# Patient Record
Sex: Female | Born: 1970 | Race: White | Hispanic: No | Marital: Married | Smoking: Never smoker
Health system: Southern US, Community
[De-identification: ages and names within clinical notes are randomized; demographics above are authoritative.]

## PROBLEM LIST (undated history)

## (undated) DIAGNOSIS — E785 Hyperlipidemia, unspecified: Secondary | ICD-10-CM

## (undated) DIAGNOSIS — I639 Cerebral infarction, unspecified: Secondary | ICD-10-CM

## (undated) DIAGNOSIS — I10 Essential (primary) hypertension: Secondary | ICD-10-CM

## (undated) HISTORY — DX: Hyperlipidemia, unspecified: E78.5

## (undated) HISTORY — DX: Essential (primary) hypertension: I10

## (undated) HISTORY — DX: Cerebral infarction, unspecified: I63.9

---

## 1997-08-20 ENCOUNTER — Ambulatory Visit (HOSPITAL_COMMUNITY): Admission: RE | Admit: 1997-08-20 | Discharge: 1997-08-21 | Payer: Self-pay | Admitting: General Surgery

## 1999-03-04 ENCOUNTER — Other Ambulatory Visit: Admission: RE | Admit: 1999-03-04 | Discharge: 1999-03-04 | Payer: Self-pay | Admitting: *Deleted

## 1999-12-30 ENCOUNTER — Ambulatory Visit (HOSPITAL_COMMUNITY): Admission: RE | Admit: 1999-12-30 | Discharge: 1999-12-30 | Payer: Self-pay | Admitting: *Deleted

## 2003-03-21 ENCOUNTER — Other Ambulatory Visit: Admission: RE | Admit: 2003-03-21 | Discharge: 2003-03-21 | Payer: Self-pay | Admitting: *Deleted

## 2003-07-08 ENCOUNTER — Emergency Department (HOSPITAL_COMMUNITY): Admission: EM | Admit: 2003-07-08 | Discharge: 2003-07-08 | Payer: Self-pay

## 2004-05-03 ENCOUNTER — Other Ambulatory Visit: Admission: RE | Admit: 2004-05-03 | Discharge: 2004-05-03 | Payer: Self-pay | Admitting: Obstetrics and Gynecology

## 2006-01-05 ENCOUNTER — Ambulatory Visit: Payer: Self-pay | Admitting: Gastroenterology

## 2006-01-13 ENCOUNTER — Ambulatory Visit: Payer: Self-pay | Admitting: Cardiology

## 2006-02-17 ENCOUNTER — Ambulatory Visit (HOSPITAL_COMMUNITY): Admission: RE | Admit: 2006-02-17 | Discharge: 2006-02-17 | Payer: Self-pay | Admitting: Obstetrics and Gynecology

## 2006-03-02 ENCOUNTER — Ambulatory Visit: Payer: Self-pay | Admitting: Gastroenterology

## 2006-09-12 ENCOUNTER — Ambulatory Visit: Payer: Self-pay | Admitting: Gastroenterology

## 2007-05-05 DIAGNOSIS — E538 Deficiency of other specified B group vitamins: Secondary | ICD-10-CM | POA: Insufficient documentation

## 2007-05-05 DIAGNOSIS — E7800 Pure hypercholesterolemia, unspecified: Secondary | ICD-10-CM | POA: Insufficient documentation

## 2007-05-05 DIAGNOSIS — Z8719 Personal history of other diseases of the digestive system: Secondary | ICD-10-CM | POA: Insufficient documentation

## 2008-06-19 DIAGNOSIS — E663 Overweight: Secondary | ICD-10-CM | POA: Insufficient documentation

## 2009-04-13 ENCOUNTER — Ambulatory Visit: Payer: Self-pay | Admitting: Family Medicine

## 2009-04-20 LAB — CONVERTED CEMR LAB
Hemoglobin: 12.4 g/dL (ref 12.0–15.0)
Lymphocytes Relative: 45.1 % (ref 12.0–46.0)
Lymphs Abs: 3.8 10*3/uL (ref 0.7–4.0)
MCHC: 34.1 g/dL (ref 30.0–36.0)
Neutro Abs: 4.1 10*3/uL (ref 1.4–7.7)
RDW: 12 % (ref 11.5–14.6)

## 2009-12-28 ENCOUNTER — Ambulatory Visit: Payer: Self-pay | Admitting: Family Medicine

## 2010-01-01 LAB — CONVERTED CEMR LAB
Albumin: 4.3 g/dL (ref 3.5–5.2)
Cholesterol: 222 mg/dL — ABNORMAL HIGH (ref 0–200)
Direct LDL: 154 mg/dL
Total Bilirubin: 0.2 mg/dL — ABNORMAL LOW (ref 0.3–1.2)
Total CHOL/HDL Ratio: 6
Triglycerides: 189 mg/dL — ABNORMAL HIGH (ref 0.0–149.0)
VLDL: 37.8 mg/dL (ref 0.0–40.0)

## 2010-01-28 ENCOUNTER — Telehealth: Payer: Self-pay | Admitting: Family Medicine

## 2010-03-04 NOTE — Progress Notes (Signed)
Summary: rx lomotil  Phone Note Call from Patient   Caller: Patient Summary of Call: requesting lomotil for diarrhea  Initial call taken by: Pura Spice, RN,  January 28, 2010 11:10 AM  Follow-up for Phone Call        ok per dr Scotty Court  called Follow-up by: Pura Spice, RN,  January 28, 2010 11:10 AM    New/Updated Medications: LOMOTIL 2.5-0.025 MG TABS (DIPHENOXYLATE-ATROPINE) 1 by mouth two times a day for diarrhea Prescriptions: LOMOTIL 2.5-0.025 MG TABS (DIPHENOXYLATE-ATROPINE) 1 by mouth two times a day for diarrhea  #100 x 1   Entered by:   Pura Spice, RN   Authorized by:   Judithann Sheen MD   Signed by:   Pura Spice, RN on 01/28/2010   Method used:   Telephoned to ...       [facility] [address] (retail)       [address]       [place], [place]  [address]       Ph: [phone]       Fax: [phone]   RxID:   [id number]

## 2010-06-15 NOTE — Assessment & Plan Note (Signed)
[facility]                         GASTROENTEROLOGY OFFICE NOTE   NAME:[name], MAHAYLA [name]                      MRN:          [id number]  DATE:09/12/2006                            DOB:          07/04/1970    [name] is doing well on Pamine twice a day and is really having minimal  GI complaints.  She continues to have some urgency and episodes of  diarrhea.  Her GI workup has been entirely negative, but she has not had  colonoscopy.  She is status post cholecystectomy in the year 2000.   PHYSICAL EXAMINATION:  VITAL SIGNS:  All stable.  General physical exam is not repeated.   ASSESSMENT:  I think most of her problems are related to rapid  intestinal transit and irritable bowel syndrome.  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  Cause for a B12 deficiency is unclear.   RECOMMENDATIONS:  1. Check anti-intrinsic factor and anti-parietal cell antibodies.  2. Continue B12 replacement therapy.  3. Trial of Colestid 1 g a day.  See if this helps with her GI      symptoms.  If so will ask her to try to taper her off of her Pamine      medication.  4. GI followup in several weeks' time or p.r.n. as needed.     Vania Rea. Jarold Motto, MD, Caleen Essex, FAGA  Electronically Signed    DRP/MedQ  DD: 09/12/2006  DT: 09/13/2006  Job #: [id number]

## 2010-06-18 NOTE — Op Note (Signed)
NAMETEREN, FRANCKOWIAK               ACCOUNT NO.:  [id number]   MEDICAL RECORD NO.:  [id number]          PATIENT TYPE:  AMB   LOCATION:  SDC                           FACILITY:  [facility]   PHYSICIAN:  Guy Sandifer. Henderson Cloud, M.D. DATE OF BIRTH:  18-Mar-1970   DATE OF PROCEDURE:  02/17/2006  DATE OF DISCHARGE:                               OPERATIVE REPORT   PREOPERATIVE DIAGNOSIS:  Dysmenorrhea.   POSTOPERATIVE DIAGNOSIS:  Dysmenorrhea.   PROCEDURE:  Laparoscopy.   SURGEON:  Guy Sandifer. Henderson Cloud, M.D.   ANESTHESIA:  General endotracheal intubation.   ESTIMATED BLOOD LOSS:  Drops.   INDICATIONS AND CONSENT:  This patient is a 40 year old married white  female G2, P2, status post tubal ligation with increasingly severe  premenstrual pain and pain with her menses.  Details are dictated in the  history and physical.  Laparoscopy is discussed preoperatively.  Potential risks and complications are reviewed preoperatively including  but limited to infection, bowel, bladder, ureteral damage, bleeding  requiring transfusion of blood products with possible transfusion  reaction, HIV and hepatitis acquisition, DVT, PE, pneumonia, laparotomy,  recurrent pelvic pain and dyspareunia.  All questions have been answered  and consent is signed on the chart.   FINDINGS:  Upper abdomen is grossly normal.  Uterus is normal in size  and contour.  Tubes are status post ligation bilaterally.  Ovaries are  normal.  Anterior and posterior cul-de-sacs and pelvic sidewalls are  normal.   PROCEDURE:  The patient is taken to the operating room where she is  identified, placed dorsal supine position and general anesthesia is  induced via endotracheal intubation.  She is then placed in dorsal  lithotomy position where she is prepped abdominally and vaginally and  bladder straight catheterized.  Hulka tenaculum is placed in uterus as a  manipulated and she is draped in a sterile fashion.  Infraumbilical and  suprapubic  areas are injected in the midline with 0.50% plain Marcaine.  Small infraumbilical incision is made and disposable Veress needle is  placed.  A normal syringe and drop test are noted.  Two  liters of gas  are insufflated under low pressure with good tympany in the right upper  quadrant.  Veress needle is removed and a 10-11 XL bladeless disposable  trocar sleeve is placed under direct visualization with a diagnostic  laparoscopic.  Diagnostic laparoscopic is then replaced with the  operative laparoscopic.  Small suprapubic incision is made in the  midline and a 5 mm XL bladeless disposable trocar sleeve is  placed  under direct visualization without difficulty.  The above findings are  noted.  Suprapubic trocar sleeve is removed and good hemostasis is  noted.  Pneumoperitoneum is reduced.  Umbilical trocar sleeve is  removed.  The umbilical incision is closed with subcuticular 3-0 Vicryl  suture.  Dermabond is placed on both incisions.  Hulka tenaculum is  removed and no bleeding is noted.  All counts are correct.  The patient  is awakened and taken to the recovery room in stable condition.      Guy Sandifer Henderson Cloud, M.D.  Electronically Signed     JET/MEDQ  D:  02/17/2006  T:  02/17/2006  Job:  [id number]

## 2010-06-18 NOTE — H&P (Signed)
[name], [name]               ACCOUNT NO.:  [id number]   MEDICAL RECORD NO.:  [id number]          PATIENT TYPE:  AMB   LOCATION:  SDC                           [facility]ILITY:  [facility]   PHYSICIAN:  Guy Sandifer. Henderson Cloud, M.D. DATE OF BIRTH:  [birth year]   DATE OF ADMISSION:  02/17/2006  DATE OF DISCHARGE:                              HISTORY & PHYSICAL   CHIEF COMPLAINT:  Pelvic pain with her menses.   HISTORY OF PRESENT ILLNESS:  This patient is a 40 year old married white  female, gravida 2, para 2 status post tubal ligation with increasingly  severe premenstrual pain and pain with her menses.  The pain starts one  to two weeks prior to her menses each month.  She also has loose stools  and stomach cramps with her menses.  After a discussion of the options,  she is being admitted for laparoscopy.  Potential risks and  complications have been discussed preoperatively.   PAST MEDICAL HISTORY:  Migraine headaches.   PAST SURGICAL HISTORY:  1. Cholecystectomy in 1999.  2. Laparoscopic tubal ligation in 2001.   FAMILY HISTORY:  Diabetes in mother and brother.  Breast cancer in  paternal grandmother.  Chronic hypertension in both parents.  Heart  disease in father.  Rheumatic fever in father.   SOCIAL HISTORY:  The patient denies tobacco, alcohol or drug abuse.   MEDICATIONS:  1. B-12 supplementation  2. Unknown GI medication   ALLERGIES:  No known drug allergies.   REVIEW OF SYSTEMS:  NEUROLOGIC: History of headaches as above.  GASTROINTESTINAL: Denies recent changes in bowel habits except as above.  PULMONARY: Denies shortness of breath.  CARDIAC: Denies chest pain.   PHYSICAL EXAMINATION:  VITAL SIGNS: Height 5 feet 3 inches, weight 185.8  pounds.  Blood pressure 138/90.  HEENT: Without thyromegaly.  LUNGS: Clear to auscultation.  HEART: Regular rate and rhythm.  BACK: Without costovertebral angle tenderness.  BREASTS: Without masses, retractions,  or discharge.  ABDOMEN: Soft,  nontender, without masses.  PELVIC: Both vagina and cervix without lesions.  Uterus is anteverted  upper normal size, mobile, nontender.  Adnexa nontender and without  masses.  EXTREMITIES: Grossly within normal limits.  NEUROLOGICAL: Grossly within normal limits.   ASSESSMENT:  Premenstrual pain and dysmenorrhea.   PLAN:  Laparoscopy.      Guy Sandifer Henderson Cloud, M.D.  Electronically Signed     JET/MEDQ  D:  02/15/2006  T:  02/15/2006  Job:  [id number]

## 2010-06-18 NOTE — H&P (Signed)
[facility]  Patient:    [name]                      MRN: [id number] Adm. Date:  [id number] Attending:  Donne Hazel                         History and Physical  HISTORY OF PRESENT ILLNESS:   [name] is a 40 year old female, gravida 2, para 2, admitted for permanent tubal sterilization.  The patient was thoroughly regarding the risks and benefits of permanent sterilization and strongly wished to proceed.  She is currently on oral contraception.  She has had two term vaginal deliveries and at this time requests permanent tubal sterilization.  PAST MEDICAL HISTORY:         None.  PAST SURGICAL HISTORY:        Cholecystectomy in 1999.  OBSTETRICAL HISTORY:          Normal spontaneous vaginal delivery x 2 at term.  CURRENT MEDICATIONS:          Ortho Tri-Cyclen.  ALLERGIES:                    None known.  PHYSICAL EXAMINATION:         Vital signs stable, temperature 97.2 degrees, pulse 75, respirations 20, blood pressure 133/77.  GENERAL APPEARANCE:           She is a well-developed, well-nourished female in no acute distress.  HEENT:                        Within normal limits.  NECK:                         Supple without adenopathy or thyromegaly.  HEART:                        Regular rate and rhythm without murmurs, rubs, or gallops.  LUNGS:                        Clear to auscultation.  BREASTS:                      Exam done recently in the office was normal with no masses, nipple discharge, or axillary adenopathy.  However, this was deferred upon admission.  ABDOMEN:                      Soft and nontender without masses, tenderness, hernia, or organomegaly.  EXTREMITIES:                  Grossly normal.  NEUROLOGIC:                   Grossly normal.  PELVIC:                       Normal external female genitalia.  Vagina and cervix clear.  The uterus is small, nontender, and mobile.  The adnexa  are clear.  LABORATORY DATA:              Urine pregnancy test negative.  ADMISSION DIAGNOSIS:          Requests permanent, voluntary sterilization.  PLAN:  Laparoscopic bilateral tubal ligation.  DISCUSSION:                   The risks and benefits of this surgery were explained to the patient.  The risks of bleeding, infection, and risks of injury to surrounding organs reviewed.  Also, failure rate of 1:200 discussed with the patient at length.  The patient was allowed to ask questions and wished to proceed with tubal ligation. DD:  12/30/99 TD:  12/30/99 Job: [id number]

## 2010-06-18 NOTE — Assessment & Plan Note (Signed)
[facility]                            CARDIOLOGY OFFICE NOTE   NAME:[name], [name]                      MRN:          [id number]  DATE:01/13/2006                            DOB:          [birth year]    I was asked by Dr. Harold Hedge to evaluate [name], a delightful  40 year old married white female with hyperlipidemia, obesity, sedentary  lifestyle and a family history of coronary disease in both her mother  and father.   She is totally asymptomatic.  She has been very fatigued and is  currently receiving B12 injections for what sounds like pernicious  anemia or B12 deficiency.  She says she is already feeling better.  She  is ready to start an exercise program.   She is asymptomatic at present.   Lipids from May 14, 2004, showed a total cholesterol of 211, LDLC of  153, HDL 32, triglycerides of 131.  The LDL particles were very high at  2288, small LDL particles high at 1527, LDL particle size large at 20.7,  large HDL was intermediate range.  Her fasting blood sugar at that time  was normal, hemoglobin A1c was 5.4.  I do not have more recent values.   Her mother and father both have coronary disease in their 67's.  Her  mother specifically had an MI at age 56.   PAST MEDICAL HISTORY:  She does not smoke, drink or use any recreational  drugs.   CURRENT MEDS:  1. Pamine Forte 5 mg.  2. B12 injection.   SURGERY:  Cholecystectomy.   SOCIAL HISTORY:  She is a CMA medical assist.  She is married and has 2  children.   REVIEW OF SYSTEMS:  Negative other than problems with allergies and hay  fever, chronic anemia and menstrual dysfunction.   EXAM TODAY:  She is very pleasant.  Her blood pressure is 124/84, pulse  is 84 and regular.  She is 5 feet 3 inches, weighs 188.  She is in no  acute distress.  Skin is warm and dry.  Skin is slightly pale.  HEENT:  Normocephalic, atraumatic.  PERRLA.  Extraocular movements  intact.  Sclera  clear.  Facial symmetry is normal.  Dentition is  satisfactory.  Carotids are full without bruits.  There is no JVD.  Thyroid is not enlarged.  Trachea is midline.  NECK:  Supple.  LUNGS:  Clear to auscultation and percussion.  HEART:  Reveals a regular rate and rhythm with a nondisplaced PMI.  She  has normal S1, S2.  ABDOMINAL EXAM:  Soft with good bowel sounds.  EXTREMITIES:  No cyanosis, clubbing or edema.  Pulses are brisk.  NEURO EXAM:  Intact.  MUSCULOSKELETAL:  Intact.  SKIN:  Intact.   Electrocardiogram is normal except for an RSR prime in V1 consistent  with an incomplete right bundle.   ASSESSMENT AND PLAN:  Because of her young age, she is still at very low  risk of having a cardiovascular event in the next 10 years.  However, I  emphasized to her that I think she is really  headed down the wrong road  for future events.  With her family history being a major factor, her  hyperlipidemia and her weight and her sedentary lifestyle become more  and more factors as she gets older.   She seems very motivated and wants to get into an exercise program.  I  have recommended the following:  1. Three hours of cardio activity each week.  2. Weight reduction of 4 pounds per month for a total of 30 pounds and      maintain.  3. See me back in 6 months.  At that time will repeat labs and decide      if she needs hyperlipidemic therapy.   She seems very comfortable with this approach.     Thomas C. Daleen Squibb, MD, [facility]  Electronically Signed    TCW/MedQ  DD: 01/13/2006  DT: 01/13/2006  Job #: [id number]   cc:   Guy Sandifer. Henderson Cloud, M.D.

## 2010-06-18 NOTE — Assessment & Plan Note (Signed)
[facility]                         GASTROENTEROLOGY OFFICE NOTE   NAME:Mesick, MADALENE MICKLER                      MRN:          [id number]  DATE:01/05/2006                            DOB:          07/22/1970    HISTORY OF PRESENT ILLNESS:  Ms. 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   Over the last year, Ms. Gasca has had a crampy lower abdominal pain,  mostly in the right lower quadrant with explosive diarrhea, usually  after her main meal of the day at lunch.  She has been using Imodium  with good response.  She has some other scattered attacks of minor  abdominal cramping and diarrhea throughout the day, but has had no  nocturnal diarrhea, self soiling, melena or hematochezia, anorexia,  weight loss or any systemic complaints whatsoever.  She has tried  elimination diets and has not noticed any specific food substance which  gives her diarrhea.  Denies lactose intolerance or history of sorbitol  or fructose abuse.   In 2000, she had an incidental cholecystectomy performed.  Apparently  had diarrhea after that and was placed on a low fat diet by Dr. Karma Ganja with good response.  She has not been on recent antibiotics or  had any new medication added to her regimen.  She does have  endometriosis and has painful lower abdominal pain before her menstrual  periods.  Is due for laparoscopic exam this coming January.  This pain  does not awaken her from sleep.  There has been no associated fever,  chills, skin rashes, joint pains, oral stomatitis, etc, etc.  She has  not had stool exams or any laboratory tests or any endoscopic or barium  studies of her bowels performed.  She is of [place] descent.   PAST MEDICAL HISTORY:  Otherwise, remarkable for chronic headaches for  which she uses over-the-counter NSAIDS.  She has had a previous tubal  ligation.   FAMILY  HISTORY:  Remarkable for atherosclerosis of multiple members.  Paternal grandmother with breast cancer, and multiple members with  diabetes.  No known gastrointestinal problems.   SOCIAL HISTORY:  She is married, lives with her husband and two  children.  She works as Clinical biochemist at [employer].  She does not  smoke or use Ethanol.   REVIEW OF SYSTEMS:  Otherwise, noncontributory.  Her last menstrual  period was December 08, 2005.   MEDICATIONS:  1. Multivitamins.  2. Calcium.  3. She has been off birth control pills for the last 6-7 years.   PHYSICAL EXAMINATION:  GENERAL:  She is a healthy appearing white female  in no acute distress.  She is 5 feet 3 inches tall, weighs 186 pounds.  VITAL SIGNS:  Blood pressure 122/80, pulse 82 and regular.  I could not  appreciate stigmata or chronic liver disease, and there was no  thyromegaly or lymphadenopathy.  CHEST:  Entirely clear.  I could not appreciate murmurs, gallops or  rubs.  She appeared to be in irregular rhythm.  ABDOMEN:  I could not appreciate hepatosplenomegaly, abdominal masses or  tenderness.  Bowel sounds were normal.  EXTREMITIES:  Unremarkable.  NEUROLOGICAL:  Mental status was clear.  RECTAL:  No masses or tenderness.  Stool is guaiac negative.   ASSESSMENT:  Ms. Enlow has diarrhea which sounds like a colitic  process.  Diagnostic possibilities include irritable bowel syndrome  versus inflammatory bowel disease.   RECOMMENDATIONS:  1. Check routine labs and stool exams.  2. Trial of Robinul Forte 5 mg 30 minutes before main meal of the day      and every 12 hours as needed.  3. GI follow up in two weeks time to review her results and clinical      course, decide if colonoscopy is indicated.     Vania Rea. Jarold Motto, MD, [facility]aleen Essex, FAGA  Electronically Signed    DRP/MedQ  DD: 01/05/2006  DT: 01/05/2006  Job #: [id number]   cc:   Guy Sandifer. Henderson Cloud, M.D.

## 2010-06-18 NOTE — Op Note (Signed)
[facility]  Patient:    [name], [name]                      MRN: [id number] Proc. Date: 12/30/99 Adm. Date:  [id number] Attending:  Donne Hazel                           Operative Report  PREOPERATIVE DIAGNOSIS:  Requests permanent, voluntary sterilization.  POSTOPERATIVE DIAGNOSIS:  Requests permanent, voluntary sterilization.  OPERATION:  Laparoscopic bilateral tubal ligation by electrofulguration  SURGEON:  R. Alan Mulder, M.D.  ANESTHESIA:  General endotracheal.  ESTIMATED BLOOD LOSS:  Less than 20 cc.  COMPLICATIONS:  None.  FINDINGS:  Normal pelvis.  DESCRIPTION OF PROCEDURE:  The patient was taken to the operating room where a general endotracheal anesthetic was administered.  The patient was placed on the operating table in the dorsal lithotomy position.  The abdomen, perineum and vagina was prepped and draped in the usual sterile fashion with Betadine and sterile drapes.  The bladder was emptied with a red rubber catheter.  A Hulka tenaculum was placed inside the intrauterine cavity.  Next, small transverse infraumbilical skin incision was made through which a Verres needle was inserted atraumatically into the abdominal cavity.  A pneumoperitoneum was then created with 2.5 liters of carbon dioxide gas.  The Verres needle was then removed, and a disposable laparoscopic trocar was inserted into the abdominal cavity.  A laparoscope was then placed with the above noted findings.  First, the left tube was traced to its fimbriated end to ensure its positive identification.  The tube was then grasped approximately 2 cm away from the uterine fundus and cauterized thoroughly.  An approximately 2.5 cm segment of tube was thoroughly cauterized with some of the mesosalpinx being incorporated into this cauterization process.  This was done thoroughly with no current being passed through the paddles of the _________ device.  The same  procedure was then repeated on the right tube.  Good hemostasis was noted.  Representative pictures were then taken of bilateral tubal ligation.  Attention was then turned to closure.  All abdominal instruments were removed and the gas allowed to completely escape from the abdomen.  The small infraumbilical skin incision was closed first with three interrupted sutures of 0 Vicryl on the muscle fascia.  The skin was then reapproximated with three interrupted sutures of 4-0 Vicryl Rapide.  Then 10 cc of 0.25% Marcaine was then infiltrated into the incision for comfort.  All vaginal instruments were removed.  The patient was awakened, extubated and taken to the recovery room in good condition.  There were no perioperative conditions. DD:  12/30/99 TD:  12/30/99 Job: [id number]

## 2012-04-04 ENCOUNTER — Ambulatory Visit
Admission: RE | Admit: 2012-04-04 | Discharge: 2012-04-04 | Disposition: A | Discharge status: Home / Self Care | Payer: Federal, State, Local not specified - PPO | Source: Ambulatory Visit | Attending: Family Medicine | Admitting: Family Medicine

## 2012-04-04 ENCOUNTER — Other Ambulatory Visit: Payer: Self-pay | Admitting: Family Medicine

## 2012-04-04 DIAGNOSIS — R05 Cough: Secondary | ICD-10-CM

## 2012-04-04 DIAGNOSIS — R059 Cough, unspecified: Secondary | ICD-10-CM

## 2015-08-14 DIAGNOSIS — E785 Hyperlipidemia, unspecified: Secondary | ICD-10-CM | POA: Diagnosis not present

## 2015-08-14 DIAGNOSIS — D519 Vitamin B12 deficiency anemia, unspecified: Secondary | ICD-10-CM | POA: Diagnosis not present

## 2015-08-14 DIAGNOSIS — E119 Type 2 diabetes mellitus without complications: Secondary | ICD-10-CM | POA: Diagnosis not present

## 2015-08-14 DIAGNOSIS — I10 Essential (primary) hypertension: Secondary | ICD-10-CM | POA: Diagnosis not present

## 2015-11-13 DIAGNOSIS — Z01419 Encounter for gynecological examination (general) (routine) without abnormal findings: Secondary | ICD-10-CM | POA: Diagnosis not present

## 2015-11-13 DIAGNOSIS — Z6834 Body mass index (BMI) 34.0-34.9, adult: Secondary | ICD-10-CM | POA: Diagnosis not present

## 2015-11-23 DIAGNOSIS — Z1231 Encounter for screening mammogram for malignant neoplasm of breast: Secondary | ICD-10-CM | POA: Diagnosis not present

## 2016-05-12 DIAGNOSIS — Z0000 Encounter for general adult medical examination without abnormal findings: Secondary | ICD-10-CM | POA: Diagnosis not present

## 2016-05-12 DIAGNOSIS — D519 Vitamin B12 deficiency anemia, unspecified: Secondary | ICD-10-CM | POA: Diagnosis not present

## 2016-05-12 DIAGNOSIS — R5383 Other fatigue: Secondary | ICD-10-CM | POA: Diagnosis not present

## 2016-05-12 DIAGNOSIS — E119 Type 2 diabetes mellitus without complications: Secondary | ICD-10-CM | POA: Diagnosis not present

## 2016-05-12 DIAGNOSIS — E785 Hyperlipidemia, unspecified: Secondary | ICD-10-CM | POA: Diagnosis not present

## 2016-05-12 DIAGNOSIS — I10 Essential (primary) hypertension: Secondary | ICD-10-CM | POA: Diagnosis not present

## 2016-08-11 DIAGNOSIS — E559 Vitamin D deficiency, unspecified: Secondary | ICD-10-CM | POA: Diagnosis not present

## 2017-01-12 DIAGNOSIS — E119 Type 2 diabetes mellitus without complications: Secondary | ICD-10-CM | POA: Diagnosis not present

## 2017-01-12 DIAGNOSIS — I10 Essential (primary) hypertension: Secondary | ICD-10-CM | POA: Diagnosis not present

## 2017-01-12 DIAGNOSIS — E559 Vitamin D deficiency, unspecified: Secondary | ICD-10-CM | POA: Diagnosis not present

## 2017-01-12 DIAGNOSIS — E785 Hyperlipidemia, unspecified: Secondary | ICD-10-CM | POA: Diagnosis not present

## 2017-04-06 DIAGNOSIS — Z01419 Encounter for gynecological examination (general) (routine) without abnormal findings: Secondary | ICD-10-CM | POA: Diagnosis not present

## 2017-04-06 DIAGNOSIS — Z1231 Encounter for screening mammogram for malignant neoplasm of breast: Secondary | ICD-10-CM | POA: Diagnosis not present

## 2017-04-06 DIAGNOSIS — Z6834 Body mass index (BMI) 34.0-34.9, adult: Secondary | ICD-10-CM | POA: Diagnosis not present

## 2017-04-06 DIAGNOSIS — Z780 Asymptomatic menopausal state: Secondary | ICD-10-CM | POA: Diagnosis not present

## 2017-05-23 DIAGNOSIS — J029 Acute pharyngitis, unspecified: Secondary | ICD-10-CM | POA: Diagnosis not present

## 2017-08-31 DIAGNOSIS — E7800 Pure hypercholesterolemia, unspecified: Secondary | ICD-10-CM | POA: Diagnosis not present

## 2017-08-31 DIAGNOSIS — E119 Type 2 diabetes mellitus without complications: Secondary | ICD-10-CM | POA: Diagnosis not present

## 2017-08-31 DIAGNOSIS — I10 Essential (primary) hypertension: Secondary | ICD-10-CM | POA: Diagnosis not present

## 2017-08-31 DIAGNOSIS — D519 Vitamin B12 deficiency anemia, unspecified: Secondary | ICD-10-CM | POA: Diagnosis not present

## 2018-07-23 DIAGNOSIS — G43909 Migraine, unspecified, not intractable, without status migrainosus: Secondary | ICD-10-CM | POA: Diagnosis not present

## 2018-07-23 DIAGNOSIS — E7800 Pure hypercholesterolemia, unspecified: Secondary | ICD-10-CM | POA: Diagnosis not present

## 2018-07-23 DIAGNOSIS — I10 Essential (primary) hypertension: Secondary | ICD-10-CM | POA: Diagnosis not present

## 2018-07-23 DIAGNOSIS — E119 Type 2 diabetes mellitus without complications: Secondary | ICD-10-CM | POA: Diagnosis not present

## 2018-12-10 DIAGNOSIS — Z01419 Encounter for gynecological examination (general) (routine) without abnormal findings: Secondary | ICD-10-CM | POA: Diagnosis not present

## 2018-12-10 DIAGNOSIS — Z1231 Encounter for screening mammogram for malignant neoplasm of breast: Secondary | ICD-10-CM | POA: Diagnosis not present

## 2018-12-10 DIAGNOSIS — Z6833 Body mass index (BMI) 33.0-33.9, adult: Secondary | ICD-10-CM | POA: Diagnosis not present

## 2019-01-23 ENCOUNTER — Telehealth: Payer: Self-pay | Admitting: *Deleted

## 2019-01-23 NOTE — Telephone Encounter (Signed)
[name] from [facility] called for patient;s address.

## 2019-05-20 DIAGNOSIS — I10 Essential (primary) hypertension: Secondary | ICD-10-CM | POA: Diagnosis not present

## 2019-05-20 DIAGNOSIS — E119 Type 2 diabetes mellitus without complications: Secondary | ICD-10-CM | POA: Diagnosis not present

## 2019-05-20 DIAGNOSIS — G43909 Migraine, unspecified, not intractable, without status migrainosus: Secondary | ICD-10-CM | POA: Diagnosis not present

## 2019-05-20 DIAGNOSIS — E7800 Pure hypercholesterolemia, unspecified: Secondary | ICD-10-CM | POA: Diagnosis not present

## 2019-11-25 DIAGNOSIS — I10 Essential (primary) hypertension: Secondary | ICD-10-CM | POA: Diagnosis not present

## 2019-11-25 DIAGNOSIS — E559 Vitamin D deficiency, unspecified: Secondary | ICD-10-CM | POA: Diagnosis not present

## 2019-11-25 DIAGNOSIS — Z0000 Encounter for general adult medical examination without abnormal findings: Secondary | ICD-10-CM | POA: Diagnosis not present

## 2019-11-25 DIAGNOSIS — E7800 Pure hypercholesterolemia, unspecified: Secondary | ICD-10-CM | POA: Diagnosis not present

## 2019-11-25 DIAGNOSIS — E119 Type 2 diabetes mellitus without complications: Secondary | ICD-10-CM | POA: Diagnosis not present

## 2019-11-25 DIAGNOSIS — Z23 Encounter for immunization: Secondary | ICD-10-CM | POA: Diagnosis not present

## 2020-04-06 DIAGNOSIS — Z01812 Encounter for preprocedural laboratory examination: Secondary | ICD-10-CM | POA: Diagnosis not present

## 2020-04-09 DIAGNOSIS — Z1211 Encounter for screening for malignant neoplasm of colon: Secondary | ICD-10-CM | POA: Diagnosis not present

## 2020-04-20 DIAGNOSIS — Z01419 Encounter for gynecological examination (general) (routine) without abnormal findings: Secondary | ICD-10-CM | POA: Diagnosis not present

## 2020-04-20 DIAGNOSIS — Z1231 Encounter for screening mammogram for malignant neoplasm of breast: Secondary | ICD-10-CM | POA: Diagnosis not present

## 2020-10-19 DIAGNOSIS — E119 Type 2 diabetes mellitus without complications: Secondary | ICD-10-CM | POA: Diagnosis not present

## 2020-10-19 DIAGNOSIS — I10 Essential (primary) hypertension: Secondary | ICD-10-CM | POA: Diagnosis not present

## 2020-10-19 DIAGNOSIS — E7800 Pure hypercholesterolemia, unspecified: Secondary | ICD-10-CM | POA: Diagnosis not present

## 2020-10-19 DIAGNOSIS — G43909 Migraine, unspecified, not intractable, without status migrainosus: Secondary | ICD-10-CM | POA: Diagnosis not present

## 2021-01-23 ENCOUNTER — Emergency Department (HOSPITAL_COMMUNITY): Payer: Federal, State, Local not specified - PPO

## 2021-01-23 ENCOUNTER — Other Ambulatory Visit: Payer: Self-pay

## 2021-01-23 ENCOUNTER — Emergency Department (HOSPITAL_COMMUNITY): Payer: Federal, State, Local not specified - PPO | Admitting: Registered Nurse

## 2021-01-23 ENCOUNTER — Encounter (HOSPITAL_COMMUNITY)
Admission: EM | Disposition: A | Discharge status: Home / Self Care | Payer: Self-pay | Source: Home / Self Care | Attending: Neurology

## 2021-01-23 ENCOUNTER — Inpatient Hospital Stay (HOSPITAL_COMMUNITY): Payer: Federal, State, Local not specified - PPO

## 2021-01-23 ENCOUNTER — Inpatient Hospital Stay (HOSPITAL_COMMUNITY)
Admission: EM | Admit: 2021-01-23 | Discharge: 2021-01-29 | DRG: 023 | Disposition: A | Discharge status: Home / Self Care | Payer: Federal, State, Local not specified - PPO | Attending: Neurology | Admitting: Neurology

## 2021-01-23 DIAGNOSIS — R0689 Other abnormalities of breathing: Secondary | ICD-10-CM | POA: Diagnosis not present

## 2021-01-23 DIAGNOSIS — G8191 Hemiplegia, unspecified affecting right dominant side: Secondary | ICD-10-CM | POA: Diagnosis present

## 2021-01-23 DIAGNOSIS — I6523 Occlusion and stenosis of bilateral carotid arteries: Secondary | ICD-10-CM | POA: Diagnosis not present

## 2021-01-23 DIAGNOSIS — E669 Obesity, unspecified: Secondary | ICD-10-CM | POA: Diagnosis present

## 2021-01-23 DIAGNOSIS — Z789 Other specified health status: Secondary | ICD-10-CM

## 2021-01-23 DIAGNOSIS — R29810 Facial weakness: Secondary | ICD-10-CM | POA: Diagnosis present

## 2021-01-23 DIAGNOSIS — I63512 Cerebral infarction due to unspecified occlusion or stenosis of left middle cerebral artery: Secondary | ICD-10-CM

## 2021-01-23 DIAGNOSIS — Z95828 Presence of other vascular implants and grafts: Secondary | ICD-10-CM | POA: Diagnosis not present

## 2021-01-23 DIAGNOSIS — Z832 Family history of diseases of the blood and blood-forming organs and certain disorders involving the immune mechanism: Secondary | ICD-10-CM

## 2021-01-23 DIAGNOSIS — I10 Essential (primary) hypertension: Secondary | ICD-10-CM | POA: Diagnosis not present

## 2021-01-23 DIAGNOSIS — I82431 Acute embolism and thrombosis of right popliteal vein: Secondary | ICD-10-CM | POA: Diagnosis present

## 2021-01-23 DIAGNOSIS — Z833 Family history of diabetes mellitus: Secondary | ICD-10-CM | POA: Diagnosis not present

## 2021-01-23 DIAGNOSIS — R220 Localized swelling, mass and lump, head: Secondary | ICD-10-CM | POA: Diagnosis not present

## 2021-01-23 DIAGNOSIS — I609 Nontraumatic subarachnoid hemorrhage, unspecified: Secondary | ICD-10-CM | POA: Diagnosis not present

## 2021-01-23 DIAGNOSIS — T45515A Adverse effect of anticoagulants, initial encounter: Secondary | ICD-10-CM | POA: Diagnosis not present

## 2021-01-23 DIAGNOSIS — R29818 Other symptoms and signs involving the nervous system: Secondary | ICD-10-CM | POA: Diagnosis not present

## 2021-01-23 DIAGNOSIS — I82462 Acute embolism and thrombosis of left calf muscular vein: Secondary | ICD-10-CM | POA: Diagnosis not present

## 2021-01-23 DIAGNOSIS — R739 Hyperglycemia, unspecified: Secondary | ICD-10-CM | POA: Diagnosis not present

## 2021-01-23 DIAGNOSIS — I6602 Occlusion and stenosis of left middle cerebral artery: Secondary | ICD-10-CM | POA: Diagnosis present

## 2021-01-23 DIAGNOSIS — Z20822 Contact with and (suspected) exposure to covid-19: Secondary | ICD-10-CM | POA: Diagnosis present

## 2021-01-23 DIAGNOSIS — R404 Transient alteration of awareness: Secondary | ICD-10-CM | POA: Diagnosis not present

## 2021-01-23 DIAGNOSIS — I639 Cerebral infarction, unspecified: Secondary | ICD-10-CM | POA: Diagnosis present

## 2021-01-23 DIAGNOSIS — I82463 Acute embolism and thrombosis of calf muscular vein, bilateral: Secondary | ICD-10-CM | POA: Diagnosis not present

## 2021-01-23 DIAGNOSIS — I6389 Other cerebral infarction: Secondary | ICD-10-CM | POA: Diagnosis not present

## 2021-01-23 DIAGNOSIS — R29718 NIHSS score 18: Secondary | ICD-10-CM | POA: Diagnosis not present

## 2021-01-23 DIAGNOSIS — Z823 Family history of stroke: Secondary | ICD-10-CM

## 2021-01-23 DIAGNOSIS — E1165 Type 2 diabetes mellitus with hyperglycemia: Secondary | ICD-10-CM | POA: Diagnosis present

## 2021-01-23 DIAGNOSIS — Z6830 Body mass index (BMI) 30.0-30.9, adult: Secondary | ICD-10-CM

## 2021-01-23 DIAGNOSIS — I63412 Cerebral infarction due to embolism of left middle cerebral artery: Principal | ICD-10-CM | POA: Diagnosis present

## 2021-01-23 DIAGNOSIS — Q2112 Patent foramen ovale: Secondary | ICD-10-CM | POA: Diagnosis not present

## 2021-01-23 DIAGNOSIS — R531 Weakness: Secondary | ICD-10-CM

## 2021-01-23 DIAGNOSIS — I82403 Acute embolism and thrombosis of unspecified deep veins of lower extremity, bilateral: Secondary | ICD-10-CM

## 2021-01-23 DIAGNOSIS — R4701 Aphasia: Secondary | ICD-10-CM | POA: Diagnosis not present

## 2021-01-23 DIAGNOSIS — E785 Hyperlipidemia, unspecified: Secondary | ICD-10-CM | POA: Diagnosis present

## 2021-01-23 DIAGNOSIS — E11649 Type 2 diabetes mellitus with hypoglycemia without coma: Secondary | ICD-10-CM | POA: Diagnosis not present

## 2021-01-23 DIAGNOSIS — E876 Hypokalemia: Secondary | ICD-10-CM | POA: Diagnosis not present

## 2021-01-23 DIAGNOSIS — I63312 Cerebral infarction due to thrombosis of left middle cerebral artery: Secondary | ICD-10-CM | POA: Diagnosis not present

## 2021-01-23 DIAGNOSIS — M419 Scoliosis, unspecified: Secondary | ICD-10-CM | POA: Diagnosis not present

## 2021-01-23 DIAGNOSIS — E7800 Pure hypercholesterolemia, unspecified: Secondary | ICD-10-CM | POA: Diagnosis not present

## 2021-01-23 DIAGNOSIS — I82433 Acute embolism and thrombosis of popliteal vein, bilateral: Secondary | ICD-10-CM | POA: Diagnosis not present

## 2021-01-23 HISTORY — PX: IR PERCUTANEOUS ART THROMBECTOMY/INFUSION INTRACRANIAL INC DIAG ANGIO: IMG6087

## 2021-01-23 HISTORY — PX: RADIOLOGY WITH ANESTHESIA: SHX6223

## 2021-01-23 HISTORY — PX: IR CT HEAD LTD: IMG2386

## 2021-01-23 LAB — RESP PANEL BY RT-PCR (FLU A&B, COVID) ARPGX2
Influenza A by PCR: NEGATIVE
Influenza B by PCR: NEGATIVE
SARS Coronavirus 2 by RT PCR: NEGATIVE

## 2021-01-23 LAB — POCT I-STAT 7, (LYTES, BLD GAS, ICA,H+H)
Acid-base deficit: 2 mmol/L (ref 0.0–2.0)
Bicarbonate: 24 mmol/L (ref 20.0–28.0)
Calcium, Ion: 1.18 mmol/L (ref 1.15–1.40)
HCT: 34 % — ABNORMAL LOW (ref 36.0–46.0)
Hemoglobin: 11.6 g/dL — ABNORMAL LOW (ref 12.0–15.0)
O2 Saturation: 97 %
Patient temperature: 98.9
Potassium: 3.3 mmol/L — ABNORMAL LOW (ref 3.5–5.1)
Sodium: 140 mmol/L (ref 135–145)
TCO2: 25 mmol/L (ref 22–32)
pCO2 arterial: 44.4 mmHg (ref 32.0–48.0)
pH, Arterial: 7.341 — ABNORMAL LOW (ref 7.350–7.450)
pO2, Arterial: 103 mmHg (ref 83.0–108.0)

## 2021-01-23 LAB — DIFFERENTIAL
Abs Immature Granulocytes: 0.05 10*3/uL (ref 0.00–0.07)
Basophils Absolute: 0.1 10*3/uL (ref 0.0–0.1)
Basophils Relative: 1 %
Eosinophils Absolute: 0.1 10*3/uL (ref 0.0–0.5)
Eosinophils Relative: 1 %
Immature Granulocytes: 1 %
Lymphocytes Relative: 45 %
Lymphs Abs: 4.9 10*3/uL — ABNORMAL HIGH (ref 0.7–4.0)
Monocytes Absolute: 0.7 10*3/uL (ref 0.1–1.0)
Monocytes Relative: 6 %
Neutro Abs: 4.9 10*3/uL (ref 1.7–7.7)
Neutrophils Relative %: 46 %

## 2021-01-23 LAB — APTT: aPTT: 24 seconds (ref 24–36)

## 2021-01-23 LAB — CBC
HCT: 39.6 % (ref 36.0–46.0)
Hemoglobin: 13.5 g/dL (ref 12.0–15.0)
MCH: 31.8 pg (ref 26.0–34.0)
MCHC: 34.1 g/dL (ref 30.0–36.0)
MCV: 93.4 fL (ref 80.0–100.0)
Platelets: 236 K/uL (ref 150–400)
RBC: 4.24 MIL/uL (ref 3.87–5.11)
RDW: 11.6 % (ref 11.5–15.5)
WBC: 10.7 K/uL — ABNORMAL HIGH (ref 4.0–10.5)
nRBC: 0 % (ref 0.0–0.2)

## 2021-01-23 LAB — PROTIME-INR
INR: 1 (ref 0.8–1.2)
Prothrombin Time: 12.9 seconds (ref 11.4–15.2)

## 2021-01-23 LAB — COMPREHENSIVE METABOLIC PANEL
ALT: 26 U/L (ref 0–44)
AST: 25 U/L (ref 15–41)
Albumin: 3.9 g/dL (ref 3.5–5.0)
Alkaline Phosphatase: 69 U/L (ref 38–126)
Anion gap: 10 (ref 5–15)
BUN: 13 mg/dL (ref 6–20)
CO2: 26 mmol/L (ref 22–32)
Calcium: 9.7 mg/dL (ref 8.9–10.3)
Chloride: 97 mmol/L — ABNORMAL LOW (ref 98–111)
Creatinine, Ser: 0.72 mg/dL (ref 0.44–1.00)
GFR, Estimated: >60 mL/min (ref >60)
Glucose, Bld: 388 mg/dL — ABNORMAL HIGH (ref 70–99)
Potassium: 4.2 mmol/L (ref 3.5–5.1)
Sodium: 133 mmol/L — ABNORMAL LOW (ref 135–145)
Total Bilirubin: 0.8 mg/dL (ref 0.3–1.2)
Total Protein: 6.9 g/dL (ref 6.5–8.1)

## 2021-01-23 LAB — I-STAT CHEM 8, ED
BUN: 16 mg/dL (ref 6–20)
Calcium, Ion: 1.13 mmol/L — ABNORMAL LOW (ref 1.15–1.40)
Chloride: 98 mmol/L (ref 98–111)
Creatinine, Ser: 0.5 mg/dL (ref 0.44–1.00)
Glucose, Bld: 385 mg/dL — ABNORMAL HIGH (ref 70–99)
HCT: 40 % (ref 36.0–46.0)
Hemoglobin: 13.6 g/dL (ref 12.0–15.0)
Potassium: 4.3 mmol/L (ref 3.5–5.1)
Sodium: 135 mmol/L (ref 135–145)
TCO2: 27 mmol/L (ref 22–32)

## 2021-01-23 LAB — I-STAT BETA HCG BLOOD, ED (MC, WL, AP ONLY): I-stat hCG, quantitative: <5 m[IU]/mL

## 2021-01-23 LAB — GLUCOSE, CAPILLARY
Glucose-Capillary: 233 mg/dL — ABNORMAL HIGH (ref 70–99)
Glucose-Capillary: 303 mg/dL — ABNORMAL HIGH (ref 70–99)
Glucose-Capillary: 324 mg/dL — ABNORMAL HIGH (ref 70–99)

## 2021-01-23 LAB — MRSA NEXT GEN BY PCR, NASAL: MRSA by PCR Next Gen: NOT DETECTED

## 2021-01-23 LAB — CBG MONITORING, ED: Glucose-Capillary: 373 mg/dL — ABNORMAL HIGH (ref 70–99)

## 2021-01-23 SURGERY — IR WITH ANESTHESIA
Anesthesia: General

## 2021-01-23 MED ORDER — POLYETHYLENE GLYCOL 3350 17 G PO PACK: 17.0000 g | PACK | Freq: Every day | ORAL | Status: DC

## 2021-01-23 MED ORDER — CANGRELOR TETRASODIUM 50 MG IV SOLR
INTRAVENOUS | Status: AC
  Filled 2021-01-23: qty 50

## 2021-01-23 MED ORDER — SODIUM CHLORIDE 0.9 % IV SOLN: INTRAVENOUS | Status: DC

## 2021-01-23 MED ORDER — CLOPIDOGREL BISULFATE 300 MG PO TABS
ORAL_TABLET | ORAL | Status: AC
  Filled 2021-01-23: qty 1

## 2021-01-23 MED ORDER — IOHEXOL 350 MG/ML SOLN
75.0000 mL | Freq: Once | INTRAVENOUS | Status: AC
  Administered 2021-01-23: 15:00:00 75 mL via INTRAVENOUS

## 2021-01-23 MED ORDER — PHENYLEPHRINE 40 MCG/ML (10ML) SYRINGE FOR IV PUSH (FOR BLOOD PRESSURE SUPPORT)
PREFILLED_SYRINGE | INTRAVENOUS | Status: DC | PRN
  Administered 2021-01-23: 40 ug via INTRAVENOUS

## 2021-01-23 MED ORDER — IOHEXOL 300 MG/ML  SOLN
100.0000 mL | Freq: Once | INTRAMUSCULAR | Status: AC | PRN
  Administered 2021-01-23: 18:00:00 20 mL via INTRA_ARTERIAL

## 2021-01-23 MED ORDER — CHLORHEXIDINE GLUCONATE 0.12% ORAL RINSE (MEDLINE KIT)
15.0000 mL | Freq: Two times a day (BID) | OROMUCOSAL | Status: DC
  Administered 2021-01-23 – 2021-01-28 (×8): 15 mL via OROMUCOSAL

## 2021-01-23 MED ORDER — SODIUM CHLORIDE 0.9% FLUSH
3.0000 mL | Freq: Once | INTRAVENOUS | Status: AC
  Administered 2021-01-23: 19:00:00 3 mL via INTRAVENOUS

## 2021-01-23 MED ORDER — ONDANSETRON HCL 4 MG/2ML IJ SOLN
INTRAMUSCULAR | Status: DC | PRN
  Administered 2021-01-23: 4 mg via INTRAVENOUS

## 2021-01-23 MED ORDER — CLEVIDIPINE BUTYRATE 0.5 MG/ML IV EMUL
INTRAVENOUS | Status: AC
  Filled 2021-01-23: qty 50

## 2021-01-23 MED ORDER — SUCCINYLCHOLINE CHLORIDE 200 MG/10ML IV SOSY
PREFILLED_SYRINGE | INTRAVENOUS | Status: DC | PRN
  Administered 2021-01-23: 100 mg via INTRAVENOUS

## 2021-01-23 MED ORDER — SODIUM CHLORIDE 0.9 % IV SOLN: INTRAVENOUS | Status: DC | PRN

## 2021-01-23 MED ORDER — CLEVIDIPINE BUTYRATE 0.5 MG/ML IV EMUL
INTRAVENOUS | Status: DC | PRN
  Administered 2021-01-23: 2 mg/h via INTRAVENOUS

## 2021-01-23 MED ORDER — ASPIRIN 81 MG PO CHEW
CHEWABLE_TABLET | ORAL | Status: AC
  Filled 2021-01-23: qty 1

## 2021-01-23 MED ORDER — CHLORHEXIDINE GLUCONATE 0.12% ORAL RINSE (MEDLINE KIT)
15.0000 mL | Freq: Two times a day (BID) | OROMUCOSAL | Status: DC
  Administered 2021-01-23: 20:00:00 15 mL via OROMUCOSAL

## 2021-01-23 MED ORDER — CEFAZOLIN SODIUM-DEXTROSE 2-3 GM-%(50ML) IV SOLR
INTRAVENOUS | Status: DC | PRN
  Administered 2021-01-23: 2 g via INTRAVENOUS

## 2021-01-23 MED ORDER — ORAL CARE MOUTH RINSE
15.0000 mL | OROMUCOSAL | Status: DC
  Administered 2021-01-23 – 2021-01-24 (×5): 15 mL via OROMUCOSAL

## 2021-01-23 MED ORDER — PROPOFOL 1000 MG/100ML IV EMUL
5.0000 ug/kg/min | INTRAVENOUS | Status: DC
  Administered 2021-01-23: 19:00:00 50 ug/kg/min via INTRAVENOUS
  Administered 2021-01-23: 23:00:00 41 ug/kg/min via INTRAVENOUS
  Filled 2021-01-23 (×4): qty 100

## 2021-01-23 MED ORDER — CLEVIDIPINE BUTYRATE 0.5 MG/ML IV EMUL
0.0000 mg/h | INTRAVENOUS | Status: AC
  Administered 2021-01-23: 19:00:00 14 mg/h via INTRAVENOUS
  Administered 2021-01-24: 08:00:00 7 mg/h via INTRAVENOUS
  Filled 2021-01-23 (×3): qty 50

## 2021-01-23 MED ORDER — ACETAMINOPHEN 650 MG RE SUPP: 650.0000 mg | RECTAL | Status: DC | PRN

## 2021-01-23 MED ORDER — INSULIN REGULAR(HUMAN) IN NACL 100-0.9 UT/100ML-% IV SOLN
INTRAVENOUS | Status: AC
  Administered 2021-01-23: 17:00:00 23 [IU]/h via INTRAVENOUS
  Filled 2021-01-23: qty 100

## 2021-01-23 MED ORDER — TICAGRELOR 90 MG PO TABS
ORAL_TABLET | ORAL | Status: AC
  Filled 2021-01-23: qty 2

## 2021-01-23 MED ORDER — DOCUSATE SODIUM 50 MG/5ML PO LIQD: 100.0000 mg | Freq: Two times a day (BID) | ORAL | Status: DC

## 2021-01-23 MED ORDER — ACETAMINOPHEN 325 MG PO TABS
650.0000 mg | ORAL_TABLET | ORAL | Status: DC | PRN
  Administered 2021-01-26 – 2021-01-27 (×6): 650 mg via ORAL
  Filled 2021-01-23 (×5): qty 2

## 2021-01-23 MED ORDER — INSULIN ASPART 100 UNIT/ML IJ SOLN
0.0000 [IU] | INTRAMUSCULAR | Status: DC
  Administered 2021-01-23 (×2): 15 [IU] via SUBCUTANEOUS
  Administered 2021-01-24 (×2): 7 [IU] via SUBCUTANEOUS
  Administered 2021-01-24: 13:00:00 4 [IU] via SUBCUTANEOUS

## 2021-01-23 MED ORDER — ACETAMINOPHEN 325 MG PO TABS: 650.0000 mg | ORAL_TABLET | ORAL | Status: DC | PRN

## 2021-01-23 MED ORDER — STROKE: EARLY STAGES OF RECOVERY BOOK: Freq: Once | Status: AC

## 2021-01-23 MED ORDER — EPTIFIBATIDE 20 MG/10ML IV SOLN
INTRAVENOUS | Status: AC
  Filled 2021-01-23: qty 10

## 2021-01-23 MED ORDER — LABETALOL HCL 5 MG/ML IV SOLN
20.0000 mg | Freq: Once | INTRAVENOUS | Status: AC
  Administered 2021-01-23 (×2): 5 mg via INTRAVENOUS
  Administered 2021-01-23: 18:00:00 10 mg via INTRAVENOUS

## 2021-01-23 MED ORDER — TENECTEPLASE FOR STROKE
0.2500 mg/kg | PACK | Freq: Once | INTRAVENOUS | Status: AC
  Administered 2021-01-23: 15:00:00 22 mg via INTRAVENOUS
  Filled 2021-01-23: qty 10

## 2021-01-23 MED ORDER — NITROGLYCERIN 1 MG/10 ML FOR IR/CATH LAB
INTRA_ARTERIAL | Status: AC
  Administered 2021-01-23: 17:00:00 50 ug via INTRA_ARTERIAL
  Administered 2021-01-23: 17:00:00 25 ug via INTRA_ARTERIAL
  Filled 2021-01-23: qty 10

## 2021-01-23 MED ORDER — ROCURONIUM BROMIDE 10 MG/ML (PF) SYRINGE
PREFILLED_SYRINGE | INTRAVENOUS | Status: DC | PRN
  Administered 2021-01-23: 40 mg via INTRAVENOUS
  Administered 2021-01-23: 20 mg via INTRAVENOUS
  Administered 2021-01-23: 50 mg via INTRAVENOUS

## 2021-01-23 MED ORDER — FENTANYL CITRATE PF 50 MCG/ML IJ SOSY: 50.0000 ug | PREFILLED_SYRINGE | INTRAMUSCULAR | Status: DC | PRN

## 2021-01-23 MED ORDER — SODIUM CHLORIDE 0.9 % IV BOLUS
500.0000 mL | Freq: Once | INTRAVENOUS | Status: AC
  Administered 2021-01-23: 21:00:00 500 mL via INTRAVENOUS

## 2021-01-23 MED ORDER — FENTANYL CITRATE PF 50 MCG/ML IJ SOSY
50.0000 ug | PREFILLED_SYRINGE | INTRAMUSCULAR | Status: DC | PRN
Start: 2021-01-23 — End: 2021-01-25
  Administered 2021-01-23: 20:00:00 100 ug via INTRAVENOUS
  Administered 2021-01-24: 03:00:00 50 ug via INTRAVENOUS
  Filled 2021-01-23: qty 2
  Filled 2021-01-23: qty 1

## 2021-01-23 MED ORDER — ACETAMINOPHEN 160 MG/5ML PO SOLN: 650.0000 mg | ORAL | Status: DC | PRN

## 2021-01-23 MED ORDER — SENNOSIDES-DOCUSATE SODIUM 8.6-50 MG PO TABS: 1.0000 | ORAL_TABLET | Freq: Two times a day (BID) | ORAL | Status: DC

## 2021-01-23 MED ORDER — FENTANYL CITRATE (PF) 100 MCG/2ML IJ SOLN: 50.0000 ug | INTRAMUSCULAR | Status: DC | PRN

## 2021-01-23 MED ORDER — CLEVIDIPINE BUTYRATE 0.5 MG/ML IV EMUL: 0.0000 mg/h | INTRAVENOUS | Status: DC

## 2021-01-23 MED ORDER — PHENYLEPHRINE HCL-NACL 20-0.9 MG/250ML-% IV SOLN
INTRAVENOUS | Status: DC | PRN
  Administered 2021-01-23: 15 ug/min via INTRAVENOUS

## 2021-01-23 MED ORDER — ONDANSETRON HCL 4 MG/2ML IJ SOLN
4.0000 mg | Freq: Four times a day (QID) | INTRAMUSCULAR | Status: DC | PRN
  Administered 2021-01-27: 15:00:00 4 mg via INTRAVENOUS
  Filled 2021-01-23 (×2): qty 2

## 2021-01-23 MED ORDER — SUGAMMADEX SODIUM 200 MG/2ML IV SOLN
INTRAVENOUS | Status: DC | PRN
  Administered 2021-01-23: 200 mg via INTRAVENOUS

## 2021-01-23 MED ORDER — CHLORHEXIDINE GLUCONATE CLOTH 2 % EX PADS
6.0000 | MEDICATED_PAD | Freq: Every day | CUTANEOUS | Status: DC
  Administered 2021-01-23 – 2021-01-28 (×6): 6 via TOPICAL

## 2021-01-23 MED ORDER — PANTOPRAZOLE SODIUM 40 MG IV SOLR
40.0000 mg | Freq: Every day | INTRAVENOUS | Status: DC
  Administered 2021-01-23 – 2021-01-24 (×2): 40 mg via INTRAVENOUS
  Filled 2021-01-23 (×2): qty 40

## 2021-01-23 MED ORDER — CEFAZOLIN SODIUM-DEXTROSE 2-4 GM/100ML-% IV SOLN
INTRAVENOUS | Status: AC
  Filled 2021-01-23: qty 100

## 2021-01-23 MED ORDER — LIDOCAINE 2% (20 MG/ML) 5 ML SYRINGE
INTRAMUSCULAR | Status: DC | PRN
  Administered 2021-01-23: 100 mg via INTRAVENOUS

## 2021-01-23 MED ORDER — ESMOLOL HCL 100 MG/10ML IV SOLN
INTRAVENOUS | Status: DC | PRN
  Administered 2021-01-23: 30 mg via INTRAVENOUS
  Administered 2021-01-23: 20 mg via INTRAVENOUS
  Administered 2021-01-23 (×2): 30 mg via INTRAVENOUS

## 2021-01-23 MED ORDER — ORAL CARE MOUTH RINSE
15.0000 mL | OROMUCOSAL | Status: DC
  Administered 2021-01-23 – 2021-01-24 (×7): 15 mL via OROMUCOSAL

## 2021-01-23 MED ORDER — VERAPAMIL HCL 2.5 MG/ML IV SOLN
INTRAVENOUS | Status: AC
  Filled 2021-01-23: qty 2

## 2021-01-23 MED ORDER — DEXAMETHASONE SODIUM PHOSPHATE 10 MG/ML IJ SOLN
INTRAMUSCULAR | Status: DC | PRN
  Administered 2021-01-23: 5 mg via INTRAVENOUS

## 2021-01-23 MED ORDER — INSULIN REGULAR(HUMAN) IN NACL 100-0.9 UT/100ML-% IV SOLN: INTRAVENOUS | Status: DC

## 2021-01-23 MED ORDER — DEXMEDETOMIDINE HCL IN NACL 400 MCG/100ML IV SOLN: 0.0000 ug/kg/h | INTRAVENOUS | Status: DC

## 2021-01-23 MED ORDER — PROPOFOL 500 MG/50ML IV EMUL
INTRAVENOUS | Status: DC | PRN
  Administered 2021-01-23: 50 ug/kg/min via INTRAVENOUS

## 2021-01-23 MED ORDER — PROPOFOL 10 MG/ML IV BOLUS
INTRAVENOUS | Status: DC | PRN
  Administered 2021-01-23: 150 mg via INTRAVENOUS

## 2021-01-23 MED ORDER — IOHEXOL 300 MG/ML  SOLN
100.0000 mL | Freq: Once | INTRAMUSCULAR | Status: AC | PRN
  Administered 2021-01-23: 18:00:00 50 mL via INTRA_ARTERIAL

## 2021-01-23 NOTE — Progress Notes (Signed)
PT Cancellation Note  Patient Details Name: [name] MRN: [id number] DOB: 07-06-1970   Cancelled Treatment:    Reason Eval/Treat Not Completed: Active bedrest order. Pt arrived as a Code Stroke and was administered TNK dose around 1511. Bedrest orders active. Will plan to follow-up another day as appropriate.  Raymond Gurney, PT, DPT Acute Rehabilitation Services  Pager: [phone] Office: [phone]    Jewel Baize 01/23/2021, 4:04 PM

## 2021-01-23 NOTE — Code Documentation (Signed)
Stroke Response Nurse Documentation Code Documentation  SYANNA REMMERT is a 50 y.o. female arriving to [facility] ED via [facility] on 01/23/2021 with past medical hx of HTN, DM II, HLD. On No antithrombotic. Code stroke was activated by EMS.   Patient from home where she was LKW at 1400 and now complaining of left gaze, right sided weakness, and right facial droop. Pt was sitting watching football with her husband when he noticed a sudden change in her behavior. She began pointing at things and not speaking.  Stroke team at the bedside on patient arrival. Labs drawn and patient cleared for CT by Dr. Rhunette Croft. Patient to CT with team. NIHSS 22, see documentation for details and code stroke times. Patient with disoriented, not following commands, left gaze preference , right hemianopia, right facial droop, right arm weakness, right leg weakness, global aphasia, dysarthria, and neglect on exam. The following imaging was completed:  CT, CTA head and neck. Patient is a candidate for IV Thrombolytic. Patient is a candidate for IR.   Care/Plan: TNK given at 1512, pt taken to IR with team.  Bedside handoff with IR RN Renea Ee.    Barnabas Henriques L Janya Eveland  Rapid Response RN

## 2021-01-23 NOTE — H&P (Addendum)
Neurology H&P  CC: code stroke.   History is obtained from: EMS.   HPI: [name] is a 50 y.o. female with PMHx of HTN, uncontrolled DM II, and HLD who presents to ED today by EMS for acute onset of aphasia, gaze preference, and right sided weakness at 1400 hours. BP in 170s with glucose in 400s en route.   After patient cleared for airway at ED bridge, patient was taken emergently to CT suite. CTH neg for bleed. CTA head and neck positive for left M1 thrombus. TNK given at 1512. After which, imaging was discussed with IR radiologist who advised IR thrombectomy. NP tried to reach husband to review inclusion and exclusion criteria for TNK, but he did not answer. IR risks and benefits were reviewed with husband by Dr. Thomasena Edis and Dr. Bedelia Person on arrival.   Patient was then taken urgently to IR suite for procedure.   LKW: 1400 TNK given at 1512.  IR Thrombectomy? Yes, left M1 thrombus.  MRS: 0  NIHSS:  LOC: 0 Questions:2 Commands: 2 Eye: 2 Visual fields: 0 Face: 1 LUE:0 RUE: 4 LLE: 0 RLE:4 Ataxia: 0 Sensation:0 Language: 3 Dysarthria:0 Extinction/Inattention:0 Total: 18  ROS: A robust ROS was unable to be performed due to emergent nature of event.  PMHx: DM II, HLD, HTN  No family history on file.  Social History:  has no history on file for tobacco use, alcohol use, and drug use.  Prior to Admission medications   Not on File  Metformin on chart.   Exam: Current vital signs: BP (!) 144/75    Pulse 94    Temp 98.9 F (37.2 C) (Oral)    Resp 16    Wt 86.4 kg    SpO2 98%   Physical Exam  Constitutional: Acutely ill appearing obese female in NAD. Awake, alert.    Psych: Affect appropriate to situation. Eyes: No scleral injection. HENT: No OP obstruction. Head: Normocephalic/Atraumatic.   Cardiovascular: Normal rate and regular rhythm on telemetry.  No LE edema.  Respiratory: Effort normal.  GI: Soft.  No distension. There is no tenderness.  Skin:  WDI.  Neuro: Mental Status: Unable to assess orientation due to global aphasia.  Patient is unable to give a clear and coherent history. Mute.  Right side neglect.  II: Pupils are equal, round, and reactive to light. Does not blink to threat on right.   III,IV, VI: Does not cross midline with forced gaze.   V, VI: right sided facial droop noted at rest.   VIII: hearing is intact to voice. X: Uvula is midline and palate elevates symmetrically. XI, XII: head is grossly midline, with left gaze preference.   Motor:  Unable to lift RUE or RLE.  Able to hold up LUE and LLE on command.  Tone is normal. Bulk is normal.  Sensory: Withdraws to noxious stimuli on right side. Spontaneous movement on the left.   Cerebellar: Does not understand FNF and HKS.  Gait:  Deferred for safety.    I have reviewed labs in epic and the pertinent results are: INR  1.   aPTT  24.    creat 0.5.  glucose 388       MD reviewed images:  CTH 1.Question loss of gray-white differentiation in the left temporal lobe. Otherwise normal appearance of the brain parenchyma. Apparent hyperdense left M1 suggesting embolic disease. 2. ASPECTS is 9  CTA head and neck Acute embolus to the left M1 segment extending over length of  about 8 mm. Small amount of flow within the left MCA does opacify the more distal branch vessels. Atherosclerotic calcification at both carotid bifurcations but without measurable stenosis.  Assessment: 50 yo female with stroke risk factors of DM II, HTN, and HLD. Patient was a code stroke and TNK was given due to her deficits with MRS of 0 and NIHSS 18. After M1 thrombus seen on CTA, Consent was obtained from husband and IR was activated.   There was delay in administering tNK due to difficulty with IV access. The patient was deemed a good candidate for thrombectomy and discussed case with Dr. Estanislado Pandy who agreed.  There was delay in IR activation because we had difficulty getting in touch  with family (Husband). Discussed case and plan with ED staff who notified husband had just entered room. Discussed risks and benefits of the procedure and the husband consented procedure.    Impression:  -Left MCA stroke s/p TNK administration. -Left M1 thrombus with IR.  -Bilateral L>R carotid atherosclerosis. -Right sided weakness. -Global aphasia. -NIHSS 18 -Received thrombolytics in ED  Plan:   CNS:  -Admit to NICU for post thrombectomy management per protocol. -Close surveillance of neurological status. -Hip straight x 6 hrs. -Monitor groin puncture site for pulse quality, limb temperature, signs of neurovascular compromise. -Hold antiplatelets for 24 hours post tNK. -Maintain O2 sats > 94% -For temperature >37.5C - acetaminophen 650mg   -CT head 6 hours post procedure or per protocol.  -MRI brain without contrast when able. -Telemetry monitoring for arrhythmia. -Stroke education. -If there is acute neurologic decline STAT CT head. Acuity: Acute -PT/OT/ST  -NPO until passes swallow exam. Advance diet as tolerated.   Aphasia-global: -SLP evaluation.   Respiratory: -Monitor respiratory status.  -vent management per PCCM if still intubated after IR procedure.   CV: -Labetalol PRN and Cleviprex infusion prn to keep BP 120-140 post IR.  -TTE with bubble study.  -Telemetry to monitor for AF/arrhythmia.  -VTE: SCDs only.   Hematology: -No pharmaceutical VTE.  -SCDs only.  -bleeding precautions.  -Monitor bleeding at IR site.   GI/GU: -NPO until SLP evaluation.  -Monitor ins and outs.  -Protonix 40mg  IV qd.  -Senna 100mg  po bid-hold for loose stools.   Endocrine:  -Check HgA1c. Goal is < 7.  -SSI prn.  -check TSH.   Fluid/Electrolyte Disorders -IV NS at 75cc/hr.  -Monitor electrolytes.   Nutrition -Obesity.  -Consider dietary consult.   Code Status: Full Code.    THE FOLLOWING WERE PRESENT ON ADMISSION: CNS -  Acute Ischemic Stroke, left M1 thrombus.    This patient is critically ill and at significant risk of neurological worsening, death and care requires constant monitoring of vital signs, hemodynamics,respiratory and cardiac monitoring, neurological assessment, discussion with family, other specialists and medical decision making of high complexity. I spent 73 minutes of neurocritical care time  in the care of  this patient. This was time spent independent of any time provided by nurse practitioner or PA.  Patient seen by Clance Boll, MSN, APN-BC and MD. Note/plan to be edited by MD.    The patient was seen and examined with Clance Boll NP. We reviewed the imaging, labs and agree with plan as outlined in the note above.   Electronically signed by:  Lynnae Sandhoff, MD Page: [phone] 01/23/2021, 5:27 PM

## 2021-01-23 NOTE — ED Triage Notes (Signed)
Pt bib [facility] as a CODE STROKE. Pt was watching tv with husband at 1400 today and started "acting weird". Upon arrival of ems pt had Right facial droop, Left sided gaze, R arm was flaccid, and pt is non verbal.  EMS vitals : 170/70, 436 CBG, 70 HR NSR, 100% RA

## 2021-01-23 NOTE — Anesthesia Preprocedure Evaluation (Addendum)
Anesthesia Evaluation  Patient identified by MRN, date of birth, ID band Patient confused  General Assessment Comment:CVA with aphasia  Reviewed: Allergy & Precautions, NPO status , Patient's Chart, lab work & pertinent test results  Airway Mallampati: II  TM Distance: >3 FB Neck ROM: Full    Dental no notable dental hx.    Pulmonary neg pulmonary ROS,    Pulmonary exam normal breath sounds clear to auscultation       Cardiovascular hypertension, Normal cardiovascular exam Rhythm:Regular Rate:Normal  Undiagnosed HTN   Neuro/Psych negative neurological ROS  negative psych ROS   GI/Hepatic negative GI ROS, Neg liver ROS,   Endo/Other  diabetesUndiagnosed and untreated  Renal/GU negative Renal ROS  negative genitourinary   Musculoskeletal negative musculoskeletal ROS (+)   Abdominal   Peds negative pediatric ROS (+)  Hematology negative hematology ROS (+)   Anesthesia Other Findings   Reproductive/Obstetrics negative OB ROS                            Anesthesia Physical Anesthesia Plan  ASA: 3 and emergent  Anesthesia Plan: General   Post-op Pain Management:    Induction: Intravenous and Rapid sequence  PONV Risk Score and Plan: 3 and Ondansetron, Dexamethasone and Treatment may vary due to age or medical condition  Airway Management Planned: Oral ETT  Additional Equipment:   Intra-op Plan:   Post-operative Plan: Possible Post-op intubation/ventilation  Informed Consent: I have reviewed the patients History and Physical, chart, labs and discussed the procedure including the risks, benefits and alternatives for the proposed anesthesia with the patient or authorized representative who has indicated his/her understanding and acceptance.     Dental advisory given  Plan Discussed with: CRNA and Surgeon  Anesthesia Plan Comments:         Anesthesia Quick Evaluation

## 2021-01-23 NOTE — ED Notes (Signed)
Arnoldo Morale (Husband)- [phone]

## 2021-01-23 NOTE — ED Provider Notes (Signed)
[facility] NEURO/TRAUMA/SURGICAL ICU Provider Note   CSN: [id number] Arrival date & time: 01/23/21  1454  An emergency department physician performed an initial assessment on this suspected stroke patient at 78.  History No chief complaint on file.   Gail NEUNER is a 50 y.o. female.  HPI    50 y.o. female with PMHx of HTN, uncontrolled DM II, and HLD who presents to ED today by EMS for acute onset of aphasia, gaze preference, and right sided weakness at 1400 hours.  Last known is pretty precise given that patient was watching television with her husband.  Patient was seen in the bridge by EDP who cleared her for airway and patient had a stat CT completed with neurologist at the bedside. I saw the patient in the CT room post scans.  Level 5 caveat for acuity of symptoms, inability to communicate verbally  No past medical history on file.  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     No family history on file.     Home Medications Prior to Admission medications   Not on File    Allergies    Patient has no allergy information on record.  Review of Systems   Review of Systems  Unable to perform ROS: Patient nonverbal   Physical Exam Updated Vital Signs BP (!) 142/83    Pulse 88    Temp 98.9 F (37.2 C) (Oral)    Resp 18    Ht 5' 6"  (1.676 m)    Wt 86.4 kg    SpO2 99%    BMI 30.74 kg/m   Physical Exam Vitals and nursing note reviewed.  Cardiovascular:     Rate and Rhythm: Normal rate.  Pulmonary:     Effort: Pulmonary effort is normal.  Neurological:     Comments: Limited exam -right-sided upper and lower extremity weakness, gaze  abnormality    ED Results / Procedures / Treatments   Labs (all labs ordered are listed, but only abnormal results are displayed) Labs Reviewed  CBC - Abnormal; Notable for the following components:      Result Value   WBC 10.7 (*)    All other components within normal limits  DIFFERENTIAL - Abnormal; Notable for the following components:   Lymphs Abs 4.9 (*)    All other components within normal limits  COMPREHENSIVE METABOLIC PANEL - Abnormal; Notable for the following components:   Sodium 133 (*)    Chloride 97 (*)    Glucose, Bld 388 (*)    All other components within normal limits  GLUCOSE, CAPILLARY - Abnormal; Notable for the following components:   Glucose-Capillary 233 (*)    All other components within normal limits  I-STAT CHEM 8, ED - Abnormal; Notable for the following components:   Glucose, Bld 385 (*)    Calcium, Ion 1.13 (*)    All other components within normal limits  CBG MONITORING, ED - Abnormal; Notable for the following components:   Glucose-Capillary 373 (*)    All other components within normal limits  RESP PANEL BY RT-PCR (FLU A&B, COVID) ARPGX2  MRSA NEXT GEN BY PCR, NASAL  PROTIME-INR  APTT  HIV  ANTIBODY (ROUTINE TESTING W REFLEX)  TSH  HEMOGLOBIN A1C  LIPID PANEL  BLOOD GAS, ARTERIAL  TRIGLYCERIDES  CBC WITH DIFFERENTIAL/PLATELET  BASIC METABOLIC PANEL  I-STAT BETA HCG BLOOD, ED ([facility], [facility], [facility] ONLY)    EKG None  Radiology CT HEAD CODE STROKE WO CONTRAST  Result Date: 01/23/2021 CLINICAL DATA:  Code stroke. Neuro deficit, acute, stroke suspected. EXAM: CT HEAD WITHOUT CONTRAST TECHNIQUE: Contiguous axial images were obtained from the base of the skull through the vertex without intravenous contrast. COMPARISON:  None. FINDINGS: Brain: No abnormality of the brainstem or cerebellum. Equivocal loss of gray-white differentiation in the left temporal lobe. No other brain parenchymal finding. Vascular: Apparent hyperdense left M1 suggest embolic  disease. Skull: Normal Sinuses/Orbits: Clear/normal Other: None ASPECTS ([place] Stroke Program Early CT Score) - Ganglionic level infarction (caudate, lentiform nuclei, internal capsule, insula, M1-M3 cortex): 6 - Supraganglionic infarction (M4-M6 cortex): 3 Total score (0-10 with 10 being normal): 9 IMPRESSION: 1. Question loss of gray-white differentiation in the left temporal lobe. Otherwise normal appearance of the brain parenchyma. Apparent hyperdense left M1 suggesting embolic disease. 2. ASPECTS is 9 3. These results were communicated to Dr. Theda Sers at 3:19 pm on 01/23/2021 by text page via the [facility] messaging system. Electronically Signed   By: Nelson Chimes M.D.   On: 01/23/2021 15:19   CT ANGIO HEAD CODE STROKE  Result Date: 01/23/2021 CLINICAL DATA:  Acute deficit, stroke suspected. Abnormal head CT. Left hemisphere insult. EXAM: CT ANGIOGRAPHY HEAD AND NECK TECHNIQUE: Multidetector CT imaging of the head and neck was performed using the standard protocol during bolus administration of intravenous contrast. Multiplanar CT image reconstructions and MIPs were obtained to evaluate the vascular anatomy. Carotid stenosis measurements (when applicable) are obtained utilizing NASCET criteria, using the distal internal carotid diameter as the denominator. CONTRAST:  10m OMNIPAQUE IOHEXOL 350 MG/ML SOLN COMPARISON:  Head CT earlier same day FINDINGS: CTA NECK FINDINGS Aortic arch: Normal Right carotid system: Common carotid artery widely patent to the bifurcation. Calcified plaque at the carotid bifurcation but no stenosis. Cervical ICA is widely patent. Left carotid system: Common carotid artery widely patent to the bifurcation. Calcified plaque at the carotid bifurcation and ICA bulb but no stenosis. Cervical ICA widely patent beyond that. Vertebral arteries: Both vertebral artery origins are widely patent. The left is dominant. Both vertebral arteries are patent through the cervical region to the foramen  magnum. Skeleton: Scoliotic curvature which could be positional. No acute finding. Other neck: No soft tissue mass or lymphadenopathy. Upper chest: Negative Review of the MIP images confirms the above findings CTA HEAD FINDINGS Anterior circulation: Both internal carotid arteries are patent through the skull base and siphon region. On the right, the anterior and middle cerebral vessels are patent without large vessel occlusion or stenosis. On the left, the anterior cerebral artery is widely patent. There is an embolus within the M1 segment extending over a length of about 8 mm. This corresponds with the hyperdense vessel on the CT. This results in marked stenosis of the vessel but not complete occlusion. More distal MCA branch vessels do show flow. There is fetal origin of the left PCA which appears widely patent. Posterior circulation: Both vertebral arteries widely patent to the basilar. No basilar stenosis. Posterior circulation branch vessels are patent. Venous sinuses: Patent and normal. Anatomic variants: None significant. Review of the MIP images confirms the above findings IMPRESSION: Acute embolus to the left M1 segment extending over length of about 8 mm. Small amount  of flow within the left MCA does opacify the more distal branch vessels. Atherosclerotic calcification at both carotid bifurcations but without measurable stenosis. Results discussed with Dr. Theda Sers during interpretation at 1531 hours. Electronically Signed   By: Nelson Chimes M.D.   On: 01/23/2021 15:36   CT ANGIO NECK CODE STROKE  Result Date: 01/23/2021 CLINICAL DATA:  Acute deficit, stroke suspected. Abnormal head CT. Left hemisphere insult. EXAM: CT ANGIOGRAPHY HEAD AND NECK TECHNIQUE: Multidetector CT imaging of the head and neck was performed using the standard protocol during bolus administration of intravenous contrast. Multiplanar CT image reconstructions and MIPs were obtained to evaluate the vascular anatomy. Carotid stenosis  measurements (when applicable) are obtained utilizing NASCET criteria, using the distal internal carotid diameter as the denominator. CONTRAST:  58m OMNIPAQUE IOHEXOL 350 MG/ML SOLN COMPARISON:  Head CT earlier same day FINDINGS: CTA NECK FINDINGS Aortic arch: Normal Right carotid system: Common carotid artery widely patent to the bifurcation. Calcified plaque at the carotid bifurcation but no stenosis. Cervical ICA is widely patent. Left carotid system: Common carotid artery widely patent to the bifurcation. Calcified plaque at the carotid bifurcation and ICA bulb but no stenosis. Cervical ICA widely patent beyond that. Vertebral arteries: Both vertebral artery origins are widely patent. The left is dominant. Both vertebral arteries are patent through the cervical region to the foramen magnum. Skeleton: Scoliotic curvature which could be positional. No acute finding. Other neck: No soft tissue mass or lymphadenopathy. Upper chest: Negative Review of the MIP images confirms the above findings CTA HEAD FINDINGS Anterior circulation: Both internal carotid arteries are patent through the skull base and siphon region. On the right, the anterior and middle cerebral vessels are patent without large vessel occlusion or stenosis. On the left, the anterior cerebral artery is widely patent. There is an embolus within the M1 segment extending over a length of about 8 mm. This corresponds with the hyperdense vessel on the CT. This results in marked stenosis of the vessel but not complete occlusion. More distal MCA branch vessels do show flow. There is fetal origin of the left PCA which appears widely patent. Posterior circulation: Both vertebral arteries widely patent to the basilar. No basilar stenosis. Posterior circulation branch vessels are patent. Venous sinuses: Patent and normal. Anatomic variants: None significant. Review of the MIP images confirms the above findings IMPRESSION: Acute embolus to the left M1 segment  extending over length of about 8 mm. Small amount of flow within the left MCA does opacify the more distal branch vessels. Atherosclerotic calcification at both carotid bifurcations but without measurable stenosis. Results discussed with Dr. CTheda Sersduring interpretation at 1531 hours. Electronically Signed   By: MNelson ChimesM.D.   On: 01/23/2021 15:36    Procedures .Critical Care Performed by: NVarney Biles MD Authorized by: NVarney Biles MD   Critical care provider statement:    Critical care time (minutes):  32   Critical care was necessary to treat or prevent imminent or life-threatening deterioration of the following conditions:  CNS failure or compromise   Critical care was time spent personally by me on the following activities:  Development of treatment plan with patient or surrogate, discussions with consultants, examination of patient, ordering and performing treatments and interventions, pulse oximetry, re-evaluation of patient's condition, review of old charts, obtaining history from patient or surrogate, evaluation of patient's response to treatment, ordering and review of radiographic studies and ordering and review of laboratory studies   Medications Ordered in ED Medications  stroke: mapping our early stages of recovery book (has no administration in time range)  0.9 %  sodium chloride infusion ( Intravenous New Bag/Given 01/23/21 1832)  acetaminophen (TYLENOL) tablet 650 mg (has no administration in time range)    Or  acetaminophen (TYLENOL) 160 MG/5ML solution 650 mg (has no administration in time range)    Or  acetaminophen (TYLENOL) suppository 650 mg (has no administration in time range)  senna-docusate (Senokot-S) tablet 1 tablet (has no administration in time range)  pantoprazole (PROTONIX) injection 40 mg (has no administration in time range)  acetaminophen (TYLENOL) tablet 650 mg (has no administration in time range)    Or  acetaminophen (TYLENOL) 160 MG/5ML  solution 650 mg (has no administration in time range)    Or  acetaminophen (TYLENOL) suppository 650 mg (has no administration in time range)  ondansetron (ZOFRAN) injection 4 mg (has no administration in time range)  ceFAZolin (ANCEF) 2-4 GM/100ML-% IVPB (has no administration in time range)  cangrelor (KENGREAL) 50 MG SOLR (has no administration in time range)  0.9 %  sodium chloride infusion ( Intravenous Duplicate 23/53/61 4431)  clevidipine (CLEVIPREX) infusion 0.5 mg/mL (14 mg/hr Intravenous New Bag/Given 01/23/21 1834)  docusate (COLACE) 50 MG/5ML liquid 100 mg (has no administration in time range)  polyethylene glycol (MIRALAX / GLYCOLAX) packet 17 g (has no administration in time range)  propofol (DIPRIVAN) 1000 MG/100ML infusion (50 mcg/kg/min  86.4 kg Intravenous New Bag/Given 01/23/21 1833)  Chlorhexidine Gluconate Cloth 2 % PADS 6 each (6 each Topical Given 01/23/21 1835)  chlorhexidine gluconate (MEDLINE KIT) (PERIDEX) 0.12 % solution 15 mL (has no administration in time range)  MEDLINE mouth rinse (has no administration in time range)  insulin aspart (novoLOG) injection 0-20 Units (has no administration in time range)  fentaNYL (SUBLIMAZE) injection 50 mcg (has no administration in time range)  fentaNYL (SUBLIMAZE) injection 50-200 mcg (has no administration in time range)  sodium chloride flush (NS) 0.9 % injection 3 mL (3 mLs Intravenous Given 01/23/21 1835)  tenecteplase (TNKASE) injection for Stroke 22 mg (22 mg Intravenous Given 01/23/21 1512)  iohexol (OMNIPAQUE) 350 MG/ML injection 75 mL (75 mLs Intravenous Contrast Given 01/23/21 1526)  labetalol (NORMODYNE) injection 20 mg (10 mg Intravenous Given 01/23/21 1753)  nitroGLYCERIN 100 mcg/mL intra-arterial injection (25 mcg Intra-arterial Given by Other 01/23/21 1659)  clevidipine (CLEVIPREX) 0.5 MG/ML infusion (  Override pull for Anesthesia 01/23/21 1704)  iohexol (OMNIPAQUE) 300 MG/ML solution 100 mL (20 mLs  Intra-arterial Contrast Given 01/23/21 1731)  iohexol (OMNIPAQUE) 300 MG/ML solution 100 mL (50 mLs Intra-arterial Contrast Given 01/23/21 1731)  insulin regular, human (MYXREDLIN) 100 units/ 100 mL infusion (0 Units/hr Intravenous Stopped 01/23/21 1753)    ED Course  I have reviewed the triage vital signs and the nursing notes.  Pertinent labs & imaging results that were available during my care of the patient were reviewed by me and considered in my medical decision making (see chart for details).    MDM Rules/Calculators/A&P                          50 year old comes in with chief complaint of weakness, acute mental status change  I assessed the patient after CT scan was completed and neurologist had already decided to give fibrinolytic agent.  Work-up did reveal acute LVO.  Neurologist was unable to get in touch with family for consent, was able to find patient's husband consented for the procedure.  Hemodynamically stable.  Final Clinical Impression(s) / ED Diagnoses Final diagnoses:  Acute ischemic stroke [facility])    Rx / DC Orders ED Discharge Orders     None        Varney Biles, MD 01/23/21 1905

## 2021-01-23 NOTE — Anesthesia Procedure Notes (Signed)
Procedure Name: Intubation Date/Time: 01/23/2021 4:14 PM Performed by: Zollie Scale, CRNA Pre-anesthesia Checklist: Patient identified, Emergency Drugs available, Suction available and Patient being monitored Patient Re-evaluated:Patient Re-evaluated prior to induction Oxygen Delivery Method: Circle System Utilized Preoxygenation: Pre-oxygenation with 100% oxygen Induction Type: IV induction, Rapid sequence and Cricoid Pressure applied Laryngoscope Size: Miller and 2 Grade View: Grade I Tube type: Oral Tube size: 7.0 mm Number of attempts: 1 Airway Equipment and Method: Stylet and Oral airway Placement Confirmation: ETT inserted through vocal cords under direct vision, positive ETCO2 and breath sounds checked- equal and bilateral Secured at: 21 cm Tube secured with: Tape Dental Injury: Teeth and Oropharynx as per pre-operative assessment

## 2021-01-23 NOTE — Progress Notes (Signed)
Patient ID: [name], female   DOB: 02/14/1970, 50 y.o.   MRN: [id number] INR. 50 Y RT H F LSW 1400. MRS 0  New onset Lt gaze deviation,Rt sided weakness and aphasia . CT brain NO ICH ASPECTS 9 CTA  near complete occlusion of mid to distal Lt MCA M1 extending into the bifurcation.  Endovascular treatment D/W with spouse by Dr.  Marisue Humble. Procedure,reasons,risks and alternatives reviewed.  Spouse expressed understanding and provided consent to the treatment. S.Kestrel Mis MD

## 2021-01-23 NOTE — Consult Note (Signed)
NAME:  [name], MRN:  [id number], DOB:  07-06-1970, LOS: 0 ADMISSION DATE:  01/23/2021, CONSULTATION DATE: 01/23/2021 REFERRING MD:  Julieanne Cotton, MD , CHIEF COMPLAINT: Right-sided weakness  History of Present Illness:  50 year old female with hypertension, diabetes and hyperlipidemia was brought in the emergency department with right-sided weakness, right gaze preference and aphasia starting at 2 PM.  She received TNKase, CT angiogram showed left M1 occlusion, she underwent mechanical thrombectomy with TICI 1:03 pass.  Postprocedure CT brain showed left Perry insular hyperintensity could be subarachnoid versus contrast.  Patient was intubated for procedure, PCCM was consulted to help with medical management.  Pertinent  Medical History  Diabetes Hypertension Hyperlipidemia   Significant Hospital Events: Including procedures, antibiotic start and stop dates in addition to other pertinent events   Received TNKase and underwent mechanical thrombectomy of left M1  Interim History / Subjective:    Objective   Blood pressure (!) 144/75, pulse 94, temperature 98.9 F (37.2 C), temperature source Oral, resp. rate 16, weight 86.4 kg, SpO2 98 %.        Intake/Output Summary (Last 24 hours) at 01/23/2021 1751 Last data filed at 01/23/2021 1630 Gross per 24 hour  Intake 50 ml  Output --  Net 50 ml   Filed Weights   01/23/21 1500  Weight: 86.4 kg    Examination: General: Crtitically ill-appearing female, orally intubated HEENT: [place]/AT, eyes anicteric.  ETT and OGT in place Neuro: Sedated, not following commands.  Eyes are closed.  Pupils 3 mm bilateral reactive to light Chest: Coarse breath sounds, no wheezes or rhonchi Heart: Regular rate and rhythm, no murmurs or gallops Abdomen: Soft, nontender, nondistended, bowel sounds present Skin: No rash  Resolved Hospital Problem list     Assessment & Plan:  Acute left MCA stroke s/p TNKase and mechanical  thrombectomy Acute respiratory insufficiency postprocedure Poorly controlled diabetes with hyperglycemia Hypertension Hyperlipidemia Obesity  Continue neuro watch every hour Stroke team is following Continue secondary stroke prophylaxis No antiplatelets for 24 hours Echocardiogram and MRI brain is pending Maintain SBP with a goal 120-140 Continue clevidipine infusion as needed Continue lung protective ventilation Patient needs to keep her leg straight until morning, will try to wake her up and extubate in the morning Continue Precedex with RASS goal -1/-2 Continue as needed fentanyl Patient blood sugars are not well controlled, ranging in 300s Currently on insulin infusion, will continue with diet, once controlled, will switch to over to long-acting insulin Hemoglobin A1c is pending Continue atorvastatin  Best Practice (right click and "Reselect all SmartList Selections" daily)   Diet/type: NPO DVT prophylaxis: SCD GI prophylaxis: PPI Lines: N/A Foley:  N/A Code Status:  full code Last date of multidisciplinary goals of care discussion [Per primary team]  Labs   CBC: Recent Labs  Lab 01/23/21 1501 01/23/21 1509  WBC 10.7*  --   NEUTROABS 4.9  --   HGB 13.5 13.6  HCT 39.6 40.0  MCV 93.4  --   PLT 236  --     Basic Metabolic Panel: Recent Labs  Lab 01/23/21 1501 01/23/21 1509  NA 133* 135  K 4.2 4.3  CL 97* 98  CO2 26  --   GLUCOSE 388* 385*  BUN 13 16  CREATININE 0.72 0.50  CALCIUM 9.7  --    GFR: CrCl cannot be calculated (Unknown ideal weight.). Recent Labs  Lab 01/23/21 1501  WBC 10.7*    Liver Function Tests: Recent Labs  Lab 01/23/21 1501  AST 25  ALT 26  ALKPHOS 69  BILITOT 0.8  PROT 6.9  ALBUMIN 3.9   No results for input(s): LIPASE, AMYLASE in the last 168 hours. No results for input(s): AMMONIA in the last 168 hours.  ABG    Component Value Date/Time   TCO2 27 01/23/2021 1509     Coagulation Profile: Recent Labs  Lab  01/23/21 1501  INR 1.0    Cardiac Enzymes: No results for input(s): CKTOTAL, CKMB, CKMBINDEX, TROPONINI in the last 168 hours.  HbA1C: No results found for: HGBA1C  CBG: Recent Labs  Lab 01/23/21 1459  GLUCAP 373*    Review of Systems:   Unable to obtain as patient is intubated and sedated  Past Medical History:  She,  has no past medical history on file.   Surgical History:  Unable to obtain as patient is intubated and sedated  Social History:     Unable to obtain as patient is intubated and sedated Family History:  Her family history is not on file.   Allergies Not on File   Home Medications  Prior to Admission medications   Not on File     Critical care time:     Total critical care time: 36 minutes  Performed by: Cheri Fowler   Critical care time was exclusive of separately billable procedures and treating other patients.   Critical care was necessary to treat or prevent imminent or life-threatening deterioration.   Critical care was time spent personally by me on the following activities: development of treatment plan with patient and/or surrogate as well as nursing, discussions with consultants, evaluation of patient's response to treatment, examination of patient, obtaining history from patient or surrogate, ordering and performing treatments and interventions, ordering and review of laboratory studies, ordering and review of radiographic studies, pulse oximetry and re-evaluation of patient's condition.   Cheri Fowler MD [facility] See Amion for pager If no response to pager, please call [phone] until 7pm After 7pm, Please call E-link [phone]

## 2021-01-23 NOTE — Anesthesia Procedure Notes (Signed)
Arterial Line Insertion Start/End12/24/2022 4:20 PM, 01/23/2021 4:26 PM Performed by: Zollie Scale, CRNA, CRNA  Patient location: OOR procedure area. Emergency situation Left, radial was placed Catheter size: 20 G Hand hygiene performed  and Seldinger technique used Allen's test indicative of satisfactory collateral circulation Attempts: 1 Procedure performed using ultrasound guided technique. Ultrasound Notes:anatomy identified, needle tip was noted to be adjacent to the nerve/plexus identified and no ultrasound evidence of intravascular and/or intraneural injection Following insertion, dressing applied and Biopatch. Post procedure assessment: normal  Patient tolerated the procedure well with no immediate complications.

## 2021-01-23 NOTE — Procedures (Signed)
Lt common carotid artreriogram RT CFA approach. Findings. 1.S/P complete revascularization of Occlude prox inf division of Lt MCA with x 1 pass with solitaireX39mmx 40 mm retriever and contact aspiration achieving a TICI 3 revascularization.  Post CT brain .Mild Lt peri insular subarachnoid hyperattenation ?contrast +/- blood. No mass effect. 64F angioseal and  quick clot application with manual compression for hemostasis at RT CFA puncture site. Distal pulses palpable bilaterally.  Patient  left intubated as unable to follow commands on reversal.  Moves RT leg> RT arm spontaneously though not to command. Pupils 2 to 3 mm sluggish. RT groin soft. S.Adylynn Hertenstein MD

## 2021-01-23 NOTE — Transfer of Care (Addendum)
Immediate Anesthesia Transfer of Care Note  Patient: [name]  Procedure(s) Performed: IR WITH ANESTHESIA  Patient Location: ICU  Anesthesia Type:General  Level of Consciousness: Patient remains intubated per anesthesia plan  Airway & Oxygen Therapy: Patient remains intubated per anesthesia plan and Patient placed on Ventilator (see vital sign flow sheet for setting)  Post-op Assessment: Report given to RN and Post -op Vital signs reviewed and stable  Post vital signs: Reviewed and stable  Last Vitals:  Vitals Value Taken Time  BP 132/63 (A Line) 01/23/21 1813  Temp    Pulse 92 01/23/21 1826  Resp 19 01/23/21 1826  SpO2 98 % 01/23/21 1826  Vitals shown include unvalidated device data.  Last Pain:  Vitals:   01/23/21 1505  TempSrc: Oral     Patient unable to extubate, not following commands, remained intubated, sedated, Deveshwar MD and Rose MD aware, Cleviprex gtt maintained to keep SBP within range of 120-128mmHg, propofol maintained for sedation, insulin gtt managed via Endotool paused for FSBS 81, full report to Traci RN in ICU, VSS. Rt at bedside, applied to ventilator. Transport of patient in safe and stable condition.    Complications: No notable events documented.

## 2021-01-23 NOTE — Progress Notes (Signed)
PHARMACIST CODE STROKE RESPONSE  Notified to mix TNK at 1510 by Dr. Thomasena Edis Delivered TNK to RN at 1511  TNK dose = 22 mg IV over 5 seconds.   Issues/delays encountered (if applicable):   Daylene Posey 01/23/21 3:19 PM

## 2021-01-24 ENCOUNTER — Inpatient Hospital Stay (HOSPITAL_COMMUNITY): Payer: Federal, State, Local not specified - PPO

## 2021-01-24 DIAGNOSIS — I6602 Occlusion and stenosis of left middle cerebral artery: Secondary | ICD-10-CM

## 2021-01-24 DIAGNOSIS — I639 Cerebral infarction, unspecified: Secondary | ICD-10-CM

## 2021-01-24 DIAGNOSIS — I6389 Other cerebral infarction: Secondary | ICD-10-CM

## 2021-01-24 LAB — RAPID URINE DRUG SCREEN, HOSP PERFORMED
Amphetamines: NOT DETECTED
Barbiturates: NOT DETECTED
Benzodiazepines: NOT DETECTED
Cocaine: NOT DETECTED
Opiates: NOT DETECTED
Tetrahydrocannabinol: NOT DETECTED

## 2021-01-24 LAB — CBC WITH DIFFERENTIAL/PLATELET
Abs Immature Granulocytes: 0.06 10*3/uL (ref 0.00–0.07)
Basophils Absolute: 0 10*3/uL (ref 0.0–0.1)
Basophils Relative: 0 %
Eosinophils Absolute: 0 10*3/uL (ref 0.0–0.5)
Eosinophils Relative: 0 %
HCT: 33.6 % — ABNORMAL LOW (ref 36.0–46.0)
Hemoglobin: 11.6 g/dL — ABNORMAL LOW (ref 12.0–15.0)
Immature Granulocytes: 1 %
Lymphocytes Relative: 15 %
Lymphs Abs: 1.9 10*3/uL (ref 0.7–4.0)
MCH: 32 pg (ref 26.0–34.0)
MCHC: 34.5 g/dL (ref 30.0–36.0)
MCV: 92.6 fL (ref 80.0–100.0)
Monocytes Absolute: 0.3 10*3/uL (ref 0.1–1.0)
Monocytes Relative: 3 %
Neutro Abs: 10 10*3/uL — ABNORMAL HIGH (ref 1.7–7.7)
Neutrophils Relative %: 81 %
Platelets: 205 10*3/uL (ref 150–400)
RBC: 3.63 MIL/uL — ABNORMAL LOW (ref 3.87–5.11)
RDW: 11.7 % (ref 11.5–15.5)
WBC: 12.3 10*3/uL — ABNORMAL HIGH (ref 4.0–10.5)
nRBC: 0 % (ref 0.0–0.2)

## 2021-01-24 LAB — HIV ANTIBODY (ROUTINE TESTING W REFLEX): HIV Screen 4th Generation wRfx: NONREACTIVE

## 2021-01-24 LAB — LIPID PANEL
Cholesterol: 129 mg/dL (ref 0–200)
HDL: 33 mg/dL — ABNORMAL LOW (ref >40)
LDL Cholesterol: 73 mg/dL (ref 0–99)
Total CHOL/HDL Ratio: 3.9 RATIO
Triglycerides: 114 mg/dL (ref <150)
VLDL: 23 mg/dL (ref 0–40)

## 2021-01-24 LAB — ECHOCARDIOGRAM COMPLETE BUBBLE STUDY
AR max vel: 2.21 cm2
AV Area VTI: 2.43 cm2
AV Area mean vel: 2.22 cm2
AV Mean grad: 5 mmHg
AV Peak grad: 10 mmHg
Ao pk vel: 1.58 m/s
Area-P 1/2: 3.83 cm2
MV VTI: 2.29 cm2
S' Lateral: 2.4 cm

## 2021-01-24 LAB — GLUCOSE, CAPILLARY
Glucose-Capillary: 242 mg/dL — ABNORMAL HIGH (ref 70–99)
Glucose-Capillary: 220 mg/dL — ABNORMAL HIGH (ref 70–99)
Glucose-Capillary: 168 mg/dL — ABNORMAL HIGH (ref 70–99)
Glucose-Capillary: 148 mg/dL — ABNORMAL HIGH (ref 70–99)
Glucose-Capillary: 238 mg/dL — ABNORMAL HIGH (ref 70–99)

## 2021-01-24 LAB — TRIGLYCERIDES: Triglycerides: 112 mg/dL (ref <150)

## 2021-01-24 LAB — SEDIMENTATION RATE: Sed Rate: 8 mm/hr (ref 0–22)

## 2021-01-24 LAB — BASIC METABOLIC PANEL
Anion gap: 6 (ref 5–15)
BUN: 11 mg/dL (ref 6–20)
CO2: 22 mmol/L (ref 22–32)
Calcium: 8.5 mg/dL — ABNORMAL LOW (ref 8.9–10.3)
Chloride: 108 mmol/L (ref 98–111)
Creatinine, Ser: 0.61 mg/dL (ref 0.44–1.00)
GFR, Estimated: >60 mL/min (ref >60)
Glucose, Bld: 236 mg/dL — ABNORMAL HIGH (ref 70–99)
Potassium: 3.7 mmol/L (ref 3.5–5.1)
Sodium: 136 mmol/L (ref 135–145)

## 2021-01-24 LAB — TSH: TSH: 0.354 u[IU]/mL (ref 0.350–4.500)

## 2021-01-24 LAB — HEMOGLOBIN A1C
Hgb A1c MFr Bld: 11.1 % — ABNORMAL HIGH (ref 4.8–5.6)
Mean Plasma Glucose: 271.87 mg/dL

## 2021-01-24 LAB — C-REACTIVE PROTEIN: CRP: 0.5 mg/dL (ref <1.0)

## 2021-01-24 LAB — ANTITHROMBIN III: AntiThromb III Func: 104 % (ref 75–120)

## 2021-01-24 MED ORDER — LISINOPRIL 10 MG PO TABS
10.0000 mg | ORAL_TABLET | Freq: Every day | ORAL | Status: DC
  Administered 2021-01-24 – 2021-01-29 (×6): 10 mg via ORAL
  Filled 2021-01-24 (×6): qty 1

## 2021-01-24 MED ORDER — ATORVASTATIN CALCIUM 40 MG PO TABS
40.0000 mg | ORAL_TABLET | Freq: Every day | ORAL | Status: DC
  Administered 2021-01-24 – 2021-01-29 (×6): 40 mg via ORAL
  Filled 2021-01-24 (×6): qty 1

## 2021-01-24 MED ORDER — SENNOSIDES-DOCUSATE SODIUM 8.6-50 MG PO TABS: 1.0000 | ORAL_TABLET | Freq: Two times a day (BID) | ORAL | Status: DC

## 2021-01-24 MED ORDER — LISINOPRIL 10 MG PO TABS
10.0000 mg | ORAL_TABLET | Freq: Every day | ORAL | Status: DC
Start: 2021-01-24 — End: 2021-01-24

## 2021-01-24 MED ORDER — LISINOPRIL 10 MG PO TABS
10.0000 mg | ORAL_TABLET | Freq: Every day | ORAL | Status: DC
  Filled 2021-01-24: qty 1

## 2021-01-24 MED ORDER — HYDROCHLOROTHIAZIDE 25 MG PO TABS
25.0000 mg | ORAL_TABLET | Freq: Every day | ORAL | Status: DC
Start: 2021-01-24 — End: 2021-01-24

## 2021-01-24 MED ORDER — HYDROCHLOROTHIAZIDE 25 MG PO TABS
25.0000 mg | ORAL_TABLET | Freq: Every day | ORAL | Status: DC
  Filled 2021-01-24: qty 1

## 2021-01-24 MED ORDER — HYDROCHLOROTHIAZIDE 25 MG PO TABS
25.0000 mg | ORAL_TABLET | Freq: Every day | ORAL | Status: DC
  Administered 2021-01-24 – 2021-01-29 (×6): 25 mg via ORAL
  Filled 2021-01-24 (×6): qty 1

## 2021-01-24 MED ORDER — ATORVASTATIN CALCIUM 40 MG PO TABS: 40.0000 mg | ORAL_TABLET | Freq: Every day | ORAL | Status: DC

## 2021-01-24 MED ORDER — POLYETHYLENE GLYCOL 3350 17 G PO PACK: 17.0000 g | PACK | Freq: Every day | ORAL | Status: DC

## 2021-01-24 MED ORDER — SENNOSIDES-DOCUSATE SODIUM 8.6-50 MG PO TABS
1.0000 | ORAL_TABLET | Freq: Two times a day (BID) | ORAL | Status: DC
  Administered 2021-01-24 – 2021-01-28 (×9): 1 via ORAL
  Filled 2021-01-24 (×11): qty 1

## 2021-01-24 MED ORDER — DOCUSATE SODIUM 100 MG PO CAPS
100.0000 mg | ORAL_CAPSULE | Freq: Two times a day (BID) | ORAL | Status: DC
  Administered 2021-01-24 – 2021-01-29 (×11): 100 mg via ORAL
  Filled 2021-01-24 (×11): qty 1

## 2021-01-24 MED ORDER — INSULIN ASPART 100 UNIT/ML IJ SOLN
0.0000 [IU] | Freq: Three times a day (TID) | INTRAMUSCULAR | Status: DC
  Administered 2021-01-24: 17:00:00 3 [IU] via SUBCUTANEOUS
  Administered 2021-01-24 – 2021-01-25 (×4): 7 [IU] via SUBCUTANEOUS
  Administered 2021-01-26 (×2): 4 [IU] via SUBCUTANEOUS
  Administered 2021-01-26 – 2021-01-27 (×3): 7 [IU] via SUBCUTANEOUS
  Administered 2021-01-27: 07:00:00 4 [IU] via SUBCUTANEOUS
  Administered 2021-01-27: 13:00:00 3 [IU] via SUBCUTANEOUS
  Administered 2021-01-28: 18:00:00 4 [IU] via SUBCUTANEOUS
  Administered 2021-01-28 – 2021-01-29 (×4): 3 [IU] via SUBCUTANEOUS
  Administered 2021-01-29: 13:00:00 4 [IU] via SUBCUTANEOUS

## 2021-01-24 NOTE — Progress Notes (Signed)
PT Cancellation Note  Patient Details Name: [name] MRN: [id number] DOB: [birth year]   Cancelled Treatment:    Reason Eval/Treat Not Completed: Active bedrest order, will follow-up tomorrow for PT Evaluation.  Ina Homes, PT, DPT Acute Rehabilitation Services  Pager [phone] Office [phone]  Malachy Chamber 01/24/2021, 7:08 AM

## 2021-01-24 NOTE — Procedures (Signed)
Extubation Procedure Note  Patient Details:   Name: [name] DOB: 1970-11-19 MRN: [id number]   Airway Documentation:    Vent end date: 01/24/21 Vent end time: 0920   Evaluation  O2 sats: stable throughout Complications: No apparent complications Patient did tolerate procedure well. Bilateral Breath Sounds: Clear   Yes  Pt extubated to 4l [place] with RN at bedside. Positive cuff leak noted and pt is tolerating well. RT will continue to monitor.  Forest Becker 01/24/2021, 9:24 AM

## 2021-01-24 NOTE — Progress Notes (Addendum)
STROKE TEAM PROGRESS NOTE   INTERVAL HISTORY Patient is seen in her room with her husband and daughter at bedside.  She was admitted yesterday with right sided weakness and aphasia and found to have a left MCA stroke.   TNK was given, and patient underwent successful thrombectomy with TICI 3 flow achieved.  She has just been extubated and is able to follow commands, can converse and has no focal deficits on neurological exam.  Will order hypercoagulable workup and TEE given patient's age and family history of strokes in young people.  Vitals:   01/24/21 0800 01/24/21 0900 01/24/21 1000 01/24/21 1100  BP: 122/74 132/72 131/74 128/74  Pulse: (!) 106 (!) 104 87 80  Resp: Temp:      TempSrc:      SpO2: 98% 98% 98% 100%  Weight:      Height:       CBC:  Recent Labs  Lab 01/23/21 1501 01/23/21 1509 01/23/21 1952 01/24/21 0509  WBC 10.7*  --   --  12.3*  NEUTROABS 4.9  --   --  10.0*  HGB 13.5   < > 11.6* 11.6*  HCT 39.6   < > 34.0* 33.6*  MCV 93.4  --   --  92.6  PLT 236  --   --  205   < > = values in this interval not displayed.   Basic Metabolic Panel:  Recent Labs  Lab 01/23/21 1501 01/23/21 1509 01/23/21 1952 01/24/21 0509  NA 133* 135 140 136  K 4.2 4.3 3.3* 3.7  CL 97* 98  --  108  CO2 26  --   --  22  GLUCOSE 388* 385*  --  236*  BUN 13 16  --  11  CREATININE 0.72 0.50  --  0.61  CALCIUM 9.7  --   --  8.5*   Lipid Panel:  Recent Labs  Lab 01/24/21 0509  CHOL 129  TRIG 114   112  HDL 33*  CHOLHDL 3.9  VLDL 23  LDLCALC 73   HgbA1c:  Recent Labs  Lab 01/24/21 0509  HGBA1C 11.1*   Urine Drug Screen: No results for input(s): LABOPIA, COCAINSCRNUR, LABBENZ, AMPHETMU, THCU, LABBARB in the last 168 hours.  Alcohol Level No results for input(s): ETH in the last 168 hours.  IMAGING past 24 hours CT HEAD CODE STROKE WO CONTRAST  Result Date: 01/23/2021 CLINICAL DATA:  Code stroke. Neuro deficit, acute, stroke suspected. EXAM: CT HEAD WITHOUT  CONTRAST TECHNIQUE: Contiguous axial images were obtained from the base of the skull through the vertex without intravenous contrast. COMPARISON:  None. FINDINGS: Brain: No abnormality of the brainstem or cerebellum. Equivocal loss of gray-white differentiation in the left temporal lobe. No other brain parenchymal finding. Vascular: Apparent hyperdense left M1 suggest embolic disease. Skull: Normal Sinuses/Orbits: Clear/normal Other: None ASPECTS (Alberta Stroke Program Early CT Score) - Ganglionic level infarction (caudate, lentiform nuclei, internal capsule, insula, M1-M3 cortex): 6 - Supraganglionic infarction (M4-M6 cortex): 3 Total score (0-10 with 10 being normal): 9 IMPRESSION: 1. Question loss of gray-white differentiation in the left temporal lobe. Otherwise normal appearance of the brain parenchyma. Apparent hyperdense left M1 suggesting embolic disease. 2. ASPECTS is 9 3. These results were communicated to Dr. Thomasena Edis at 3:19 pm on 01/23/2021 by text page via the [facility] messaging system. Electronically Signed   By: Paulina Fusi M.D.   On: 01/23/2021 15:19   CT ANGIO HEAD CODE STROKE  Result  Date: 01/23/2021 CLINICAL DATA:  Acute deficit, stroke suspected. Abnormal head CT. Left hemisphere insult. EXAM: CT ANGIOGRAPHY HEAD AND NECK TECHNIQUE: Multidetector CT imaging of the head and neck was performed using the standard protocol during bolus administration of intravenous contrast. Multiplanar CT image reconstructions and MIPs were obtained to evaluate the vascular anatomy. Carotid stenosis measurements (when applicable) are obtained utilizing NASCET criteria, using the distal internal carotid diameter as the denominator. CONTRAST:  17mL OMNIPAQUE IOHEXOL 350 MG/ML SOLN COMPARISON:  Head CT earlier same day FINDINGS: CTA NECK FINDINGS Aortic arch: Normal Right carotid system: Common carotid artery widely patent to the bifurcation. Calcified plaque at the carotid bifurcation but no stenosis. Cervical ICA  is widely patent. Left carotid system: Common carotid artery widely patent to the bifurcation. Calcified plaque at the carotid bifurcation and ICA bulb but no stenosis. Cervical ICA widely patent beyond that. Vertebral arteries: Both vertebral artery origins are widely patent. The left is dominant. Both vertebral arteries are patent through the cervical region to the foramen magnum. Skeleton: Scoliotic curvature which could be positional. No acute finding. Other neck: No soft tissue mass or lymphadenopathy. Upper chest: Negative Review of the MIP images confirms the above findings CTA HEAD FINDINGS Anterior circulation: Both internal carotid arteries are patent through the skull base and siphon region. On the right, the anterior and middle cerebral vessels are patent without large vessel occlusion or stenosis. On the left, the anterior cerebral artery is widely patent. There is an embolus within the M1 segment extending over a length of about 8 mm. This corresponds with the hyperdense vessel on the CT. This results in marked stenosis of the vessel but not complete occlusion. More distal MCA branch vessels do show flow. There is fetal origin of the left PCA which appears widely patent. Posterior circulation: Both vertebral arteries widely patent to the basilar. No basilar stenosis. Posterior circulation branch vessels are patent. Venous sinuses: Patent and normal. Anatomic variants: None significant. Review of the MIP images confirms the above findings IMPRESSION: Acute embolus to the left M1 segment extending over length of about 8 mm. Small amount of flow within the left MCA does opacify the more distal branch vessels. Atherosclerotic calcification at both carotid bifurcations but without measurable stenosis. Results discussed with Dr. Thomasena Edis during interpretation at 1531 hours. Electronically Signed   By: Paulina Fusi M.D.   On: 01/23/2021 15:36   CT ANGIO NECK CODE STROKE  Result Date: 01/23/2021 CLINICAL  DATA:  Acute deficit, stroke suspected. Abnormal head CT. Left hemisphere insult. EXAM: CT ANGIOGRAPHY HEAD AND NECK TECHNIQUE: Multidetector CT imaging of the head and neck was performed using the standard protocol during bolus administration of intravenous contrast. Multiplanar CT image reconstructions and MIPs were obtained to evaluate the vascular anatomy. Carotid stenosis measurements (when applicable) are obtained utilizing NASCET criteria, using the distal internal carotid diameter as the denominator. CONTRAST:  56mL OMNIPAQUE IOHEXOL 350 MG/ML SOLN COMPARISON:  Head CT earlier same day FINDINGS: CTA NECK FINDINGS Aortic arch: Normal Right carotid system: Common carotid artery widely patent to the bifurcation. Calcified plaque at the carotid bifurcation but no stenosis. Cervical ICA is widely patent. Left carotid system: Common carotid artery widely patent to the bifurcation. Calcified plaque at the carotid bifurcation and ICA bulb but no stenosis. Cervical ICA widely patent beyond that. Vertebral arteries: Both vertebral artery origins are widely patent. The left is dominant. Both vertebral arteries are patent through the cervical region to the foramen magnum. Skeleton: Scoliotic curvature which  could be positional. No acute finding. Other neck: No soft tissue mass or lymphadenopathy. Upper chest: Negative Review of the MIP images confirms the above findings CTA HEAD FINDINGS Anterior circulation: Both internal carotid arteries are patent through the skull base and siphon region. On the right, the anterior and middle cerebral vessels are patent without large vessel occlusion or stenosis. On the left, the anterior cerebral artery is widely patent. There is an embolus within the M1 segment extending over a length of about 8 mm. This corresponds with the hyperdense vessel on the CT. This results in marked stenosis of the vessel but not complete occlusion. More distal MCA branch vessels do show flow. There is  fetal origin of the left PCA which appears widely patent. Posterior circulation: Both vertebral arteries widely patent to the basilar. No basilar stenosis. Posterior circulation branch vessels are patent. Venous sinuses: Patent and normal. Anatomic variants: None significant. Review of the MIP images confirms the above findings IMPRESSION: Acute embolus to the left M1 segment extending over length of about 8 mm. Small amount of flow within the left MCA does opacify the more distal branch vessels. Atherosclerotic calcification at both carotid bifurcations but without measurable stenosis. Results discussed with Dr. Thomasena Edis during interpretation at 1531 hours. Electronically Signed   By: Paulina Fusi M.D.   On: 01/23/2021 15:36    PHYSICAL EXAM General:  Alert, well-developed female in no acute distress   NEURO:  Mental Status: AA&Ox3  Speech/Language: speech is without aphasia. Very mild hypophonia due to recent extubation.  Naming, fluency, and comprehension intact.  Cranial Nerves:  II: PERRL. Visual fields full.  III, IV, VI: EOMI. Eyelids elevate symmetrically.  V: Sensation is intact to light touch and symmetrical to face.  VII: Smile is symmetrical.  VIII: hearing intact to voice. IX, X:  Phonation is normal.  XII: tongue is midline without fasciculations. Motor: 5/5 strength to all muscle groups tested.  Sensation- Intact to light touch bilaterally. Extinction absent to light touch to DSS.  Coordination: FTN intact bilaterally, .No drift.  Gait- deferred   ASSESSMENT/PLAN [name] is a 50 y.o. female with history of HTN, DM and HLD presenting with acute onset of aphasia and right sided weakness. She was found to have a left MCA stroke. TNK was given, and patient underwent successful thrombectomy with TICI 3 flow achieved.  Will order hypercoagulable workup and TEE given patient's age and family history of strokes in young people.  Stroke:  left of left middle cerebral  artery infarct  likely secondary due to thrombosis. She will need young stroke workup. Consider LE dopplers if echo shows PFO. Code Stroke CT head Hyperdense left M1 suggesting embolic disease ASPECTS 9   CTA head & neck Acute embolus of left M1 segment Post IR CT mild left perinsular subarachnoid hyperattenuation MRI  pending 2D Echo pending.  LDL 73 HgbA1c 11.1 Hypercoagulable workup pending VTE prophylaxis - SCDs    Diet   Diet NPO time specified   No antithrombotic prior to admission, now on No antithrombotic secondary to TNK being administered less than 24 hours ago Therapy recommendations:  pending Disposition:  pending  Hypertension Home meds:  lisinopril-HCTZ 1-/12.5 Unstable, requiring Cleviprex Restart home lisiniopril and HCTZ, try to taper off Cleviprex Keep SBP 120-140 Long-term BP goal normotensive  Hyperlipidemia Home meds:  atorvastatin 20 mg daily LDL 73, goal < 70 Increase atorvastatin to 40 mg daily  Continue statin at discharge  Diabetes type II Uncontrolled Home  meds:  metformin 500 mg daily HgbA1c 11.1, goal < 7.0 CBGs Recent Labs    01/23/21 2341 01/24/21 0423 01/24/21 0720  GLUCAP 303* 242* 220*   Diabetes coordinator consult SSI  Other Stroke Risk Factors Obesity, Body mass index is 30.74 kg/m., BMI >/= 30 associated with increased stroke risk, recommend weight loss, diet and exercise as appropriate  Family hx stroke (in multiple family members at young ages)   Other Active Problems none  Hospital day # 1  Cortney E Ernestina Columbia , MSN, AGACNP-BC [facility] See Amion for schedule and pager information 01/24/2021 12:24 PM  ATTENDING ATTESTATION:  Pt with no deficits s/p TNK and thrombectomy for left M1 thrombus. Extubated this morning on cliviprex and titrate down.  Will initiate young stroke workup. MRI today. Resume aspirin after 24hrs. Will need Echo and TEE. Discussion with pt's husband and updated him on prognosis  and diagnosis. All questions answered.   Dr. Viviann Spare evaluated pt independently, reviewed imaging, chart, labs. Discussed and formulated plan with the APP. Please see APP note above for details.     This patient is critically ill due to respiratory distress, stroke s/p tPA and at significant risk of neurological worsening, death form heart failure, respiratory failure, recurrent stroke, bleeding from [facility], seizure, sepsis. This patient's care requires constant monitoring of vital signs, hemodynamics, respiratory and cardiac monitoring, review of multiple databases, neurological assessment, discussion with family, other specialists and medical decision making of high complexity. I spent 35 minutes of neurocritical care time in the care of this patient.   Othal Kubitz,MD     To contact Stroke Continuity provider, please refer to [email or web address]. After hours, contact General Neurology

## 2021-01-24 NOTE — Anesthesia Postprocedure Evaluation (Signed)
Anesthesia Post Note  Patient: [name]  Procedure(s) Performed: IR WITH ANESTHESIA     Patient location during evaluation: SICU Anesthesia Type: General Level of consciousness: sedated Pain management: pain level controlled Vital Signs Assessment: post-procedure vital signs reviewed and stable Respiratory status: patient remains intubated per anesthesia plan Cardiovascular status: stable Postop Assessment: no apparent nausea or vomiting Anesthetic complications: no   No notable events documented.  Last Vitals:  Vitals:   01/23/21 2300 01/24/21 0000  BP: 129/87 113/69  Pulse: 77 78  Resp: 18 17  Temp:  37.1 C  SpO2: 99% 99%    Last Pain:  Vitals:   01/23/21 1505  TempSrc: Oral                 Corda Shutt S

## 2021-01-24 NOTE — Progress Notes (Signed)
°  B/l DVTs noted. Discussed with CCM to hold anticoagulation.  Brain MRI with petechial hemorrage and small SAH left cerebral hemisphere.  Clinically doing well NIHSS 0. Continue to monitor closely. Hold all antiplatelets.

## 2021-01-24 NOTE — Progress Notes (Signed)
Bilateral lower extremity venous duplex has been completed. Preliminary results can be found in CV Proc through chart review.  Results were given to the patient's nurse, Trish.  01/24/21 2:37 PM Olen Cordial RVT

## 2021-01-24 NOTE — Progress Notes (Signed)
°  Echocardiogram 2D Echocardiogram has been performed.  [name] 01/24/2021, 3:01 PM

## 2021-01-24 NOTE — Progress Notes (Signed)
Stroke and CCM aware of LE doppler results.

## 2021-01-24 NOTE — Progress Notes (Addendum)
Inpatient Diabetes Program Recommendations  AACE/ADA: New Consensus Statement on Inpatient Glycemic Control (2015)  Target Ranges:  Prepandial:   less than 140 mg/dL      Peak postprandial:   less than 180 mg/dL (1-2 hours)      Critically ill patients:  140 - 180 mg/dL    Latest Reference Range & Units 01/23/21 14:59 01/23/21 18:32 01/23/21 19:52 01/23/21 23:41 01/24/21 04:23 01/24/21 07:20 01/24/21 12:53  Glucose-Capillary 70 - 99 mg/dL 300 (H)  5 mg Decadron @ 1614 233 (H) 324 (H)  15 units Novolog  303 (H)  15 units Novolog  242 (H)  7 units Novolog  220 (H)  7 units Novolog  168 (H)  4 units Novolog     Latest Reference Range & Units 01/24/21 05:09  Hemoglobin A1C 4.8 - 5.6 % 11.1 (H)  (271 mg/dl)     Admit with: Code Stroke  History: DM  Home DM Meds: Metformin 500 mg daily  Current Orders: Novolog Resistant Correction Scale/ SSI (0-20 units) TID AC + HS     Extubated this AM  Current A1c shows very poor glucose control at home  Likely needs insulin for home to improve glucose control and needs follow up with PCP  Will see pt Monday to discuss home glucose control and A1c     --Will follow patient during hospitalization--  Ambrose Finland RN, MSN, CDE Diabetes Coordinator Inpatient Glycemic Control Team Team Pager: [phone] (8a-5p)

## 2021-01-24 NOTE — Consult Note (Signed)
° °  NAME:  [name], MRN:  [id number], DOB:  04/29/1970, LOS: 1 ADMISSION DATE:  01/23/2021, CONSULTATION DATE: 01/23/2021 REFERRING MD:  Julieanne Cotton, MD , CHIEF COMPLAINT: Right-sided weakness  History of Present Illness:  50 year old female with hypertension, diabetes and hyperlipidemia was brought in the emergency department with right-sided weakness, right gaze preference and aphasia starting at 2 PM.  She received TNKase, CT angiogram showed left M1 occlusion, she underwent mechanical thrombectomy with TICI 1:03 pass.  Postprocedure CT brain showed left Perry insular hyperintensity could be subarachnoid versus contrast.  Patient was intubated for procedure, PCCM was consulted to help with medical management.  Pertinent  Medical History  Diabetes Hypertension Hyperlipidemia   Significant Hospital Events: Including procedures, antibiotic start and stop dates in addition to other pertinent events   Received TNKase and underwent mechanical thrombectomy of left M1  Interim History / Subjective:   No acute issues overnight. Lightening up sedation this morning in anticipation of SBT  Objective   Blood pressure 130/80, pulse 80, temperature 98.6 F (37 C), temperature source Axillary, resp. rate 17, height 5\' 6"  (1.676 m), weight 86.4 kg, SpO2 98 %.    Vent Mode: PRVC FiO2 (%):  [40 %-50 %] 40 % Set Rate:  [18 bmp] 18 bmp Vt Set:  [470 mL] 470 mL PEEP:  [5 cmH20] 5 cmH20 Plateau Pressure:  [16 cmH20-20 cmH20] 18 cmH20   Intake/Output Summary (Last 24 hours) at 01/24/2021 0752 Last data filed at 01/24/2021 0700 Gross per 24 hour  Intake 3338.71 ml  Output 910 ml  Net 2428.71 ml   Filed Weights   01/23/21 1500  Weight: 86.4 kg    Examination: General: intubated, RASS -1 HEENT: /AT, eyes anicteric.  ETT and OGT in place Neuro: Following commands all ext.  Eyes are closed but open to soft voice.  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 Acute respiratory insufficiency postprocedure Poorly controlled diabetes with hyperglycemia Hypertension Hyperlipidemia Obesity  Continue neuro watch every hour Stroke team is following Continue secondary stroke prophylaxis No antiplatelets for 24 hours Echo pending MRI later today Home lisinopril 10 mg start today Home hctz 25 mg start today Wean cleviprex for SBP goal 120-140 SAT/SBT, plan to extubate Transitioned to SSI Hemoglobin A1c is above goal Continue atorvastatin  Best Practice (right click and "Reselect all SmartList Selections" daily)   Diet/type: NPO  DVT prophylaxis: SCD GI prophylaxis: PPI Lines: N/A Foley:  N/A Code Status:  full code Last date of multidisciplinary goals of care discussion [Per primary team]    Critical care time: 36 minutes     01/25/21 Pulmonary/Critical Care  See Amion for pager If no response to pager, please call [phone] until 7pm After 7pm, Please call E-link [phone]

## 2021-01-25 ENCOUNTER — Inpatient Hospital Stay (HOSPITAL_COMMUNITY): Payer: Federal, State, Local not specified - PPO

## 2021-01-25 ENCOUNTER — Other Ambulatory Visit: Payer: Self-pay

## 2021-01-25 ENCOUNTER — Encounter (HOSPITAL_COMMUNITY): Payer: Self-pay | Admitting: Radiology

## 2021-01-25 DIAGNOSIS — I63412 Cerebral infarction due to embolism of left middle cerebral artery: Principal | ICD-10-CM

## 2021-01-25 DIAGNOSIS — E11649 Type 2 diabetes mellitus with hypoglycemia without coma: Secondary | ICD-10-CM

## 2021-01-25 DIAGNOSIS — I639 Cerebral infarction, unspecified: Secondary | ICD-10-CM

## 2021-01-25 DIAGNOSIS — I82433 Acute embolism and thrombosis of popliteal vein, bilateral: Secondary | ICD-10-CM

## 2021-01-25 DIAGNOSIS — E7800 Pure hypercholesterolemia, unspecified: Secondary | ICD-10-CM

## 2021-01-25 DIAGNOSIS — Q2112 Patent foramen ovale: Secondary | ICD-10-CM

## 2021-01-25 DIAGNOSIS — I10 Essential (primary) hypertension: Secondary | ICD-10-CM

## 2021-01-25 LAB — GLUCOSE, CAPILLARY
Glucose-Capillary: 321 mg/dL — ABNORMAL HIGH (ref 70–99)
Glucose-Capillary: 81 mg/dL (ref 70–99)
Glucose-Capillary: 200 mg/dL — ABNORMAL HIGH (ref 70–99)
Glucose-Capillary: 210 mg/dL — ABNORMAL HIGH (ref 70–99)
Glucose-Capillary: 206 mg/dL — ABNORMAL HIGH (ref 70–99)
Glucose-Capillary: 214 mg/dL — ABNORMAL HIGH (ref 70–99)

## 2021-01-25 LAB — BASIC METABOLIC PANEL
Anion gap: 11 (ref 5–15)
BUN: 13 mg/dL (ref 6–20)
CO2: 23 mmol/L (ref 22–32)
Calcium: 8.7 mg/dL — ABNORMAL LOW (ref 8.9–10.3)
Chloride: 104 mmol/L (ref 98–111)
Creatinine, Ser: 0.58 mg/dL (ref 0.44–1.00)
GFR, Estimated: >60 mL/min (ref >60)
Glucose, Bld: 191 mg/dL — ABNORMAL HIGH (ref 70–99)
Potassium: 3.4 mmol/L — ABNORMAL LOW (ref 3.5–5.1)
Sodium: 138 mmol/L (ref 135–145)

## 2021-01-25 LAB — CBC
HCT: 32.2 % — ABNORMAL LOW (ref 36.0–46.0)
Hemoglobin: 10.9 g/dL — ABNORMAL LOW (ref 12.0–15.0)
MCH: 31.8 pg (ref 26.0–34.0)
MCHC: 33.9 g/dL (ref 30.0–36.0)
MCV: 93.9 fL (ref 80.0–100.0)
Platelets: 174 10*3/uL (ref 150–400)
RBC: 3.43 MIL/uL — ABNORMAL LOW (ref 3.87–5.11)
RDW: 12.1 % (ref 11.5–15.5)
WBC: 10.7 10*3/uL — ABNORMAL HIGH (ref 4.0–10.5)
nRBC: 0 % (ref 0.0–0.2)

## 2021-01-25 LAB — HEPARIN LEVEL (UNFRACTIONATED): Heparin Unfractionated: 0.26 IU/mL — ABNORMAL LOW (ref 0.30–0.70)

## 2021-01-25 MED ORDER — INSULIN STARTER KIT- PEN NEEDLES (ENGLISH)
1.0000 | Freq: Once | Status: AC
  Administered 2021-01-25: 15:00:00 1
  Filled 2021-01-25: qty 1

## 2021-01-25 MED ORDER — LIVING WELL WITH DIABETES BOOK
Freq: Once | Status: AC
  Filled 2021-01-25: qty 1

## 2021-01-25 MED ORDER — PANTOPRAZOLE SODIUM 40 MG PO TBEC
40.0000 mg | DELAYED_RELEASE_TABLET | Freq: Every day | ORAL | Status: DC
  Administered 2021-01-25 – 2021-01-29 (×5): 40 mg via ORAL
  Filled 2021-01-25 (×5): qty 1

## 2021-01-25 MED ORDER — POTASSIUM CHLORIDE CRYS ER 20 MEQ PO TBCR
40.0000 meq | EXTENDED_RELEASE_TABLET | Freq: Once | ORAL | Status: AC
  Administered 2021-01-25: 12:00:00 40 meq via ORAL
  Filled 2021-01-25: qty 2

## 2021-01-25 MED ORDER — HEPARIN SODIUM (PORCINE) 5000 UNIT/ML IJ SOLN: 5000.0000 [IU] | Freq: Three times a day (TID) | INTRAMUSCULAR | Status: DC

## 2021-01-25 MED ORDER — ASPIRIN EC 81 MG PO TBEC
81.0000 mg | DELAYED_RELEASE_TABLET | Freq: Every day | ORAL | Status: DC
  Administered 2021-01-25: 12:00:00 81 mg via ORAL
  Filled 2021-01-25: qty 1

## 2021-01-25 MED ORDER — HEPARIN (PORCINE) 25000 UT/250ML-% IV SOLN
1150.0000 [IU]/h | INTRAVENOUS | Status: DC
Start: 2021-01-25 — End: 2021-01-27
  Administered 2021-01-25: 14:00:00 1150 [IU]/h via INTRAVENOUS
  Administered 2021-01-26: 12:00:00 1200 [IU]/h via INTRAVENOUS
  Administered 2021-01-27: 10:00:00 1150 [IU]/h via INTRAVENOUS
  Filled 2021-01-25 (×3): qty 250

## 2021-01-25 NOTE — Progress Notes (Signed)
ANTICOAGULATION CONSULT NOTE - Follow Up Consult  Pharmacy Consult for IV Heparin Indication: DVT  No Known Allergies  Patient Measurements: Height: 5\' 6"  (167.6 cm) Weight: 86.4 kg (190 lb 7.6 oz) IBW/kg (Calculated) : 59.3 Heparin Dosing Weight: 77 kg  Vital Signs: Temp: 98.7 F (37.1 C) (12/26 2000) Temp Source: Oral (12/26 2000) BP: 133/71 (12/26 1900) Pulse Rate: 101 (12/26 1900)  Labs: Recent Labs    01/23/21 1501 01/23/21 1509 01/23/21 1952 01/24/21 0509 01/25/21 0558 01/25/21 2034  HGB 13.5 13.6 11.6* 11.6* 10.9*  --   HCT 39.6 40.0 34.0* 33.6* 32.2*  --   PLT 236  --   --  205 174  --   APTT 24  --   --   --   --   --   LABPROT 12.9  --   --   --   --   --   INR 1.0  --   --   --   --   --   HEPARINUNFRC  --   --   --   --   --  0.26*  CREATININE 0.72 0.50  --  0.61 0.58  --     Estimated Creatinine Clearance: 92.1 mL/min (by C-G formula based on SCr of 0.58 mg/dL).   Medications:  Infusions:   heparin 1,150 Units/hr (01/25/21 1600)    Assessment: 50 years of age female admitted with new CVA and received TNK and thrombectomy. Brain MRI with petechial hemorrhage and SAH. Doppler with bilateral DVTs today. Plan to use heparin for 48 hrs then oral anticoagulation. Low goal with new CVA.   Initial Heparin level is slightly low at 0.26 on current rate  of 1150 units/hr.  No bleeding or issues with line noted.   Goal of Therapy:  Heparin level 0.3 to 0.5 units/ml Monitor platelets by anticoagulation protocol: Yes   Plan:  Increase Heparin to 1250 units/hr (12.5 ml/hr) - no bolus.  Follow-up AM labs.   43, PharmD, BCPS, BCCCP Clinical Pharmacist Please refer to [facility] for [facility] numbers 01/25/2021,9:43 PM

## 2021-01-25 NOTE — Progress Notes (Addendum)
STROKE TEAM PROGRESS NOTE   INTERVAL HISTORY Patient is seen in her room with her husband and daughter at bedside.  She was admitted yesterday with right sided weakness and aphasia and found to have a left MCA stroke.   TNK was given, and patient underwent successful thrombectomy with TICI 3 flow achieved.   Hypercoagulable workup and TEE ordered given patient's age and family history of strokes in young people. Plan to repeat CT- if stable start heparin and move out of ICU. Per patient, there is a family history of clotting disorders, afib, and young strokes. 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   Vitals:   01/25/21 0600 01/25/21 0700 01/25/21 0800 01/25/21 0900  BP: 121/78 107/66 118/70 106/65  Pulse: 69 65 72 70  Resp: 20 14 18 17   Temp:   99.2 F (37.3 C)   TempSrc:   Oral   SpO2: 96% 95% 93% 94%  Weight:      Height:       CBC:  Recent Labs  Lab 01/23/21 1501 01/23/21 1509 01/24/21 0509 01/25/21 0558  WBC 10.7*  --  12.3* 10.7*  NEUTROABS 4.9  --  10.0*  --   HGB 13.5   < > 11.6* 10.9*  HCT 39.6   < > 33.6* 32.2*  MCV 93.4  --  92.6 93.9  PLT 236  --  205 174   < > = values in this interval not displayed.    Basic Metabolic Panel:  Recent Labs  Lab 01/24/21 0509 01/25/21 0558  NA 136 138  K 3.7 3.4*  CL 108 104  CO2 22 23  GLUCOSE 236* 191*  BUN 11 13  CREATININE 0.61 0.58  CALCIUM 8.5* 8.7*    Lipid Panel:  Recent Labs  Lab 01/24/21 0509  CHOL 129  TRIG 114   112  HDL 33*  CHOLHDL 3.9  VLDL 23  LDLCALC 73    HgbA1c:  Recent Labs  Lab 01/24/21 0509  HGBA1C 11.1*    Urine Drug Screen:  Recent Labs  Lab 01/24/21 1712  LABOPIA NONE DETECTED  COCAINSCRNUR NONE DETECTED  LABBENZ NONE DETECTED  AMPHETMU NONE DETECTED  THCU NONE DETECTED  LABBARB NONE DETECTED     Alcohol Level No results for input(s): ETH in the last 168 hours.  IMAGING past 24 hours MR BRAIN WO CONTRAST  Addendum Date:  01/24/2021   ADDENDUM REPORT: 01/24/2021 17:38 ADDENDUM: Findings in impressions 2 and 4 called to Dr. 01/26/2021 by telephone at approximately 5:30 p.m. on 01/24/2021. Electronically Signed   By: 01/26/2021 D.O.   On: 01/24/2021 17:38   Result Date: 01/24/2021 CLINICAL DATA:  Provided history: Stroke, follow-up. EXAM: MRI HEAD WITHOUT CONTRAST TECHNIQUE: Multiplanar, multiecho pulse sequences of the brain and surrounding structures were obtained without intravenous contrast. COMPARISON:  CT angiogram head/neck and non-contrast head CT 01/23/2021. FINDINGS: Brain: Cerebral volume is normal. Acute infarcts involving much of the left caudate body and left putamen. Small acute infarcts are also present within the anterior limb of internal capsule, left insula/subinsular white matter as well as cortex of the superior left temporal lobe. SWI signal loss at site of the acute infarcts within the left basal ganglia, likely reflecting petechial hemorrhage (Heidelberg 1b, HI 2- confluent petechiae, no mass effect). Mild petechial hemorrhage is also present along the left sylvian fissure. 2-3 mm acute cortical infarct within the left occipital lobe (series 2, image 30). Small-to-moderate volume acute  subarachnoid hemorrhage along the left cerebral hemisphere, not significantly increased in volume as compared to the postprocedural head CT yesterday, but with interval redistribution. No evidence of an intracranial mass. No midline shift. Vascular: Maintained flow voids within the proximal large arterial vessels. Skull and upper cervical spine: No focal suspicious marrow lesion. Visualized orbits show no acute finding. Bilateral lens replacements. Trace mucosal thickening within the bilateral ethmoid, sphenoid and right maxillary sinuses. Sinuses/Orbits: Maintained flow voids within the proximal large arterial vessels. IMPRESSION: Acute left MCA territory infarcts within the left basal ganglia, anterior limb of left internal  capsule, left insula/subinsular white matter as well as superior left temporal cortex. Susceptibility-weighted signal loss at site of the acute infarcts within the left basal ganglia, likely reflecting petechial hemorrhage (Heidelberg 1b, HI2- confluent petechiae, no mass effect). Consider a non-contrast head CT for CT characterization of this hemorrhage, and for a more direct comparison with the postprocedural head CT performed yesterday. Mild petechial hemorrhage along the left sylvian fissure. 2-3 mm acute cortical infarct within the left occipital lobe (left PCA territory). Small-to-moderate volume acute subarachnoid hemorrhage along the left cerebral hemisphere, not significantly changed in volume, but with interval redistribution. Electronically Signed: By: Jackey Loge D.O. On: 01/24/2021 17:21   ECHOCARDIOGRAM COMPLETE BUBBLE STUDY  Result Date: 01/24/2021    ECHOCARDIOGRAM REPORT   Patient Name:   [name] Date of Exam: 01/24/2021 Medical Rec #:  [id number]       Height:       66.0 in Accession #:    [id number]      Weight:       190.5 lb Date of Birth:  1970/12/23      BSA:          1.959 m Patient Age:    50 years        BP:           140/73 mmHg Patient Gender: F               HR:           101 bpm. Exam Location:  Inpatient Procedure: 2D Echo, Cardiac Doppler, Color Doppler and Saline Contrast Bubble            Study Indications:    Stroke  History:        Patient has no prior history of Echocardiogram examinations.                 Risk Factors:Hypertension, Diabetes and Dyslipidemia.  Sonographer:    Ross Ludwig RDCS (AE) Referring Phys: [id number] [address] [facility] IMPRESSIONS  1. Left ventricular ejection fraction, by estimation, is 65 to 70%. The left ventricle has normal function. The left ventricle has no regional wall motion abnormalities. Left ventricular diastolic parameters are consistent with Grade I diastolic dysfunction (impaired relaxation).  2. Right ventricular systolic function  is normal. The right ventricular size is normal. There is mildly elevated pulmonary artery systolic pressure.  3. Left atrial size was mildly dilated.  4. The mitral valve is normal in structure. No evidence of mitral valve regurgitation. No evidence of mitral stenosis.  5. The aortic valve is normal in structure. There is mild calcification of the aortic valve. Aortic valve regurgitation is not visualized. No aortic stenosis is present.  6. The inferior vena cava is normal in size with greater than 50% respiratory variability, suggesting right atrial pressure of 3 mmHg.  7. Agitated saline contrast bubble study was positive with shunting observed  within 3-6 cardiac cycles suggestive of interatrial shunt. The bubble study was mildly positive on the first few beats but markedly positive on the 5th beat. Suspect intracardiac shunting but may also have extracardai source. Suggest TEE to further evaluate, if clinically indicated. FINDINGS  Left Ventricle: Left ventricular ejection fraction, by estimation, is 65 to 70%. The left ventricle has normal function. The left ventricle has no regional wall motion abnormalities. The left ventricular internal cavity size was normal in size. There is  no left ventricular hypertrophy. Left ventricular diastolic parameters are consistent with Grade I diastolic dysfunction (impaired relaxation). Right Ventricle: The right ventricular size is normal. No increase in right ventricular wall thickness. Right ventricular systolic function is normal. There is mildly elevated pulmonary artery systolic pressure. The tricuspid regurgitant velocity is 3.04  m/s, and with an assumed right atrial pressure of 8 mmHg, the estimated right ventricular systolic pressure is 45.0 mmHg. Left Atrium: Left atrial size was mildly dilated. Right Atrium: Right atrial size was normal in size. Pericardium: There is no evidence of pericardial effusion. Mitral Valve: The mitral valve is normal in structure. No  evidence of mitral valve regurgitation. No evidence of mitral valve stenosis. MV peak gradient, 5.3 mmHg. The mean mitral valve gradient is 3.0 mmHg. Tricuspid Valve: The tricuspid valve is normal in structure. Tricuspid valve regurgitation is mild . No evidence of tricuspid stenosis. Aortic Valve: The aortic valve is normal in structure. There is mild calcification of the aortic valve. Aortic valve regurgitation is not visualized. No aortic stenosis is present. Aortic valve mean gradient measures 5.0 mmHg. Aortic valve peak gradient measures 10.0 mmHg. Aortic valve area, by VTI measures 2.43 cm. Pulmonic Valve: The pulmonic valve was normal in structure. Pulmonic valve regurgitation is not visualized. No evidence of pulmonic stenosis. Aorta: The aortic root is normal in size and structure. Venous: The inferior vena cava is normal in size with greater than 50% respiratory variability, suggesting right atrial pressure of 3 mmHg. IAS/Shunts: No atrial level shunt detected by color flow Doppler. Agitated saline contrast was given intravenously to evaluate for intracardiac shunting. Agitated saline contrast bubble study was positive with shunting observed within 3-6 cardiac cycles suggestive of interatrial shunt.  LEFT VENTRICLE PLAX 2D LVIDd:         4.10 cm   Diastology LVIDs:         2.40 cm   LV e' medial:    15.60 cm/s LV PW:         0.80 cm   LV E/e' medial:  5.3 LV IVS:        0.90 cm   LV e' lateral:   18.90 cm/s LVOT diam:     1.90 cm   LV E/e' lateral: 4.4 LV SV:         61 LV SV Index:   31 LVOT Area:     2.84 cm  RIGHT VENTRICLE             IVC RV Basal diam:  3.30 cm     IVC diam: 1.80 cm RV S prime:     17.70 cm/s TAPSE (M-mode): 2.7 cm LEFT ATRIUM             Index        RIGHT ATRIUM           Index LA diam:        3.60 cm 1.84 cm/m   RA Area:     14.80 cm LA Vol (  A2C):   41.8 ml 21.34 ml/m  RA Volume:   34.70 ml  17.72 ml/m LA Vol (A4C):   34.4 ml 17.56 ml/m LA Biplane Vol: 38.2 ml 19.50 ml/m   AORTIC VALVE AV Area (Vmax):    2.21 cm AV Area (Vmean):   2.22 cm AV Area (VTI):     2.43 cm AV Vmax:           158.00 cm/s AV Vmean:          105.000 cm/s AV VTI:            0.251 m AV Peak Grad:      10.0 mmHg AV Mean Grad:      5.0 mmHg LVOT Vmax:         123.00 cm/s LVOT Vmean:        82.300 cm/s LVOT VTI:          0.215 m LVOT/AV VTI ratio: 0.86  AORTA Ao Root diam: 3.20 cm Ao Asc diam:  2.80 cm MITRAL VALVE                TRICUSPID VALVE MV Area (PHT): 3.83 cm     TR Peak grad:   37.0 mmHg MV Area VTI:   2.29 cm     TR Vmax:        304.00 cm/s MV Peak grad:  5.3 mmHg MV Mean grad:  3.0 mmHg     SHUNTS MV Vmax:       1.15 m/s     Systemic VTI:  0.22 m MV Vmean:      80.5 cm/s    Systemic Diam: 1.90 cm MV Decel Time: 198 msec MV E velocity: 82.30 cm/s MV A velocity: 101.00 cm/s MV E/A ratio:  0.81 Arvilla Meres MD Electronically signed by Arvilla Meres MD Signature Date/Time: 01/24/2021/3:24:36 PM    Final    VAS [place] LOWER EXTREMITY VENOUS (DVT)  Result Date: 01/24/2021  Lower Venous DVT Study Patient Name:  [name]  Date of Exam:   01/24/2021 Medical Rec #: [id number]        Accession #:    [id number] Date of Birth: [birth year]       Patient Gender: F Patient Age:   39 years Exam Location:  [facility] Procedure:      VAS [place] LOWER EXTREMITY VENOUS (DVT) Referring Phys: Scheryl Marten Lesley Galentine --------------------------------------------------------------------------------  Indications: Stroke.  Risk Factors: None identified. Limitations: Poor ultrasound/tissue interface and bandages. Comparison Study: No prior studies. Performing Technologist: Chanda Busing RVT  Examination Guidelines: A complete evaluation includes B-mode imaging, spectral Doppler, color Doppler, and power Doppler as needed of all accessible portions of each vessel. Bilateral testing is considered an integral part of a complete examination. Limited examinations for reoccurring indications may be performed as noted. The  reflux portion of the exam is performed with the patient in reverse Trendelenburg.  +---------+---------------+---------+-----------+----------+--------------+  RIGHT     Compressibility Phasicity Spontaneity Properties Thrombus Aging  +---------+---------------+---------+-----------+----------+--------------+  CFV       Full            Yes       Yes                                    +---------+---------------+---------+-----------+----------+--------------+  SFJ       Full                                                             +---------+---------------+---------+-----------+----------+--------------+  FV Prox   Full                                                             +---------+---------------+---------+-----------+----------+--------------+  FV Mid    Full                                                             +---------+---------------+---------+-----------+----------+--------------+  FV Distal Full                                                             +---------+---------------+---------+-----------+----------+--------------+  PFV       Full                                                             +---------+---------------+---------+-----------+----------+--------------+  POP       Partial         Yes       Yes                    Acute           +---------+---------------+---------+-----------+----------+--------------+  PTV       Full                                                             +---------+---------------+---------+-----------+----------+--------------+  PERO      Full                                                             +---------+---------------+---------+-----------+----------+--------------+  Soleal    None                                             Acute           +---------+---------------+---------+-----------+----------+--------------+  Gastroc   None                                             Acute            +---------+---------------+---------+-----------+----------+--------------+   +---------+---------------+---------+-----------+----------+--------------+  LEFT      Compressibility Phasicity  Spontaneity Properties Thrombus Aging  +---------+---------------+---------+-----------+----------+--------------+  CFV       Full            Yes       Yes                                    +---------+---------------+---------+-----------+----------+--------------+  SFJ       Full                                                             +---------+---------------+---------+-----------+----------+--------------+  FV Prox   Full                                                             +---------+---------------+---------+-----------+----------+--------------+  FV Mid    Full                                                             +---------+---------------+---------+-----------+----------+--------------+  FV Distal Full                                                             +---------+---------------+---------+-----------+----------+--------------+  PFV       Full                                                             +---------+---------------+---------+-----------+----------+--------------+  POP       Full            Yes       Yes                                    +---------+---------------+---------+-----------+----------+--------------+  PTV       Full                                                             +---------+---------------+---------+-----------+----------+--------------+  PERO      Full                                                             +---------+---------------+---------+-----------+----------+--------------+  Gastroc   None                                             Acute           +---------+---------------+---------+-----------+----------+--------------+     Summary: RIGHT: - Findings consistent with acute deep vein thrombosis involving the right popliteal vein, right soleal  veins, and right gastrocnemius veins. - No cystic structure found in the popliteal fossa.  LEFT: - Findings consistent with acute deep vein thrombosis involving the left gastrocnemius veins. - No cystic structure found in the popliteal fossa.  *See table(s) above for measurements and observations. Electronically signed by Heath Lark on 01/24/2021 at 3:34:06 PM.    Final     PHYSICAL EXAM General:  Alert, well-developed female in no acute distress   NEURO:  Mental Status: AA&Ox3  Speech/Language: speech is without aphasia. Very mild hypophonia due to recent extubation.  Naming, fluency, and comprehension intact.  Cranial Nerves:  II: PERRL. Visual fields full.  III, IV, VI: EOMI. Eyelids elevate symmetrically.  V: Sensation is intact to light touch and symmetrical to face.  VII: Smile is symmetrical. Slight right nasolabial fold flattening. VIII: hearing intact to voice. IX, X:  Phonation is normal.  XII: tongue is midline without fasciculations. Motor: 5/5 strength to all muscle groups tested.  Sensation- Intact to light touch bilaterally. Extinction absent to light touch to DSS.  Coordination: FTN intact bilaterally, .No drift.  Gait- deferred   ASSESSMENT/PLAN Ms. NYKERRIA MACCONNELL is a 50 y.o. female with history of HTN, DM and HLD presenting with acute onset of aphasia and right sided weakness. She was found to have a left MCA stroke. TNK was given, and patient underwent successful thrombectomy with TICI 3 flow achieved.  Will order hypercoagulable workup and TEE given patient's age and family history of strokes in young people.  Stroke:  left MCA scattered infarct  likely secondary b/l DVTs in the setting of PFO. Hypercoagulable work up underway CT head Hyperdense left M1 suggesting embolic disease ASPECTS 9   CTA head & neck acute embolus of left M1 segment IR M2 occlusions with TICI3 revascularization Post IR CT mild left perinsular subarachnoid hyperattenuation MRI Acute left  MCA territory infarcts within the left basal ganglia, anterior limb of left internal capsule, left insula/subinsular white matter as well as superior left temporal cortex CT head No evidence of hemorrhagic transformation by CT  LE venous duplex- positive for acute DVT in left gastrocnemius veins, acute DVT in right popliteal vein, right soleal veins, and right gastrocnemius vein 2D Echo Bubble study positive for PFO, EF 65-70%, grade I diastolic dysfunction.  Will consider TEE for further evaluation  LDL 73 HgbA1c 11.1 Hypercoagulable workup pending VTE prophylaxis - heparin drip No antithrombotic prior to admission, now on heparin gtt per stroke protocol Therapy recommendations:  pending Disposition:  pending  Bilateral DVTs LE venous duplex- positive for acute DVT in left gastrocnemius veins, acute DVT in right popliteal vein, right soleal veins, and right gastrocnemius vein No obvious provoking factors Family history of clotting disorder - "pt grandpa had stroke at age 16, mom has afib on AC. Aunt had stroke in young age and sister died of PHTN" Hypercoagulable work-up pending On heparin IV drip per stroke protocol  PFO 2D Echo Bubble study positive for PFO Will consider TEE for further evaluation Will discuss  with Dr. Excell Seltzer to see if she could be PFO closure candidate in the future  Hypertension Home meds:  lisinopril-HCTZ 1-/12.5 Stable, Cleviprex d/c'd Restart home lisiniopril and HCTZ Long-term BP goal normotensive  Hyperlipidemia Home meds:  atorvastatin 20 mg daily LDL 73, goal < 70 Increase atorvastatin to 40 mg daily  Continue statin at discharge  Diabetes type II Uncontrolled Home meds:  metformin 500 mg daily HgbA1c 11.1, goal < 7.0 CBGs Diabetes coordinator consult [facility] PCP follow-up for better DM control  Other Stroke Risk Factors Obesity, Body mass index is 30.74 kg/m., BMI >/= 30 associated with increased stroke risk, recommend weight loss, diet  and exercise as appropriate  Family hx stroke (in multiple family members at young ages) Family history of young stroke in aunt and grandpa  Other Active Problems Hypokalemia- replaced  Hospital day # 2  Patient seen and examined by NP/APP with MD. MD to update note as needed.   Elmer Picker, DNP, FNP-BC [facility] Pager: [phone]  ATTENDING NOTE: I reviewed above note and agree with the assessment and plan. Pt was seen and examined.   Husband at the bedside. Pt lying in bed. They told me that pt grandpa had stroke at age 69, mom has afib on [facility]. Aunt had stroke in young age and sister died of PHTN. Pt was found to have b/l DVT and 2D echo showed PFO. MRI showed left MCA scattered infarcts with petechial hemorrhage. Now on ASA. Will repeat CT head, if no hemorrhage seen on CT, will start heparin IV per stroke protocol. Will consider TEE and potential PFO closure in the future. Hypercoagulable work up pending.    On exam, patient awake, alert, eyes open, orientated to age, place, time and people. No aphasia, fluent language, following all simple commands. Able to name and repeat and read. No gaze palsy, tracking bilaterally, visual field full, PERRL. Slight right nasolabial fold flattening. Tongue midline. Bilateral UEs 5/5, no drift. Bilaterally LEs 5/5, no drift. Sensation symmetrical bilaterally, b/l FTN intact, gait not tested.  For detailed assessment and plan, please refer to above as I have made changes wherever appropriate.   Marvel Plan, MD PhD Stroke Neurology 01/25/2021 6:30 PM  ADDENDUM: CT head repeat showed no evidence of hemorrhage transformation.  Will start heparin IV per stroke protocol.  DC aspirin 81.  Continue stroke work-up with hypercoagulable labs, TEE.  We will discuss with Dr. Excell Seltzer to see if patient could be PFO closure candidate in the future.  Consult diabetic coordinator for uncontrolled diabetes.  Continue Lipitor.  PT/OT  pending.  Marvel Plan, MD PhD Stroke Neurology 01/25/2021 9:44 PM  This patient is critically ill due to embolic stroke, bilateral DVTs, family history of clotting disorder PFO, and uncontrolled diabetes,  and at significant risk of neurological worsening, death form recurrent stroke, hemorrhagic conversion, bleeding from heparin IV, PE. This patient's care requires constant monitoring of vital signs, hemodynamics, respiratory and cardiac monitoring, review of multiple databases, neurological assessment, discussion with family, other specialists and medical decision making of high complexity. I spent 45\ minutes of neurocritical care time in the care of this patient. I had long discussion with patient and husband at bedside, updated pt current condition, treatment plan and potential prognosis, and answered all the questions.  They expressed understanding and appreciation.      To contact Stroke Continuity provider, please refer to [email or web address]. After hours, contact General Neurology

## 2021-01-25 NOTE — Progress Notes (Signed)
°  Transition of Care [facility]) Screening Note   Patient Details  Name: [name] Date of Birth: 10/30/1970   Transition of Care [facility] CM/SW Contact:    Baldemar Lenis, LCSW Phone Number: 01/25/2021, 3:23 PM    Transition of Care Department [facility]) has reviewed patient and no TOC needs have been identified at this time; medical workup ongoing. We will continue to monitor patient advancement through interdisciplinary progression rounds. If new patient transition needs arise, please place a TOC consult.

## 2021-01-25 NOTE — Progress Notes (Signed)
PT Cancellation Note  Patient Details Name: [name] MRN: [id number] DOB: 1970/12/02   Cancelled Treatment:    Reason Eval/Treat Not Completed: Patient not medically ready. Pt was found to have bilat LEs DVTs. Pt at CT checking for further bleeding in brain prior to starting IV heparin. PT to return s/p 24 hours of pt being on heparin to as appropriate to complete PT eval.  Lewis Shock, PT, DPT Acute Rehabilitation Services Pager #: [phone] Office #: [phone]    Iona Hansen 01/25/2021, 11:42 AM

## 2021-01-25 NOTE — Progress Notes (Signed)
K+ 3.4, ok to replace with x1 per Dr. Roda Shutters.  Ulyses Southward, PharmD, BCIDP, AAHIVP, CPP Infectious Disease Pharmacist 01/25/2021 9:44 AM

## 2021-01-25 NOTE — Evaluation (Signed)
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  HPI:  [name] is a 50 y.o. female with history of HTN, DM and HLD presenting with acute onset of aphasia and right sided weakness. She was found to have a left MCA stroke. TNK was given, and patient underwent successful thrombectomy   Assessment / Plan / Recommendation Clinical Impression  Pt with no errors on WAB, no signs of aphasia, cognition WNL. Pt denies difficulty or concerns. WIll sign off no SLP f/u needed.    SLP Assessment       Recommendations for follow up therapy are one component of a multi-disciplinary discharge planning process, led by the attending physician.  Recommendations may be updated based on patient status, additional functional criteria and insurance authorization.                   SLP Evaluation Cognition  Overall Cognitive Status: Within Functional Limits for tasks assessed Orientation Level: Oriented X4       Comprehension  Auditory Comprehension Overall Auditory Comprehension: Appears within functional limits for tasks assessed    Expression Verbal Expression Overall Verbal Expression: Appears within functional limits for tasks assessed   Oral / Motor  Motor Speech Overall Motor Speech: Appears within functional limits for tasks assessed            Latrece Nitta, Riley Nearing 01/25/2021, 11:25 AM

## 2021-01-25 NOTE — Progress Notes (Signed)
OT Cancellation Note  Patient Details Name: METTIE ROYLANCE MRN: [id number] DOB: 03-13-1970   Cancelled Treatment:    Reason Eval/Treat Not Completed: Patient not medically ready  Burnett Corrente Nakyah Erdmann, OT/L   Acute OT Clinical Specialist Acute Rehabilitation Services Pager [phone] Office [phone]  01/25/2021, 12:15 PM

## 2021-01-25 NOTE — Progress Notes (Signed)
Inpatient Diabetes Program Recommendations  AACE/ADA: New Consensus Statement on Inpatient Glycemic Control (2015)  Target Ranges:  Prepandial:   less than 140 mg/dL      Peak postprandial:   less than 180 mg/dL (1-2 hours)      Critically ill patients:  140 - 180 mg/dL   Lab Results  Component Value Date   GLUCAP 200 (H) 01/25/2021   HGBA1C 11.1 (H) 01/24/2021    Review of Glycemic Control  Admit with: Code Stroke   History: DM   Home DM Meds: Metformin 500 mg daily   Current Orders: Novolog Resistant Correction Scale/ SSI (0-20 units) TID AC + HS  Inpatient Diabetes Program Recommendations:   Please consider: -Semglee 12 units qd ( 0.15 units/kg x 86.4 kg = 13 units) -Decrease Novolog correction to 0-15 units tid, 0-5 units hs  Will review A1c with pt. And discuss possibility of insulin @ discharge.  Thank you, [name]. Sharie Amorin, RN, MSN, CDE  Diabetes Coordinator Inpatient Glycemic Control Team Team Pager [phone] (8am-5pm) 01/25/2021 10:52 AM

## 2021-01-25 NOTE — Progress Notes (Addendum)
Inpatient Diabetes Program Recommendations  AACE/ADA: New Consensus Statement on Inpatient Glycemic Control (2015)  Target Ranges:  Prepandial:   less than 140 mg/dL      Peak postprandial:   less than 180 mg/dL (1-2 hours)      Critically ill patients:  140 - 180 mg/dL   Lab Results  Component Value Date   GLUCAP 210 (H) 01/25/2021   HGBA1C 11.1 (H) 01/24/2021    Review of Glycemic Control  Inpatient Diabetes Program Recommendations:   Spoke with pt, husband, and brother @ bedside about A1C results 11.1 (average blood glucose approximately 272 over the past 2-3 months) and explained what an A1C is, basic pathophysiology of DM Type 2, basic home care, basic diabetes diet nutrition principles, importance of checking CBGs and maintaining good CBG control to prevent long-term and short-term complications. Reviewed signs and symptoms of hyperglycemia and hypoglycemia and how to treat hypoglycemia at home. Also reviewed blood sugar goals at home.  RNs to provide ongoing basic DM education at bedside with this patient. Have ordered educational booklet and insulin starter kit.  Reviewed basic nutrition plate method with patient. Patient states she only drinks water and diet drinks, but eats increased bread, sweets and pasta. States willingness to decrease carbohydrates along with following up with doctors and physical therapy and increase exercise as prescribed by physicians. Patient states she took Metformin 500 mg qd and tolerated well and A1c @ that time she thinks was 10.0.   Patient states willingness to take insulin if prescribed and Educated patient and spouse on insulin pen use at home. Reviewed contents of insulin flexpen starter kit. Reviewed all steps if insulin pen including attachment of needle, 2-unit air shot, dialing up dose, giving injection, removing needle, disposal of sharps, storage of unused insulin, disposal of insulin etc. Patient able to provide successful return  demonstration. Also reviewed troubleshooting with insulin pen. MD to give patient Rxs for insulin pens and insulin pen needles if prescribed @ discharge.  Discussed with RN Candace Cruise and plans to start teaching insulin administration and allow patient to give own injections.  Thank you, Nani Gasser. Tysheka Fanguy, RN, MSN, CDE  Diabetes Coordinator Inpatient Glycemic Control Team Team Pager [phone] (8am-5pm) 01/25/2021 1:39 PM

## 2021-01-25 NOTE — Progress Notes (Signed)
ANTICOAGULATION CONSULT NOTE - Initial Consult  Pharmacy Consult for heparin Indication: DVT  No Known Allergies  Patient Measurements: Height: 5' 6"  (167.6 cm) Weight: 86.4 kg (190 lb 7.6 oz) IBW/kg (Calculated) : 59.3 Heparin Dosing Weight: 77kg  Vital Signs: Temp: 99.3 F (37.4 C) (12/26 1200) Temp Source: Oral (12/26 1200) BP: 104/89 (12/26 1300) Pulse Rate: 91 (12/26 1300)  Labs: Recent Labs    01/23/21 1501 01/23/21 1509 01/23/21 1952 01/24/21 0509 01/25/21 0558  HGB 13.5 13.6 11.6* 11.6* 10.9*  HCT 39.6 40.0 34.0* 33.6* 32.2*  PLT 236  --   --  205 174  APTT 24  --   --   --   --   LABPROT 12.9  --   --   --   --   INR 1.0  --   --   --   --   CREATININE 0.72 0.50  --  0.61 0.58    Estimated Creatinine Clearance: 92.1 mL/min (by C-G formula based on SCr of 0.58 mg/dL).   Medical History: No past medical history on file.  Medications:  Medications Prior to Admission  Medication Sig Dispense Refill Last Dose   atorvastatin (LIPITOR) 20 MG tablet Take 20 mg by mouth every evening.   01/22/2021   gabapentin (NEURONTIN) 100 MG capsule Take 100 mg by mouth every evening.   01/22/2021   lisinopril-hydrochlorothiazide (ZESTORETIC) 10-12.5 MG tablet Take 1 tablet by mouth every evening.   01/22/2021   metFORMIN (GLUCOPHAGE-XR) 500 MG 24 hr tablet Take 500 mg by mouth daily with supper.   01/22/2021   triamcinolone cream (KENALOG) 0.1 % Apply 1 application topically 2 (two) times daily as needed.   unknown   valACYclovir (VALTREX) 1000 MG tablet Take 2,000 mg by mouth See admin instructions. Take 2 tablets (2000 mg) by mouth twice daily for one day as needed for outbreaks   unknown   Scheduled:    stroke: mapping our early stages of recovery book   Does not apply Once   atorvastatin  40 mg Oral Daily   chlorhexidine gluconate (MEDLINE KIT)  15 mL Mouth Rinse BID   Chlorhexidine Gluconate Cloth  6 each Topical Daily   docusate sodium  100 mg Oral BID   heparin  injection (subcutaneous)  5,000 Units Subcutaneous Q8H   hydrochlorothiazide  25 mg Oral Daily   insulin aspart  0-20 Units Subcutaneous TID AC & HS   insulin starter kit- pen needles  1 kit Other Once   lisinopril  10 mg Oral Daily   living well with diabetes book   Does not apply Once   pantoprazole  40 mg Oral Daily   senna-docusate  1 tablet Oral BID   Infusions:   Assessment: Pt was admitted for new CVA and she received TNK and thrombectomy. Brain MRI showed some petechial hemorrhage and SAH. Doppler showed bilateral DVTs today. Plan is to use heparin for 48 hrs then oral anticoagulation.   Goal of Therapy:  Heparin level 0.3-0.5 units/ml Monitor platelets by anticoagulation protocol: Yes   Plan:  No bolus Heparin 1150 units/hr Check 6 hr HL Daily HL and CBC  Onnie Boer, PharmD, BCIDP, AAHIVP, CPP Infectious Disease Pharmacist 01/25/2021 1:38 PM

## 2021-01-26 LAB — LUPUS ANTICOAGULANT PANEL
DRVVT: 33.4 s (ref 0.0–47.0)
PTT Lupus Anticoagulant: 29.4 s (ref 0.0–51.9)

## 2021-01-26 LAB — BASIC METABOLIC PANEL
Anion gap: 11 (ref 5–15)
BUN: 16 mg/dL (ref 6–20)
CO2: 21 mmol/L — ABNORMAL LOW (ref 22–32)
Calcium: 8.9 mg/dL (ref 8.9–10.3)
Chloride: 103 mmol/L (ref 98–111)
Creatinine, Ser: 0.59 mg/dL (ref 0.44–1.00)
GFR, Estimated: >60 mL/min (ref >60)
Glucose, Bld: 233 mg/dL — ABNORMAL HIGH (ref 70–99)
Potassium: 3.9 mmol/L (ref 3.5–5.1)
Sodium: 135 mmol/L (ref 135–145)

## 2021-01-26 LAB — GLUCOSE, CAPILLARY
Glucose-Capillary: 212 mg/dL — ABNORMAL HIGH (ref 70–99)
Glucose-Capillary: 184 mg/dL — ABNORMAL HIGH (ref 70–99)
Glucose-Capillary: 202 mg/dL — ABNORMAL HIGH (ref 70–99)
Glucose-Capillary: 175 mg/dL — ABNORMAL HIGH (ref 70–99)

## 2021-01-26 LAB — CBC
HCT: 34.4 % — ABNORMAL LOW (ref 36.0–46.0)
Hemoglobin: 11.8 g/dL — ABNORMAL LOW (ref 12.0–15.0)
MCH: 32 pg (ref 26.0–34.0)
MCHC: 34.3 g/dL (ref 30.0–36.0)
MCV: 93.2 fL (ref 80.0–100.0)
Platelets: 166 10*3/uL (ref 150–400)
RBC: 3.69 MIL/uL — ABNORMAL LOW (ref 3.87–5.11)
RDW: 11.7 % (ref 11.5–15.5)
WBC: 10.2 10*3/uL (ref 4.0–10.5)
nRBC: 0 % (ref 0.0–0.2)

## 2021-01-26 LAB — BETA-2-GLYCOPROTEIN I ABS, IGG/M/A
Beta-2 Glyco I IgG: <9 GPI IgG units (ref 0–20)
Beta-2-Glycoprotein I IgA: <9 GPI IgA units (ref 0–25)
Beta-2-Glycoprotein I IgM: <9 GPI IgM units (ref 0–32)

## 2021-01-26 LAB — HEPARIN LEVEL (UNFRACTIONATED)
Heparin Unfractionated: 0.6 IU/mL (ref 0.30–0.70)
Heparin Unfractionated: 0.53 IU/mL (ref 0.30–0.70)

## 2021-01-26 LAB — PROTEIN S ACTIVITY: Protein S Activity: 72 % (ref 63–140)

## 2021-01-26 LAB — PROTEIN S, TOTAL: Protein S Ag, Total: 90 % (ref 60–150)

## 2021-01-26 LAB — HOMOCYSTEINE: Homocysteine: 7.9 umol/L (ref 0.0–14.5)

## 2021-01-26 LAB — PROTEIN C ACTIVITY: Protein C Activity: 148 % (ref 73–180)

## 2021-01-26 MED ORDER — INSULIN GLARGINE-YFGN 100 UNIT/ML ~~LOC~~ SOLN
10.0000 [IU] | Freq: Every day | SUBCUTANEOUS | Status: DC
  Administered 2021-01-26 – 2021-01-29 (×4): 10 [IU] via SUBCUTANEOUS
  Filled 2021-01-26 (×5): qty 0.1

## 2021-01-26 MED ORDER — BUTALBITAL-APAP-CAFFEINE 50-325-40 MG PO TABS
1.0000 | ORAL_TABLET | ORAL | Status: DC | PRN
  Administered 2021-01-27 – 2021-01-29 (×7): 1 via ORAL
  Filled 2021-01-26 (×7): qty 1

## 2021-01-26 NOTE — Progress Notes (Addendum)
ANTICOAGULATION CONSULT NOTE  Pharmacy Consult for IV Heparin Indication: DVT  No Known Allergies  Patient Measurements: Height: 5\' 6"  (167.6 cm) Weight: 86.4 kg (190 lb 7.6 oz) IBW/kg (Calculated) : 59.3 Heparin Dosing Weight: 77 kg  Vital Signs: Temp: 97.9 F (36.6 C) (12/27 0400) Temp Source: Oral (12/27 0400) BP: 137/79 (12/27 0400) Pulse Rate: 79 (12/27 0400)  Labs: Recent Labs    01/23/21 1501 01/23/21 1509 01/24/21 0509 01/25/21 0558 01/25/21 2034 01/26/21 0453  HGB 13.5   < > 11.6* 10.9*  --  11.8*  HCT 39.6   < > 33.6* 32.2*  --  34.4*  PLT 236  --  205 174  --  166  APTT 24  --   --   --   --   --   LABPROT 12.9  --   --   --   --   --   INR 1.0  --   --   --   --   --   HEPARINUNFRC  --   --   --   --  0.26* 0.60  CREATININE 0.72   < > 0.61 0.58  --  0.59   < > = values in this interval not displayed.     Estimated Creatinine Clearance: 92.1 mL/min (by C-G formula based on SCr of 0.59 mg/dL).   Assessment: 50 years of age female admitted with new CVA and received TNK and thrombectomy. Brain MRI with petechial hemorrhage and SAH. Doppler with bilateral DVTs. Pharmacy consulted to dose IV heparin, aiming for low goal with new CVA.   Heparin level is supra-therapeutic at 0.6 units/mL; no bleeding reported and CBC stable.  Goal of Therapy:  Heparin level 0.3 to 0.5 units/ml Monitor platelets by anticoagulation protocol: Yes   Plan:  Reduce heparin infusion to 1200 units/hr Check 6 hr HL  Daily heparin level and CBC  Nykiah Ma D. 43, PharmD, BCPS, BCCCP 01/26/2021, 7:02 AM  ==========================  Addendum: Repeat heparin level slightly high at 0.53 units/mL Reduce heparin infusion to 1150 units/hr F/U AM labs  Jenner Rosier D. 01/28/2021, PharmD, BCPS, BCCCP 01/26/2021, 3:15 PM

## 2021-01-26 NOTE — Evaluation (Signed)
Physical Therapy Evaluation Patient Details Name: [name] MRN: [id number] DOB: 10/22/1970 Today's Date: 01/26/2021  History of Present Illness  Pt is 50 yo female who presents with acute onset aphasia,R facial droop,  R sided weakness, L gaze preference. Pt given TNK and had emergent thrombectomy of L M1 thrombus. After procedure pt found to have petechial hemorrhage and small L SAH. Pt also with B LE DVT's. Bubble study pos for PFO.   PMH: HTN, DM II, HLD.  Clinical Impression  Pt admitted with above diagnosis. Pt received in bed, reports L frontal HA, 7/10 after Tylenol administration. Pt tests equally R and L for strength and sensation UE's and LE's. She was able to ambulate in hallway without AD and no LOB, supervision given. All movements and conversation slowed today and pt is aware of this and reports feeling extremely fatigued. Despite this she is appropriate with answering all questions. Will follow acutely to make sure this improves in coming days. Currently recommending outpt PT since she has been working full time and needs to return to that level of function.  Pt currently with functional limitations due to the deficits listed below (see PT Problem List). Pt will benefit from skilled PT to increase their independence and safety with mobility to allow discharge to the venue listed below.          Recommendations for follow up therapy are one component of a multi-disciplinary discharge planning process, led by the attending physician.  Recommendations may be updated based on patient status, additional functional criteria and insurance authorization.  Follow Up Recommendations Outpatient PT    Assistance Recommended at Discharge Intermittent Supervision/Assistance  Functional Status Assessment Patient has had a recent decline in their functional status and demonstrates the ability to make significant improvements in function in a reasonable and predictable amount of time.   Equipment Recommendations  None recommended by PT    Recommendations for Other Services       Precautions / Restrictions Precautions Precautions: Fall Restrictions Weight Bearing Restrictions: No      Mobility  Bed Mobility Overal bed mobility: Needs Assistance Bed Mobility: Supine to Sit;Sit to Supine     Supine to sit: Supervision Sit to supine: Supervision   General bed mobility comments: no physical assist needed to get out of bed or back in but all mvmts slowed today    Transfers Overall transfer level: Needs assistance Equipment used: None Transfers: Sit to/from Stand Sit to Stand: Supervision           General transfer comment: supervision for safety, pt reported mild dizziness that quickly passed. BP 124/85 in sitting, 130/80 in standing    Ambulation/Gait Ambulation/Gait assistance: Supervision Gait Distance (Feet): 300 Feet Assistive device: None Gait Pattern/deviations: Step-through pattern Gait velocity: decreased Gait velocity interpretation: 1.31 - 2.62 ft/sec, indicative of limited community ambulator Pre-gait activities: SL stance 20 secs each limb with supervision, tandem stance with min A to position but then supervision, 20 secs each side. Pt able to reach down to floor to pick up bottle. Pt able to turn 90 deg in each direction with no LOB or dizziness. General Gait Details: steady but slow gait with decreased arm swing  Stairs            Wheelchair Mobility    Modified Rankin (Stroke Patients Only) Modified Rankin (Stroke Patients Only) Pre-Morbid Rankin Score: No symptoms Modified Rankin: Slight disability     Balance Overall balance assessment: Modified Independent  Pertinent Vitals/Pain Pain Assessment: 0-10 Pain Score: 7  Pain Location: L forehead Pain Descriptors / Indicators: Headache Pain Intervention(s): Premedicated before session;Monitored during  session    Home Living Family/patient expects to be discharged to:: Private residence Living Arrangements: Spouse/significant other Available Help at Discharge: Family;Available 24 hours/day Type of Home: House Home Access: Stairs to enter   Entrance Stairs-Number of Steps: 1 Alternate Level Stairs-Number of Steps: flight Home Layout: Two level;Able to live on main level with bedroom/bathroom Home Equipment: None 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    Prior Function Prior Level of Function : Independent/Modified Independent;Working/employed;Driving                     Hand Dominance   Dominant Hand: Right    Extremity/Trunk Assessment   Upper Extremity Assessment Upper Extremity Assessment: Defer to OT evaluation    Lower Extremity Assessment Lower Extremity Assessment: Overall WFL for tasks assessed;RLE deficits/detail RLE Deficits / Details: notable swelling BLE's, pt reports this is her baseline, no tenderness or heat posterior calves with B DVT    Cervical / Trunk Assessment Cervical / Trunk Assessment: Normal  Communication   Communication: Other (comment) (mildly slow speech)  Cognition Arousal/Alertness: Awake/alert Behavior During Therapy: WFL for tasks assessed/performed Overall Cognitive Status: Within Functional Limits for tasks assessed                                 General Comments: WFL though pt reports feeling very tired today and responses to questions accurate but somewhat slowed        General Comments General comments (skin integrity, edema, etc.): HA did not increase or decrease with activity. Pt denies visual deficits and has been able to read.  Husband present on eval.    Exercises     Assessment/Plan    PT Assessment Patient needs continued PT services  PT Problem List Decreased balance;Decreased mobility;Obesity;Pain       PT Treatment Interventions Gait training;Stair training;Functional mobility  training;Therapeutic activities;Therapeutic exercise;Balance training;Patient/family education    PT Goals (Current goals can be found in the Care Plan section)  Acute Rehab PT Goals Patient Stated Goal: return to home and enjoying grandkids PT Goal Formulation: With patient Time For Goal Achievement: 02/09/21 Potential to Achieve Goals: Good    Frequency Min 4X/week   Barriers to discharge        Co-evaluation               AM-PAC PT "6 Clicks" Mobility  Outcome Measure Help needed turning from your back to your side while in a flat bed without using bedrails?: None Help needed moving from lying on your back to sitting on the side of a flat bed without using bedrails?: None Help needed moving to and from a bed to a chair (including a wheelchair)?: A Little Help needed standing up from a chair using your arms (e.g., wheelchair or bedside chair)?: A Little Help needed to walk in hospital room?: A Little Help needed climbing 3-5 steps with a railing? : A Little 6 Click Score: 20    End of Session Equipment Utilized During Treatment: Gait belt Activity Tolerance: Patient tolerated treatment well Patient left: in bed;with call bell/phone within reach;with bed alarm set;with family/visitor present Nurse Communication: Mobility status PT Visit Diagnosis: Unsteadiness on feet (R26.81)    Time: 1200-1230 PT Time Calculation (min) (ACUTE ONLY): 30 min   Charges:  PT Evaluation $PT Eval Moderate Complexity: 1 Mod PT Treatments $Gait Training: 8-22 mins        Lyanne Co, PT  Acute Rehab Services  Pager [phone] Office [phone]   Lawana Chambers Quadry Kampa 01/26/2021, 1:17 PM

## 2021-01-26 NOTE — Progress Notes (Cosign Needed)
Supervising Physician: Julieanne Cotton  Patient Status:  [facility] - In-pt  Chief Complaint:  stroke  Subjective:  Patient reclining in bed with husband at bedside.  Reports left frontal headache, improved with Tylenol.  Making progress with ambulating, but reports being limited by fatigue.  Does not endorse any residual deficits.    Allergies: Patient has no known allergies.  Medications: Prior to Admission medications   Medication Sig Start Date End Date Taking? Authorizing Provider  atorvastatin (LIPITOR) 20 MG tablet Take 20 mg by mouth every evening. 12/31/20  Yes [provider]  gabapentin (NEURONTIN) 100 MG capsule Take 100 mg by mouth every evening. 01/21/21  Yes [provider]  lisinopril-hydrochlorothiazide (ZESTORETIC) 10-12.5 MG tablet Take 1 tablet by mouth every evening. 01/19/21  Yes [provider]  metFORMIN (GLUCOPHAGE-XR) 500 MG 24 hr tablet Take 500 mg by mouth daily with supper. 01/21/21  Yes [provider]  triamcinolone cream (KENALOG) 0.1 % Apply 1 application topically 2 (two) times daily as needed. 12/31/20  Yes [provider]  valACYclovir (VALTREX) 1000 MG tablet Take 2,000 mg by mouth See admin instructions. Take 2 tablets (2000 mg) by mouth twice daily for one day as needed for outbreaks 12/31/20  Yes [provider]     Vital Signs: BP 132/81    Pulse 67    Temp 97.7 F (36.5 C) (Oral)    Resp 16    Ht  (1.676 m)    Wt 190 lb 7.6 oz (86.4 kg)    SpO2 94%    BMI 30.74 kg/m   Physical Exam Vitals reviewed.  Constitutional:      General: She is not in acute distress. HENT:     Head: Normocephalic and atraumatic.     Mouth/Throat:     Pharynx: Oropharynx is clear.  Eyes:     Extraocular Movements: Extraocular movements intact.  Cardiovascular:     Rate and Rhythm: Normal rate.     Pulses: Normal pulses.     Comments: Right femoral access site is soft and without hematoma. Dressing is  dry and intact. Pulmonary:     Effort: Pulmonary effort is normal. No respiratory distress.  Musculoskeletal:     Right lower leg: Edema present.     Left lower leg: Edema present.  Skin:    General: Skin is warm and dry.     Capillary Refill: Capillary refill takes 2 to 3 seconds.  Neurological:     General: No focal deficit present.     Mental Status: She is alert.     Cranial Nerves: No cranial nerve deficit.     Comments: Grip strength equal bilaterally, but weaker than expected.    Imaging: CT HEAD WO CONTRAST ( )  Result Date: 01/25/2021 CLINICAL DATA:  Neuro deficit, acute, stroke suspected.  Follow-up. EXAM: CT HEAD WITHOUT CONTRAST TECHNIQUE: Contiguous axial images were obtained from the base of the skull through the vertex without intravenous contrast. COMPARISON:  MRI yesterday.  CT studies 01/23/2021. FINDINGS: Brain: No abnormality affects the brainstem or cerebellum. The right cerebral hemisphere is normal. There is swelling in the left basal ganglia and to a lesser extent in the inferior insula consistent with infarctions shown by MRI. No evidence of hemorrhagic transformation. No new insult is seen. No hydrocephalus or extra-axial collection. Vascular: No abnormal vascular finding. Skull: Negative Sinuses/Orbits: Clear/normal Other: None IMPRESSION: Low-density and swelling in the left basal ganglia and the inferior insula consistent with the areas  of acute infarction shown by MRI. No evidence of hemorrhagic transformation by CT or of midline shift. Electronically Signed   By: Paulina Fusi M.D.   On: 01/25/2021 12:40   MR BRAIN WO CONTRAST  Addendum Date: 01/24/2021   ADDENDUM REPORT: 01/24/2021 17:38 ADDENDUM: Findings in impressions 2 and 4 called to Dr. Viviann Spare by telephone at approximately 5:30 p.m. on 01/24/2021. Electronically Signed   By: Jackey Loge D.O.   On: 01/24/2021 17:38   Result Date: 01/24/2021 CLINICAL DATA:  Provided history: Stroke, follow-up. EXAM:  MRI HEAD WITHOUT CONTRAST TECHNIQUE: Multiplanar, multiecho pulse sequences of the brain and surrounding structures were obtained without intravenous contrast. COMPARISON:  CT angiogram head/neck and non-contrast head CT 01/23/2021. FINDINGS: Brain: Cerebral volume is normal. Acute infarcts involving much of the left caudate body and left putamen. Small acute infarcts are also present within the anterior limb of internal capsule, left insula/subinsular white matter as well as cortex of the superior left temporal lobe. SWI signal loss at site of the acute infarcts within the left basal ganglia, likely reflecting petechial hemorrhage (Heidelberg 1b, HI 2- confluent petechiae, no mass effect). Mild petechial hemorrhage is also present along the left sylvian fissure. 2-3 mm acute cortical infarct within the left occipital lobe (series 2, image 30). Small-to-moderate volume acute subarachnoid hemorrhage along the left cerebral hemisphere, not significantly increased in volume as compared to the postprocedural head CT yesterday, but with interval redistribution. No evidence of an intracranial mass. No midline shift. Vascular: Maintained flow voids within the proximal large arterial vessels. Skull and upper cervical spine: No focal suspicious marrow lesion. Visualized orbits show no acute finding. Bilateral lens replacements. Trace mucosal thickening within the bilateral ethmoid, sphenoid and right maxillary sinuses. Sinuses/Orbits: Maintained flow voids within the proximal large arterial vessels. 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 Susceptibility-weighted signal loss at site of the acute infarcts within the left basal ganglia, likely reflecting petechial hemorrhage (Heidelberg 1b, HI2- confluent petechiae, no mass effect). Consider a non-contrast head CT for CT  characterization of this hemorrhage, and for a more direct comparison with the postprocedural head CT performed yesterday. Mild petechial hemorrhage along the left sylvian fissure. 2-3 mm acute cortical infarct within the left occipital lobe (left PCA territory). Small-to-moderate volume acute subarachnoid hemorrhage along the left cerebral hemisphere, not significantly changed in volume, but with interval redistribution. Electronically Signed: By: Jackey Loge D.O. On: 01/24/2021 17:21   ECHOCARDIOGRAM COMPLETE BUBBLE STUDY  Result Date: 01/24/2021    ECHOCARDIOGRAM REPORT   Patient Name:   HERMIONE HAVLICEK Date of Exam: 01/24/2021 Medical Rec #:  [id number]       Height:       66.0 in Accession #:    [id number]      Weight:       190.5 lb Date of Birth:  May 19, 1970      BSA:          1.959 m Patient Age:    50 years        BP:           140/73 mmHg Patient Gender: F               HR:           101 bpm. Exam Location:  Inpatient Procedure: 2D Echo, Cardiac Doppler, Color Doppler and Saline Contrast Bubble  Study Indications:    Stroke  History:        Patient has no prior history of Echocardiogram examinations.                 Risk Factors:Hypertension, Diabetes and Dyslipidemia.  Sonographer:    Ross Ludwig RDCS (AE) Referring Phys: [id number] [facility] IMPRESSIONS  1. Left ventricular ejection fraction, by estimation, is 65 to 70%. The left ventricle has normal function. The left ventricle has no regional wall motion abnormalities. Left ventricular diastolic parameters are consistent with Grade I diastolic dysfunction (impaired relaxation).  2. Right ventricular systolic function is normal. The right ventricular size is normal. There is mildly elevated pulmonary artery systolic pressure.  3. Left atrial size was mildly dilated.  4. The mitral valve is normal in structure. No evidence of mitral valve regurgitation. No evidence of mitral stenosis.  5. The aortic valve is normal in structure. There  is mild calcification of the aortic valve. Aortic valve regurgitation is not visualized. No aortic stenosis is present.  6. The inferior vena cava is normal in size with greater than 50% respiratory variability, suggesting right atrial pressure of 3 mmHg.  7. Agitated saline contrast bubble study was positive with shunting observed within 3-6 cardiac cycles suggestive of interatrial shunt. The bubble study was mildly positive on the first few beats but markedly positive on the 5th beat. Suspect intracardiac shunting but may also have extracardai source. Suggest TEE to further evaluate, if clinically indicated. FINDINGS  Left Ventricle: Left ventricular ejection fraction, by estimation, is 65 to 70%. The left ventricle has normal function. The left ventricle has no regional wall motion abnormalities. The left ventricular internal cavity size was normal in size. There is  no left ventricular hypertrophy. Left ventricular diastolic parameters are consistent with Grade I diastolic dysfunction (impaired relaxation). Right Ventricle: The right ventricular size is normal. No increase in right ventricular wall thickness. Right ventricular systolic function is normal. There is mildly elevated pulmonary artery systolic pressure. The tricuspid regurgitant velocity is 3.04  m/s, and with an assumed right atrial pressure of 8 mmHg, the estimated right ventricular systolic pressure is 45.0 mmHg. Left Atrium: Left atrial size was mildly dilated. Right Atrium: Right atrial size was normal in size. Pericardium: There is no evidence of pericardial effusion. Mitral Valve: The mitral valve is normal in structure. No evidence of mitral valve regurgitation. No evidence of mitral valve stenosis. MV peak gradient, 5.3 mmHg. The mean mitral valve gradient is 3.0 mmHg. Tricuspid Valve: The tricuspid valve is normal in structure. Tricuspid valve regurgitation is mild . No evidence of tricuspid stenosis. Aortic Valve: The aortic valve is  normal in structure. There is mild calcification of the aortic valve. Aortic valve regurgitation is not visualized. No aortic stenosis is present. Aortic valve mean gradient measures 5.0 mmHg. Aortic valve peak gradient measures 10.0 mmHg. Aortic valve area, by VTI measures 2.43 cm. Pulmonic Valve: The pulmonic valve was normal in structure. Pulmonic valve regurgitation is not visualized. No evidence of pulmonic stenosis. Aorta: The aortic root is normal in size and structure. Venous: The inferior vena cava is normal in size with greater than 50% respiratory variability, suggesting right atrial pressure of 3 mmHg. IAS/Shunts: No atrial level shunt detected by color flow Doppler. Agitated saline contrast was given intravenously to evaluate for intracardiac shunting. Agitated saline contrast bubble study was positive with shunting observed within 3-6 cardiac cycles suggestive of interatrial shunt.  LEFT VENTRICLE PLAX 2D LVIDd:  4.10 cm   Diastology LVIDs:         2.40 cm   LV e' medial:    15.60 cm/s LV PW:         0.80 cm   LV E/e' medial:  5.3 LV IVS:        0.90 cm   LV e' lateral:   18.90 cm/s LVOT diam:     1.90 cm   LV E/e' lateral: 4.4 LV SV:         61 LV SV Index:   31 LVOT Area:     2.84 cm  RIGHT VENTRICLE             IVC RV Basal diam:  3.30 cm     IVC diam: 1.80 cm RV S prime:     17.70 cm/s TAPSE (M-mode): 2.7 cm LEFT ATRIUM             Index        RIGHT ATRIUM           Index LA diam:        3.60 cm 1.84 cm/m   RA Area:     14.80 cm LA Vol (A2C):   41.8 ml 21.34 ml/m  RA Volume:   34.70 ml  17.72 ml/m LA Vol (A4C):   34.4 ml 17.56 ml/m LA Biplane Vol: 38.2 ml 19.50 ml/m  AORTIC VALVE AV Area (Vmax):    2.21 cm AV Area (Vmean):   2.22 cm AV Area (VTI):     2.43 cm AV Vmax:           158.00 cm/s AV Vmean:          105.000 cm/s AV VTI:            0.251 m AV Peak Grad:      10.0 mmHg AV Mean Grad:      5.0 mmHg LVOT Vmax:         123.00 cm/s LVOT Vmean:        82.300 cm/s LVOT VTI:           0.215 m LVOT/AV VTI ratio: 0.86  AORTA Ao Root diam: 3.20 cm Ao Asc diam:  2.80 cm MITRAL VALVE                TRICUSPID VALVE MV Area (PHT): 3.83 cm     TR Peak grad:   37.0 mmHg MV Area VTI:   2.29 cm     TR Vmax:        304.00 cm/s MV Peak grad:  5.3 mmHg MV Mean grad:  3.0 mmHg     SHUNTS MV Vmax:       1.15 m/s     Systemic VTI:  0.22 m MV Vmean:      80.5 cm/s    Systemic Diam: 1.90 cm MV Decel Time: 198 msec MV E velocity: 82.30 cm/s MV A velocity: 101.00 cm/s MV E/A ratio:  0.81 Arvilla Meres MD Electronically signed by Arvilla Meres MD Signature Date/Time: 01/24/2021/3:24:36 PM    Final    CT HEAD CODE STROKE WO CONTRAST  Result Date: 01/23/2021 CLINICAL DATA:  Code stroke. Neuro deficit, acute, stroke suspected. EXAM: CT HEAD WITHOUT CONTRAST TECHNIQUE: Contiguous axial images were obtained from the base of the skull through the vertex without intravenous contrast. COMPARISON:  None. FINDINGS: Brain: No abnormality of the brainstem or cerebellum. Equivocal loss of gray-white differentiation in the left temporal lobe. No other brain  parenchymal finding. Vascular: Apparent hyperdense left M1 suggest embolic disease. Skull: Normal Sinuses/Orbits: Clear/normal Other: None ASPECTS ([place] Stroke Program Early CT Score) - Ganglionic level infarction (caudate, lentiform nuclei, internal capsule, insula, M1-M3 cortex): 6 - Supraganglionic infarction (M4-M6 cortex): 3 Total score (0-10 with 10 being normal): 9 IMPRESSION: 1. Question loss of gray-white differentiation in the left temporal lobe. Otherwise normal appearance of the brain parenchyma. Apparent hyperdense left M1 suggesting embolic disease. 2. ASPECTS is 9 3. These results were communicated to Dr. Thomasena Edis at 3:19 pm on 01/23/2021 by text page via the [facility] messaging system. Electronically Signed   By: Paulina Fusi M.D.   On: 01/23/2021 15:19   VAS [place] LOWER EXTREMITY VENOUS (DVT)  Result Date: 01/24/2021  Lower Venous DVT Study  Patient Name:  LUNDYNN COHOON  Date of Exam:   01/24/2021 Medical Rec #: [id number]        Accession #:    [id number] Date of Birth: 1970/05/20       Patient Gender: F Patient Age:   36 years Exam Location:  [facility] Procedure:      VAS [place] LOWER EXTREMITY VENOUS (DVT) Referring Phys: Scheryl Marten XU --------------------------------------------------------------------------------  Indications: Stroke.  Risk Factors: None identified. Limitations: Poor ultrasound/tissue interface and bandages. Comparison Study: No prior studies. Performing Technologist: Chanda Busing RVT  Examination Guidelines: A complete evaluation includes B-mode imaging, spectral Doppler, color Doppler, and power Doppler as needed of all accessible portions of each vessel. Bilateral testing is considered an integral part of a complete examination. Limited examinations for reoccurring indications may be performed as noted. The reflux portion of the exam is performed with the patient in reverse Trendelenburg.  +---------+---------------+---------+-----------+----------+--------------+  RIGHT     Compressibility Phasicity Spontaneity Properties Thrombus Aging  +---------+---------------+---------+-----------+----------+--------------+  CFV       Full            Yes       Yes                                    +---------+---------------+---------+-----------+----------+--------------+  SFJ       Full                                                             +---------+---------------+---------+-----------+----------+--------------+  FV Prox   Full                                                             +---------+---------------+---------+-----------+----------+--------------+  FV Mid    Full                                                             +---------+---------------+---------+-----------+----------+--------------+  FV Distal Full                                                              +---------+---------------+---------+-----------+----------+--------------+  PFV       Full                                                             +---------+---------------+---------+-----------+----------+--------------+  POP       Partial         Yes       Yes                    Acute           +---------+---------------+---------+-----------+----------+--------------+  PTV       Full                                                             +---------+---------------+---------+-----------+----------+--------------+  PERO      Full                                                             +---------+---------------+---------+-----------+----------+--------------+  Soleal    None                                             Acute           +---------+---------------+---------+-----------+----------+--------------+  Gastroc   None                                             Acute           +---------+---------------+---------+-----------+----------+--------------+   +---------+---------------+---------+-----------+----------+--------------+  LEFT      Compressibility Phasicity Spontaneity Properties Thrombus Aging  +---------+---------------+---------+-----------+----------+--------------+  CFV       Full            Yes       Yes                                    +---------+---------------+---------+-----------+----------+--------------+  SFJ       Full                                                             +---------+---------------+---------+-----------+----------+--------------+  FV Prox   Full                                                             +---------+---------------+---------+-----------+----------+--------------+  FV Mid    Full                                                             +---------+---------------+---------+-----------+----------+--------------+  FV Distal Full                                                              +---------+---------------+---------+-----------+----------+--------------+  PFV       Full                                                             +---------+---------------+---------+-----------+----------+--------------+  POP       Full            Yes       Yes                                    +---------+---------------+---------+-----------+----------+--------------+  PTV       Full                                                             +---------+---------------+---------+-----------+----------+--------------+  PERO      Full                                                             +---------+---------------+---------+-----------+----------+--------------+  Gastroc   None                                             Acute           +---------+---------------+---------+-----------+----------+--------------+     Summary: RIGHT: - Findings consistent with acute deep vein thrombosis involving the right popliteal vein, right soleal veins, and right gastrocnemius veins. - No cystic structure found in the popliteal fossa.  LEFT: - Findings consistent with acute deep vein thrombosis involving the left gastrocnemius veins. - No cystic structure found in the popliteal fossa.  *See table(s) above for measurements and observations. Electronically signed by Heath Lark on 01/24/2021 at 3:34:06 PM.    Final    CT ANGIO HEAD CODE STROKE  Result Date: 01/23/2021 CLINICAL DATA:  Acute deficit, stroke suspected. Abnormal head CT. Left hemisphere insult. EXAM: CT ANGIOGRAPHY HEAD AND NECK TECHNIQUE: Multidetector CT imaging of the head and neck was performed using the standard protocol during bolus administration of intravenous contrast.  Multiplanar CT image reconstructions and MIPs were obtained to evaluate the vascular anatomy. Carotid stenosis measurements (when applicable) are obtained utilizing NASCET criteria, using the distal internal carotid diameter as the denominator. CONTRAST:  75mL OMNIPAQUE  IOHEXOL 350 MG/ML SOLN COMPARISON:  Head CT earlier same day FINDINGS: CTA NECK FINDINGS Aortic arch: Normal Right carotid system: Common carotid artery widely patent to the bifurcation. Calcified plaque at the carotid bifurcation but no stenosis. Cervical ICA is widely patent. Left carotid system: Common carotid artery widely patent to the bifurcation. Calcified plaque at the carotid bifurcation and ICA bulb but no stenosis. Cervical ICA widely patent beyond that. Vertebral arteries: Both vertebral artery origins are widely patent. The left is dominant. Both vertebral arteries are patent through the cervical region to the foramen magnum. Skeleton: Scoliotic curvature which could be positional. No acute finding. Other neck: No soft tissue mass or lymphadenopathy. Upper chest: Negative Review of the MIP images confirms the above findings CTA HEAD FINDINGS Anterior circulation: Both internal carotid arteries are patent through the skull base and siphon region. On the right, the anterior and middle cerebral vessels are patent without large vessel occlusion or stenosis. On the left, the anterior cerebral artery is widely patent. There is an embolus within the M1 segment extending over a length of about 8 mm. This corresponds with the hyperdense vessel on the CT. This results in marked stenosis of the vessel but not complete occlusion. More distal MCA branch vessels do show flow. There is fetal origin of the left PCA which appears widely patent. Posterior circulation: Both vertebral arteries widely patent to the basilar. No basilar stenosis. Posterior circulation branch vessels are patent. Venous sinuses: Patent and normal. Anatomic variants: None significant. Review of the MIP images confirms the above findings IMPRESSION: Acute embolus to the left M1 segment extending over length of about 8 mm. Small amount of flow within the left MCA does opacify the more distal branch vessels. Atherosclerotic calcification at both  carotid bifurcations but without measurable stenosis. Results discussed with Dr. Thomasena Edis during interpretation at 1531 hours. Electronically Signed   By: Paulina Fusi M.D.   On: 01/23/2021 15:36   CT ANGIO NECK CODE STROKE  Result Date: 01/23/2021 CLINICAL DATA:  Acute deficit, stroke suspected. Abnormal head CT. Left hemisphere insult. EXAM: CT ANGIOGRAPHY HEAD AND NECK TECHNIQUE: Multidetector CT imaging of the head and neck was performed using the standard protocol during bolus administration of intravenous contrast. Multiplanar CT image reconstructions and MIPs were obtained to evaluate the vascular anatomy. Carotid stenosis measurements (when applicable) are obtained utilizing NASCET criteria, using the distal internal carotid diameter as the denominator. CONTRAST:  75mL OMNIPAQUE IOHEXOL 350 MG/ML SOLN COMPARISON:  Head CT earlier same day FINDINGS: CTA NECK FINDINGS Aortic arch: Normal Right carotid system: Common carotid artery widely patent to the bifurcation. Calcified plaque at the carotid bifurcation but no stenosis. Cervical ICA is widely patent. Left carotid system: Common carotid artery widely patent to the bifurcation. Calcified plaque at the carotid bifurcation and ICA bulb but no stenosis. Cervical ICA widely patent beyond that. Vertebral arteries: Both vertebral artery origins are widely patent. The left is dominant. Both vertebral arteries are patent through the cervical region to the foramen magnum. Skeleton: Scoliotic curvature which could be positional. No acute finding. Other neck: No soft tissue mass or lymphadenopathy. Upper chest: Negative Review of the MIP images confirms the above findings CTA HEAD FINDINGS Anterior circulation: Both internal carotid arteries are patent through the skull base and  siphon region. On the right, the anterior and middle cerebral vessels are patent without large vessel occlusion or stenosis. On the left, the anterior cerebral artery is widely patent.  There is an embolus within the M1 segment extending over a length of about 8 mm. This corresponds with the hyperdense vessel on the CT. This results in marked stenosis of the vessel but not complete occlusion. More distal MCA branch vessels do show flow. There is fetal origin of the left PCA which appears widely patent. Posterior circulation: Both vertebral arteries widely patent to the basilar. No basilar stenosis. Posterior circulation branch vessels are patent. Venous sinuses: Patent and normal. Anatomic variants: None significant. Review of the MIP images confirms the above findings IMPRESSION: Acute embolus to the left M1 segment extending over length of about 8 mm. Small amount of flow within the left MCA does opacify the more distal branch vessels. Atherosclerotic calcification at both carotid bifurcations but without measurable stenosis. Results discussed with Dr. Thomasena Edis during interpretation at 1531 hours. Electronically Signed   By: Paulina Fusi M.D.   On: 01/23/2021 15:36    Labs:  CBC: Recent Labs    01/23/21 1501 01/23/21 1509 01/23/21 1952 01/24/21 0509 01/25/21 0558 01/26/21 0453  WBC 10.7*  --   --  12.3* 10.7* 10.2  HGB 13.5   < > 11.6* 11.6* 10.9* 11.8*  HCT 39.6   < > 34.0* 33.6* 32.2* 34.4*  PLT 236  --   --  205 174 166   < > = values in this interval not displayed.    COAGS: Recent Labs    01/23/21 1501  INR 1.0  APTT 24    BMP: Recent Labs    01/23/21 1501 01/23/21 1509 01/23/21 1952 01/24/21 0509 01/25/21 0558 01/26/21 0453  NA 133* 135 140 136 138 135  K 4.2 4.3 3.3* 3.7 3.4* 3.9  CL 97* 98  --  108 104 103  CO2 26  --   --  22 23 21*  GLUCOSE 388* 385*  --  236* 191* 233*  BUN 13 16  --  11 13 16   CALCIUM 9.7  --   --  8.5* 8.7* 8.9  CREATININE 0.72 0.50  --  0.61 0.58 0.59  GFRNONAA >60  --   --  >60 >60 >60    LIVER FUNCTION TESTS: Recent Labs    01/23/21 1501  BILITOT 0.8  AST 25  ALT 26  ALKPHOS 69  PROT 6.9  ALBUMIN 3.9     Assessment and Plan:  3 days status post left MCA thrombectomy --No residual deficits --will defer to neuro for future plans  Electronically Signed: Sheliah Plane, PA 01/26/2021, 11:38 AM   I spent a total of 15 Minutes at the the patient's bedside AND on the patient's hospital floor or unit, greater than 50% of which was counseling/coordinating care for thrombectomy.

## 2021-01-26 NOTE — Progress Notes (Addendum)
STROKE TEAM PROGRESS NOTE   INTERVAL HISTORY Patient is seen in her room with her husband and daughter at bedside.  She was admitted yesterday with right sided weakness and aphasia and found to have a left MCA stroke.   TNK was given, and patient underwent successful thrombectomy with TICI 3 flow achieved.   Hypercoagulable workup and TEE ordered given patient's age and family history of strokes in young people. Plan to repeat CT- if stable start heparin and move out of ICU. Per patient, there is a family history of clotting disorders, afib, and young strokes. 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   Vitals:   01/26/21 0200 01/26/21 0300 01/26/21 0400 01/26/21 0800  BP:  133/80 137/79   Pulse: 80 78 79   Resp: 19 20 18    Temp:   97.9 F (36.6 C) 97.7 F (36.5 C)  TempSrc:   Oral Oral  SpO2: (!) 88% (!) 88% 90%   Weight:      Height:       CBC:  Recent Labs  Lab 01/23/21 1501 01/23/21 1509 01/24/21 0509 01/25/21 0558 01/26/21 0453  WBC 10.7*  --  12.3* 10.7* 10.2  NEUTROABS 4.9  --  10.0*  --   --   HGB 13.5   < > 11.6* 10.9* 11.8*  HCT 39.6   < > 33.6* 32.2* 34.4*  MCV 93.4  --  92.6 93.9 93.2  PLT 236  --  205 174 166   < > = values in this interval not displayed.    Basic Metabolic Panel:  Recent Labs  Lab 01/25/21 0558 01/26/21 0453  NA 138 135  K 3.4* 3.9  CL 104 103  CO2 23 21*  GLUCOSE 191* 233*  BUN 13 16  CREATININE 0.58 0.59  CALCIUM 8.7* 8.9    Lipid Panel:  Recent Labs  Lab 01/24/21 0509  CHOL 129  TRIG 114   112  HDL 33*  CHOLHDL 3.9  VLDL 23  LDLCALC 73    HgbA1c:  Recent Labs  Lab 01/24/21 0509  HGBA1C 11.1*    Urine Drug Screen:  Recent Labs  Lab 01/24/21 1712  LABOPIA NONE DETECTED  COCAINSCRNUR NONE DETECTED  LABBENZ NONE DETECTED  AMPHETMU NONE DETECTED  THCU NONE DETECTED  LABBARB NONE DETECTED     Alcohol Level No results for input(s): ETH in the last 168 hours.  IMAGING  past 24 hours CT HEAD WO CONTRAST (01/26/21)  Result Date: 01/25/2021 CLINICAL DATA:  Neuro deficit, acute, stroke suspected.  Follow-up. EXAM: CT HEAD WITHOUT CONTRAST TECHNIQUE: Contiguous axial images were obtained from the base of the skull through the vertex without intravenous contrast. COMPARISON:  MRI yesterday.  CT studies 01/23/2021. FINDINGS: Brain: No abnormality affects the brainstem or cerebellum. The right cerebral hemisphere is normal. There is swelling in the left basal ganglia and to a lesser extent in the inferior insula consistent with infarctions shown by MRI. No evidence of hemorrhagic transformation. No new insult is seen. No hydrocephalus or extra-axial collection. Vascular: No abnormal vascular finding. Skull: Negative Sinuses/Orbits: Clear/normal Other: None IMPRESSION: Low-density and swelling in the left basal ganglia and the inferior insula consistent with the areas of acute infarction shown by MRI. No evidence of hemorrhagic transformation by CT or of midline shift. Electronically Signed   By: 01/25/2021 M.D.   On: 01/25/2021 12:40    PHYSICAL EXAM General:  Alert, well-developed female in no acute  distress   NEURO:  Mental Status: AA&Ox3  Speech/Language: speech is without aphasia. Very mild hypophonia due to recent extubation.  Naming, fluency, and comprehension intact.  Cranial Nerves:  II: PERRL. Visual fields full.  III, IV, VI: EOMI. Eyelids elevate symmetrically.  V: Sensation is intact to light touch and symmetrical to face.  VII: Smile is symmetrical. Slight right nasolabial fold flattening. VIII: hearing intact to voice. IX, X:  Phonation is normal.  XII: tongue is midline without fasciculations. Motor: 5/5 strength to all muscle groups tested.  Sensation- Intact to light touch bilaterally. Extinction absent to light touch to DSS.  Coordination: FTN intact bilaterally, .No drift.  Gait- deferred   ASSESSMENT/PLAN [name] is a 50 y.o.  female with history of HTN, DM and HLD presenting with acute onset of aphasia and right sided weakness. She was found to have a left MCA stroke. TNK was given, and patient underwent successful thrombectomy with TICI 3 flow achieved.  TEE ordered for Thursday, hypercoagulable panel results are negative so far. Heparin gtt per stroke protocol.   Stroke:  left MCA scattered infarct  likely secondary b/l DVTs in the setting of PFO. Hypercoagulable work up underway CT head Hyperdense left M1 suggesting embolic disease ASPECTS 9   CTA head & neck acute embolus of left M1 segment IR M2 occlusions with TICI3 revascularization Post IR CT mild left perinsular subarachnoid hyperattenuation MRI Acute left MCA territory infarcts within the left basal ganglia, anterior limb of left internal capsule, left insula/subinsular white matter as well as superior left temporal cortex CT head No evidence of hemorrhagic transformation by CT  LE venous duplex- positive for acute DVT in left gastrocnemius veins, acute DVT in right popliteal vein, right soleal veins, and right gastrocnemius vein 2D Echo Bubble study positive for PFO, EF 65-70%, grade I diastolic dysfunction.  TEE scheduled for Thursday LDL 73 HgbA1c 11.1 Hypercoagulable workup pending VTE prophylaxis - heparin drip No antithrombotic prior to admission, now on heparin gtt per stroke protocol Therapy recommendations:  pending Disposition:  pending  Bilateral DVTs LE venous duplex- positive for acute DVT in left gastrocnemius veins, acute DVT in right popliteal vein, right soleal veins, and right gastrocnemius vein No obvious provoking factors Family history of clotting disorder - "pt grandpa had stroke at age 70, mom has afib on AC. Aunt had stroke in young age and sister died of PHTN" Hypercoagulable work-up pending On heparin IV drip per stroke protocol  PFO 2D Echo Bubble study positive for PFO TEE scheduled for Thursday Will discuss with Dr.  Excell Seltzer to see if she could be PFO closure candidate in the future  Hypertension Home meds:  lisinopril-HCTZ 1-/12.5 Stable, Cleviprex d/c'd Restart home lisiniopril and HCTZ Long-term BP goal normotensive  Hyperlipidemia Home meds:  atorvastatin 20 mg daily LDL 73, goal < 70 Increase atorvastatin to 40 mg daily  Continue statin at discharge  Diabetes type II Uncontrolled Home meds:  metformin 500 mg daily HgbA1c 11.1, goal < 7.0 CBGs Diabetes coordinator consult [facility] PCP follow-up for better DM control  Other Stroke Risk Factors Obesity, Body mass index is 30.74 kg/m., BMI >/= 30 associated with increased stroke risk, recommend weight loss, diet and exercise as appropriate  Family hx stroke (in multiple family members at young ages) Family history of young stroke in aunt and grandpa  Other Active Problems Hypokalemia- replaced 3.4-> 3.9  Hospital day # 3  Patient seen and examined by NP/APP with MD. MD to  update note as needed.   Elmer Picker, DNP, FNP-BC [facility] Pager: [phone]  ATTENDING NOTE: I reviewed above note and agree with the assessment and plan. Pt was seen and examined.   No acute event overnight.  NIH score 0.  On heparin IV, complaint headache in the afternoon but no neuro changes.  Given Tylenol.  Planned TEE Thursday.  If tolerating heparin IV tomorrow, will switch to p.o. anticoagulation Thursday.  For detailed assessment and plan, please refer to above as I have made changes wherever appropriate.   Marvel Plan, MD PhD Stroke Neurology 01/26/2021 6:39 PM    To contact Stroke Continuity provider, please refer to [email or web address]. After hours, contact General Neurology

## 2021-01-27 ENCOUNTER — Inpatient Hospital Stay (HOSPITAL_COMMUNITY): Payer: Federal, State, Local not specified - PPO

## 2021-01-27 DIAGNOSIS — S066X0A Traumatic subarachnoid hemorrhage without loss of consciousness, initial encounter: Secondary | ICD-10-CM | POA: Diagnosis not present

## 2021-01-27 DIAGNOSIS — Z8673 Personal history of transient ischemic attack (TIA), and cerebral infarction without residual deficits: Secondary | ICD-10-CM | POA: Diagnosis not present

## 2021-01-27 DIAGNOSIS — I611 Nontraumatic intracerebral hemorrhage in hemisphere, cortical: Secondary | ICD-10-CM | POA: Diagnosis not present

## 2021-01-27 DIAGNOSIS — Z95828 Presence of other vascular implants and grafts: Secondary | ICD-10-CM

## 2021-01-27 DIAGNOSIS — I6389 Other cerebral infarction: Secondary | ICD-10-CM

## 2021-01-27 LAB — HEPARIN LEVEL (UNFRACTIONATED)
Heparin Unfractionated: 0.46 IU/mL (ref 0.30–0.70)
Heparin Unfractionated: 0.42 IU/mL (ref 0.30–0.70)

## 2021-01-27 LAB — BASIC METABOLIC PANEL
Anion gap: 12 (ref 5–15)
BUN: 10 mg/dL (ref 6–20)
CO2: 23 mmol/L (ref 22–32)
Calcium: 9.3 mg/dL (ref 8.9–10.3)
Chloride: 101 mmol/L (ref 98–111)
Creatinine, Ser: 0.57 mg/dL (ref 0.44–1.00)
GFR, Estimated: >60 mL/min (ref >60)
Glucose, Bld: 153 mg/dL — ABNORMAL HIGH (ref 70–99)
Potassium: 3.3 mmol/L — ABNORMAL LOW (ref 3.5–5.1)
Sodium: 136 mmol/L (ref 135–145)

## 2021-01-27 LAB — CBC
HCT: 35.4 % — ABNORMAL LOW (ref 36.0–46.0)
Hemoglobin: 12.2 g/dL (ref 12.0–15.0)
MCH: 31.4 pg (ref 26.0–34.0)
MCHC: 34.5 g/dL (ref 30.0–36.0)
MCV: 91.2 fL (ref 80.0–100.0)
Platelets: 176 10*3/uL (ref 150–400)
RBC: 3.88 MIL/uL (ref 3.87–5.11)
RDW: 11.7 % (ref 11.5–15.5)
WBC: 10.9 10*3/uL — ABNORMAL HIGH (ref 4.0–10.5)
nRBC: 0 % (ref 0.0–0.2)

## 2021-01-27 LAB — GLUCOSE, CAPILLARY
Glucose-Capillary: 175 mg/dL — ABNORMAL HIGH (ref 70–99)
Glucose-Capillary: 187 mg/dL — ABNORMAL HIGH (ref 70–99)
Glucose-Capillary: 186 mg/dL — ABNORMAL HIGH (ref 70–99)
Glucose-Capillary: 209 mg/dL — ABNORMAL HIGH (ref 70–99)
Glucose-Capillary: 115 mg/dL — ABNORMAL HIGH (ref 70–99)

## 2021-01-27 LAB — CARDIOLIPIN ANTIBODIES, IGG, IGM, IGA
Anticardiolipin IgA: <9 APL U/mL (ref 0–11)
Anticardiolipin IgG: <9 GPL U/mL (ref 0–14)
Anticardiolipin IgM: <9 MPL U/mL (ref 0–12)

## 2021-01-27 LAB — PROTEIN C, TOTAL: Protein C, Total: 151 % — ABNORMAL HIGH (ref 60–150)

## 2021-01-27 MED ORDER — SODIUM CHLORIDE 0.9 % IV SOLN: INTRAVENOUS | Status: DC

## 2021-01-27 MED ORDER — POTASSIUM CHLORIDE CRYS ER 20 MEQ PO TBCR
40.0000 meq | EXTENDED_RELEASE_TABLET | ORAL | Status: AC
  Administered 2021-01-27 – 2021-01-28 (×2): 40 meq via ORAL
  Filled 2021-01-27 (×2): qty 2

## 2021-01-27 NOTE — Progress Notes (Signed)
Inpatient Diabetes Program Recommendations  AACE/ADA: New Consensus Statement on Inpatient Glycemic Control (2015)  Target Ranges:  Prepandial:   less than 140 mg/dL      Peak postprandial:   less than 180 mg/dL (1-2 hours)      Critically ill patients:  140 - 180 mg/dL   Lab Results  Component Value Date   GLUCAP 186 (H) 01/27/2021   HGBA1C 11.1 (H) 01/24/2021    Review of Glycemic Control  Inpatient Diabetes Program Recommendations:   Spoke with patient and husband @ bedside. Patient and husband have no further questions regarding insulin administration. Spoke with RN Eugenio Hoes regarding allowing patient to give own injections while in the hospital to prepare for home administration. Nurses, please allow patient to give own injections. If patient needs refresher of insulin pen that was taught, please use unit specific insulin pen teaching station.  Thank you, Billy Fischer. Nike Southers, RN, MSN, CDE  Diabetes Coordinator Inpatient Glycemic Control Team Team Pager [phone] (8am-5pm) 01/27/2021 1:14 PM

## 2021-01-27 NOTE — TOC Initial Note (Signed)
Transition of Care [facility]) - Initial/Assessment Note    Patient Details  Name: [name] MRN: [id number] Date of Birth: 12-17-1970  Transition of Care [facility]) CM/SW Contact:    Kermit Balo, RN Phone Number: 01/27/2021, 10:45 AM  Clinical Narrative:   PCP: Dr Durwin Nora               Patient is from home with her spouse that is retired and can provide needed supervision.  No DME at home.  Plan for TEE tomorrow.  Recommendations for outpatient therapy. Pt and spouse prefer [address] ([facility]). Orders in Epic and information on the AVS. TOC following.   Expected Discharge Plan: OP Rehab Barriers to Discharge: Continued Medical Work up   Patient Goals and CMS Choice   CMS Medicare.gov Compare Post Acute Care list provided to:: Patient Represenative (must comment) Choice offered to / list presented to : Patient, Spouse  Expected Discharge Plan and Services Expected Discharge Plan: OP Rehab   Discharge Planning Services: CM Consult   Living arrangements for the past 2 months: Single Family Home                                      Prior Living Arrangements/Services Living arrangements for the past 2 months: Single Family Home Lives with:: Spouse Patient language and need for interpreter reviewed:: Yes Do you feel safe going back to the place where you live?: Yes        Care giver support system in place?: Yes (comment)   Criminal Activity/Legal Involvement Pertinent to Current Situation/Hospitalization: No - Comment as needed  Activities of Daily Living Home Assistive Devices/Equipment: None ADL Screening (condition at time of admission) Patient's cognitive ability adequate to safely complete daily activities?: Yes Is the patient deaf or have difficulty hearing?: No Does the patient have difficulty seeing, even when wearing glasses/contacts?: No Does the patient have difficulty concentrating, remembering, or making decisions?:  No Patient able to express need for assistance with ADLs?: Yes Does the patient have difficulty dressing or bathing?: No Independently performs ADLs?: Yes (appropriate for developmental age) Does the patient have difficulty walking or climbing stairs?: No Weakness of Legs: None Weakness of Arms/Hands: None  Permission Sought/Granted                  Emotional Assessment Appearance:: Appears stated age Attitude/Demeanor/Rapport: Engaged Affect (typically observed): Accepting Orientation: : Oriented to Self, Oriented to Place, Oriented to  Time, Oriented to Situation   Psych Involvement: No (comment)  Admission diagnosis:  Stroke (cerebrum) (HCC) [I63.9] Stroke [facility]) [I63.9] Middle cerebral artery embolism, left [I66.02] Patient Active Problem List   Diagnosis Date Noted   Stroke (cerebrum) (HCC) 01/23/2021   Stroke (HCC) 01/23/2021   Middle cerebral artery embolism, left 01/23/2021   OVERWEIGHT/OBESITY 06/19/2008   VITAMIN B12 DEFICIENCY 05/05/2007   HYPERCHOLESTEROLEMIA 05/05/2007   IRRITABLE BOWEL SYNDROME, HX OF 05/05/2007   PCP:  No primary care provider on file. Pharmacy:   [id number] [place], [place] - [address] AT [facility] [address] & [facility] [address] [place] [address] Phone: [phone] Fax: [phone]     Social Determinants of Health (SDOH) Interventions    Readmission Risk Interventions No flowsheet data found.

## 2021-01-27 NOTE — Evaluation (Signed)
Occupational Therapy Evaluation Patient Details Name: [name] MRN: [id number] DOB: January 29, 1971 Today's Date: 01/27/2021   History of Present Illness Pt is 50 yo female who presents with acute onset aphasia,R facial droop,  R sided weakness, L gaze preference. Pt given TNK and had emergent thrombectomy of L M1 thrombus. After procedure pt found to have petechial hemorrhage and small L SAH. Pt also with B LE DVT's. Bubble study pos for PFO.   PMH: HTN, DM II, HLD.   Clinical Impression     [name] was indep PTA, diving and working. She lives in a 2 level home, 1 STE with family who can assist 24/7 as needed. Upon evaluation pt was limited by general fatigue, equal strength on both side and denies any vision changes. Overall she required supervision for ADLs and functional mobility without AD. She will benefit from cognitive assessment to address higher level IADLs. Recommend d/c home with op neuro OT pending pt progress acutely.    Recommendations for follow up therapy are one component of a multi-disciplinary discharge planning process, led by the attending physician.  Recommendations may be updated based on patient status, additional functional criteria and insurance authorization.   Follow Up Recommendations  Outpatient OT (neuro OT - pending pt progress acutely.)    Assistance Recommended at Discharge Intermittent Supervision/Assistance  Functional Status Assessment  Patient has had a recent decline in their functional status and demonstrates the ability to make significant improvements in function in a reasonable and predictable amount of time.  Equipment Recommendations  None recommended by OT       Precautions / Restrictions Precautions Precautions: Fall Restrictions Weight Bearing Restrictions: No      Mobility Bed Mobility Overal bed mobility: Modified Independent                  Transfers Overall transfer level: Needs assistance Equipment used:  None Transfers: Sit to/from Stand Sit to Stand: Supervision                  Balance Overall balance assessment: Modified Independent           ADL either performed or assessed with clinical judgement   ADL Overall ADL's : Needs assistance/impaired Eating/Feeding: Independent;Sitting   Grooming: Supervision/safety;Standing Grooming Details (indicate cue type and reason): at the sink Upper Body Bathing: Set up;Sitting   Lower Body Bathing: Supervison/ safety;Sit to/from stand   Upper Body Dressing : Set up;Sitting   Lower Body Dressing: Supervision/safety;Sit to/from stand   Toilet Transfer: Supervision/safety;Ambulation   Toileting- Clothing Manipulation and Hygiene: Supervision/safety;Sitting/lateral lean       Functional mobility during ADLs: Supervision/safety General ADL Comments: no AD, pt reports general fatigue. Demonstrated great ability to complete ADLs with supervision for safety only     Vision Baseline Vision/History: 0 No visual deficits Ability to See in Adequate Light: 0 Adequate Vision Assessment?: No apparent visual deficits     Perception Perception Comments: WFL       Pertinent Vitals/Pain Pain Assessment: No/denies pain Pain Intervention(s): Monitored during session     Hand Dominance Right   Extremity/Trunk Assessment Upper Extremity Assessment Upper Extremity Assessment: Generalized weakness (equal on both sides)   Lower Extremity Assessment Lower Extremity Assessment: Defer to PT evaluation   Cervical / Trunk Assessment Cervical / Trunk Assessment: Normal   Communication Communication Communication: Other (comment)   Cognition Arousal/Alertness: Awake/alert Behavior During Therapy: WFL for tasks assessed/performed;Flat affect Overall Cognitive Status: Within Functional Limits for tasks assessed  General Comments: flat and slow to respond, however WFL. 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      Prior Functioning/Environment Prior Level of Function : Independent/Modified Independent;Working/employed;Driving             OT Problem List: Decreased activity tolerance      OT Treatment/Interventions: Self-care/ADL training;Therapeutic exercise;Balance training;Patient/family education;Cognitive remediation/compensation    OT Goals(Current goals can be found in the care plan section) Acute Rehab OT Goals Patient Stated Goal: home soon OT Goal Formulation: With patient Time For Goal Achievement: 02/10/21 Potential to Achieve Goals: Good ADL Goals Pt/caregiver will Perform Home Exercise Program: Increased strength;Both right and left upper extremity;With written HEP provided Additional ADL Goal #1: pt will complete BADLs at an indep level Additional ADL Goal #2: Pt will indep complete a 3 step cognitive IDADL task  OT Frequency: Min 2X/week    AM-PAC OT "6 Clicks" Daily Activity     Outcome Measure Help from another person eating meals?: None Help from another person taking care of personal grooming?: None Help from another person toileting, which includes using toliet, bedpan, or urinal?: None Help from another person bathing (including washing, rinsing, drying)?: A Little Help from another person to put on and taking off regular upper body clothing?: None Help from another  person to put on and taking off regular lower body clothing?: A Little 6 Click Score: 22   End of Session Equipment Utilized During Treatment: Gait belt Nurse Communication: Mobility status  Activity Tolerance: Patient tolerated treatment well Patient left: in chair;with call bell/phone within reach;with family/visitor present  OT Visit Diagnosis: Unsteadiness on feet (R26.81);Other abnormalities of gait and mobility (R26.89);Muscle weakness (generalized) (M62.81)                Time: 1145-1200 OT Time Calculation (min): 15 min Charges:  OT General Charges $OT Visit: 1 Visit OT Evaluation $OT Eval Low Complexity: 1 Low  Ethelyn Cerniglia A Aswad Wandrey 01/27/2021, 12:22 PM

## 2021-01-27 NOTE — Progress Notes (Signed)
Patient administer insulin to self for lunch meal coverage under supervision of RN. Patient had no questions

## 2021-01-27 NOTE — Plan of Care (Signed)
°  Problem: Education: Goal: Knowledge of General Education information will improve Description: Including pain rating scale, medication(s)/side effects and non-pharmacologic comfort measures Outcome: Progressing   Problem: Health Behavior/Discharge Planning: Goal: Ability to manage health-related needs will improve Outcome: Progressing   Problem: Clinical Measurements: Goal: Ability to maintain clinical measurements within normal limits will improve Outcome: Progressing Goal: Will remain free from infection Outcome: Progressing Goal: Diagnostic test results will improve Outcome: Progressing Goal: Respiratory complications will improve Outcome: Progressing Goal: Cardiovascular complication will be avoided Outcome: Progressing   Problem: Activity: Goal: Risk for activity intolerance will decrease Outcome: Progressing   Problem: Nutrition: Goal: Adequate nutrition will be maintained Outcome: Progressing   Problem: Coping: Goal: Level of anxiety will decrease Outcome: Progressing   Problem: Elimination: Goal: Will not experience complications related to bowel motility Outcome: Progressing Goal: Will not experience complications related to urinary retention Outcome: Progressing   Problem: Pain Managment: Goal: General experience of comfort will improve Outcome: Progressing   Problem: Safety: Goal: Ability to remain free from injury will improve Outcome: Progressing   Problem: Skin Integrity: Goal: Risk for impaired skin integrity will decrease Outcome: Progressing   Problem: Education: Goal: Knowledge of disease or condition will improve Outcome: Progressing Goal: Knowledge of secondary prevention will improve (SELECT ALL) Outcome: Progressing Goal: Knowledge of patient specific risk factors will improve (INDIVIDUALIZE FOR PATIENT) Outcome: Progressing   Problem: Coping: Goal: Will verbalize positive feelings about self Outcome: Progressing Goal: Will  identify appropriate support needs Outcome: Progressing   Problem: Health Behavior/Discharge Planning: Goal: Ability to manage health-related needs will improve Outcome: Progressing   Problem: Self-Care: Goal: Ability to participate in self-care as condition permits will improve Outcome: Progressing Goal: Verbalization of feelings and concerns over difficulty with self-care will improve Outcome: Progressing Goal: Ability to communicate needs accurately will improve Outcome: Progressing   Problem: Nutrition: Goal: Risk of aspiration will decrease Outcome: Progressing Goal: Dietary intake will improve Outcome: Progressing   Problem: Ischemic Stroke/TIA Tissue Perfusion: Goal: Complications of ischemic stroke/TIA will be minimized Outcome: Progressing   Problem: Safety: Goal: Non-violent Restraint(s) Outcome: Progressing   Problem: Health Behavior/Discharge Planning: Goal: Ability to manage health-related needs will improve Outcome: Progressing   Problem: Clinical Measurements: Goal: Ability to maintain clinical measurements within normal limits will improve Outcome: Progressing   Problem: Coping: Goal: Level of anxiety will decrease Outcome: Progressing   Problem: Elimination: Goal: Will not experience complications related to bowel motility Outcome: Progressing   Problem: Coping: Goal: Will verbalize positive feelings about self Outcome: Progressing   Problem: Education: Goal: Knowledge of disease or condition will improve Outcome: Progressing

## 2021-01-27 NOTE — Progress Notes (Signed)
Physical Therapy Treatment Patient Details Name: [name] MRN: [id number] DOB: [birth year] Today's Date: 01/27/2021   History of Present Illness Pt is 50 yo female who presents with acute onset aphasia,R facial droop,  R sided weakness, L gaze preference. Pt given TNK and had emergent thrombectomy of L M1 thrombus. After procedure pt found to have petechial hemorrhage and small L SAH. Pt also with B LE DVT's. Bubble study pos for PFO.   PMH: HTN, DM II, HLD.    PT Comments    Pt making good progress. Does demonstrate slow gait, slow to respond at times, and decrease in higher level balance activities.  Pt did score 16/24 on DGI indicating fall risk - she was marked down on score due to decreased speed but was steady.  Continue plan of care in order to advance pt to baseline.    Recommendations for follow up therapy are one component of a multi-disciplinary discharge planning process, led by the attending physician.  Recommendations may be updated based on patient status, additional functional criteria and insurance authorization.  Follow Up Recommendations  Outpatient PT     Assistance Recommended at Discharge Intermittent Supervision/Assistance  Equipment Recommendations  None recommended by PT    Recommendations for Other Services       Precautions / Restrictions Precautions Precautions: Fall Restrictions Weight Bearing Restrictions: No     Mobility  Bed Mobility Overal bed mobility: Modified Independent Bed Mobility: Supine to Sit;Sit to Supine     Supine to sit: Modified independent (Device/Increase time) Sit to supine: Modified independent (Device/Increase time)   General bed mobility comments: no physical assist needed to get out of bed or back in but all mvmts slowed today    Transfers Overall transfer level: Needs assistance Equipment used: None Transfers: Sit to/from Stand Sit to Stand: Supervision           General transfer comment: supervision  for safety,    Ambulation/Gait Ambulation/Gait assistance: Supervision Gait Distance (Feet): 400 Feet Assistive device: None Gait Pattern/deviations: Step-through pattern Gait velocity: decreased     General Gait Details: Steady gait but slow; focused on dynamic balance and increasing speed   Stairs             Wheelchair Mobility    Modified Rankin (Stroke Patients Only) Modified Rankin (Stroke Patients Only) Pre-Morbid Rankin Score: No symptoms Modified Rankin: Slight disability     Balance Overall balance assessment: Needs assistance Sitting-balance support: No upper extremity supported Sitting balance-Leahy Scale: Normal     Standing balance support: No upper extremity supported Standing balance-Leahy Scale: Good Standing balance comment: Standing balance on balance disc with rail to mount/dismount disc.  Worked on static stand, R/L weight shifting, front/back weight shifting.  All with close guarding occasional UE support                 Standardized Balance Assessment Standardized Balance Assessment : Dynamic Gait Index   Dynamic Gait Index Level Surface: Mild Impairment Change in Gait Speed: Mild Impairment Gait with Horizontal Head Turns: Mild Impairment Gait with Vertical Head Turns: Mild Impairment Gait and Pivot Turn: Mild Impairment Step Over Obstacle: Mild Impairment Step Around Obstacles: Mild Impairment Steps: Mild Impairment Total Score: 16      Cognition Arousal/Alertness: Awake/alert Behavior During Therapy: Flat affect Overall Cognitive Status: Within Functional Limits for tasks assessed  General Comments: flat and slow to respond, however WFL. Pt states she just feels tired and fatigued        Exercises      General Comments General comments (skin integrity, edema, etc.): VSS on RA      Pertinent Vitals/Pain Pain Assessment: No/denies pain Pain Intervention(s): Monitored  during session    Home Living Family/patient expects to be discharged to:: Private residence Living Arrangements: Spouse/significant other Available Help at Discharge: Family;Available 24 hours/day Type of Home: House Home Access: Stairs to enter   Entrance Stairs-Number of Steps: 1 Alternate Level Stairs-Number of Steps: flight Home Layout: Two level;Able to live on main level with bedroom/bathroom Home Equipment: None 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    Prior Function            PT Goals (current goals can now be found in the care plan section) Acute Rehab PT Goals Patient Stated Goal: return to home and enjoying grandkids Progress towards PT goals: Progressing toward goals    Frequency    Min 4X/week      PT Plan Current plan remains appropriate    Co-evaluation              AM-PAC PT "6 Clicks" Mobility   Outcome Measure  Help needed turning from your back to your side while in a flat bed without using bedrails?: None Help needed moving from lying on your back to sitting on the side of a flat bed without using bedrails?: None Help needed moving to and from a bed to a chair (including a wheelchair)?: A Little Help needed standing up from a chair using your arms (e.g., wheelchair or bedside chair)?: A Little Help needed to walk in hospital room?: A Little Help needed climbing 3-5 steps with a railing? : A Little 6 Click Score: 20    End of Session Equipment Utilized During Treatment: Gait belt Activity Tolerance: Patient tolerated treatment well Patient left: with call bell/phone within reach;with family/visitor present;in bed Nurse Communication: Mobility status PT Visit Diagnosis: Unsteadiness on feet (R26.81)     Time: 9449-6759 PT Time Calculation (min) (ACUTE ONLY): 15 min  Charges:  $Neuromuscular Re-education: 8-22 mins                     [name], PT Acute Rehab Services Pager [phone] [name]  [phone]    [name] 01/27/2021, 2:29 PM

## 2021-01-27 NOTE — Progress Notes (Addendum)
STROKE TEAM PROGRESS NOTE   INTERVAL HISTORY Husband and daughter are at bedside.  Patient lying bed, awake alert, neurologically intact, NIH score 0.  However she continues to complain of headache around the left-sided head, worsening that yesterday, no significant improvement after Tylenol for Fioricet.  Still on heparin IV.  Will repeat CT head to rule out ICH.  TEE planned for tomorrow.  Vitals:   01/27/21 0435 01/27/21 0746 01/27/21 1146 01/27/21 1542  BP: 117/76 133/79 135/72 134/77  Pulse: 74 75 73 74  Resp: 18 17 18 18   Temp: 97.7 F (36.5 C) 98.7 F (37.1 C) 98.3 F (36.8 C) 98.1 F (36.7 C)  TempSrc: Oral Oral Oral Oral  SpO2: 96% 94% 95% 95%  Weight:      Height:       CBC:  Recent Labs  Lab 01/23/21 1501 01/23/21 1509 01/24/21 0509 01/25/21 0558 01/26/21 0453 01/27/21 0340  WBC 10.7*  --  12.3*   < > 10.2 10.9*  NEUTROABS 4.9  --  10.0*  --   --   --   HGB 13.5   < > 11.6*   < > 11.8* 12.2  HCT 39.6   < > 33.6*   < > 34.4* 35.4*  MCV 93.4  --  92.6   < > 93.2 91.2  PLT 236  --  205   < > 166 176   < > = values in this interval not displayed.   Basic Metabolic Panel:  Recent Labs  Lab 01/26/21 0453 01/27/21 0340  NA 135 136  K 3.9 3.3*  CL 103 101  CO2 21* 23  GLUCOSE 233* 153*  BUN 16 10  CREATININE 0.59 0.57  CALCIUM 8.9 9.3   Lipid Panel:  Recent Labs  Lab 01/24/21 0509  CHOL 129  TRIG 114   112  HDL 33*  CHOLHDL 3.9  VLDL 23  LDLCALC 73   HgbA1c:  Recent Labs  Lab 01/24/21 0509  HGBA1C 11.1*   Urine Drug Screen:  Recent Labs  Lab 01/24/21 1712  LABOPIA NONE DETECTED  COCAINSCRNUR NONE DETECTED  LABBENZ NONE DETECTED  AMPHETMU NONE DETECTED  THCU NONE DETECTED  LABBARB NONE DETECTED    Alcohol Level No results for input(s): ETH in the last 168 hours.  IMAGING past 24 hours CT HEAD WO CONTRAST (01/26/21)  Result Date: 01/27/2021 CLINICAL DATA:  Stroke, follow-up EXAM: CT HEAD WITHOUT CONTRAST TECHNIQUE: Contiguous axial  images were obtained from the base of the skull through the vertex without intravenous contrast. COMPARISON:  01/25/2021 FINDINGS: Brain: Intraparenchymal hemorrhage, centered in the left temporal lobe, possibly involving the lateral aspect of the left basal ganglia, measuring approximately 4.1 x 2.1 x 2.0 cm (AP x TR x CC) (series 3, image 12 and series 5, image 33). Surrounding hypodensity, most likely edema, with mild mass effect narrowing the left lateral ventricle, which does not appear significantly changed compared to previous mass effect seen on 01/25/2021. No significant midline shift. Adjacent to the intraparenchymal hemorrhage, there is additional subarachnoid hemorrhage extending more superiorly into the left frontal lobe and inferiorly into the temporal lobe. Subarachnoid hemorrhage is also seen in the left posterior frontal lobe and anterior parietal lobe (series 3, image 23). Possible trace subdural hemorrhage overlying the left frontal lobe (series 3, image 18) versus additional subarachnoid hemorrhage. Vascular: No hyperdense vessel. Skull: Normal. Negative for fracture or focal lesion. Sinuses/Orbits: No acute finding. Other: The mastoids are well aerated. IMPRESSION: 1. Intraparenchymal hemorrhage  in the left temporal lobe, concerning for hemorrhagic transformation of the previously noted infarct involving the insula. Mild surrounding edema with mass effect narrowing the left lateral ventricle, not significantly changed from the prior CT. No midline shift. 2. Subarachnoid hemorrhage surrounding the intraparenchymal hemorrhage, extending into the left frontal and temporal lobes, with additional subarachnoid hemorrhage in the left posterior frontal lobe and anterior parietal lobe. 3. Possible trace subdural hemorrhage overlying the left frontal lobe. These results were called by telephone at the time of interpretation on 01/27/2021 at 4:59 pm to provider [facility] , who verbally acknowledged these  results. Electronically Signed   By: Wiliam Ke M.D.   On: 01/27/2021 17:02    PHYSICAL EXAM General:  Alert, well-developed female in no acute distress   NEURO:  Mental Status: AA&Ox3  Speech/Language: speech is without aphasia. Very mild hypophonia due to recent extubation.  Naming, fluency, and comprehension intact.  Cranial Nerves:  II: PERRL. Visual fields full.  III, IV, VI: EOMI. Eyelids elevate symmetrically.  V: Sensation is intact to light touch and symmetrical to face.  VII: Smile is symmetrical. Slight right nasolabial fold flattening. VIII: hearing intact to voice. IX, X:  Phonation is normal.  XII: tongue is midline without fasciculations. Motor: 5/5 strength to all muscle groups tested.  Sensation- Intact to light touch bilaterally. Extinction absent to light touch to DSS.  Coordination: FTN intact bilaterally, .No drift.  Gait- deferred   ASSESSMENT/PLAN Ms. LAMONT GLASSCOCK is a 50 y.o. female with history of HTN, DM and HLD presenting with acute onset of aphasia and right sided weakness. She was found to have a left MCA stroke. TNK was given, and patient underwent successful thrombectomy with TICI 3 flow achieved.  TEE ordered for Thursday, hypercoagulable panel results are negative so far. Heparin gtt per stroke protocol.   Stroke:  left MCA scattered infarct s/p TNK, likely secondary b/l DVTs in the setting of PFO. Hypercoagulable work up underway CT head Hyperdense left M1 suggesting embolic disease ASPECTS 9   CTA head & neck acute embolus of left M1 segment IR M2 occlusions with TICI3 revascularization Post IR CT mild left perinsular subarachnoid hyperattenuation MRI Acute left MCA territory infarcts within the left basal ganglia, anterior limb of left internal capsule, left insula/subinsular white matter as well as superior left temporal cortex CT head No evidence of hemorrhagic transformation by CT  LE venous duplex- positive for acute DVT in left  gastrocnemius veins, acute DVT in right popliteal vein, right soleal veins, and right gastrocnemius vein 2D Echo Bubble study positive for PFO, EF 65-70%, grade I diastolic dysfunction.  TEE showed small PFO with b/l shunting LDL 73 HgbA1c 11.1 UDS negative Hypercoagulable workup so far negative, some are still pending VTE prophylaxis - heparin drip No antithrombotic prior to admission, now on heparin gtt per stroke protocol -> d/c due to hemorrhagic conversion Therapy recommendations:  outpt PT/OT Disposition:  pending  Hemorrhagic conversion  Headache for 2 days after starting Keppra IV CT repeated 12/28 showed hemorrhagic transformation at left frontal infarct side, no significant midline shift Neuro stable Heparin IV discontinued IVC filter placed CT repeat in am  Bilateral DVTs LE venous duplex- positive for acute DVT in left gastrocnemius veins, acute DVT in right popliteal vein, right soleal veins, and right gastrocnemius vein No obvious provoking factors Family history of clotting disorder - "pt grandpa had stroke at age 48, mom has afib on AC. Aunt had 4 DVTs and on eliquis, another  aunt had stroke in young age and pt sister died of PHTN" Hypercoagulable work-up pending On heparin IV drip per stroke protocol -> now discontinued due to hemorrhagic conversion IVC filter placed Hypercoagulable work up so far neg but some are pending Will refer to hematology as outpt  PFO 2D Echo bubble study positive for PFO TEE showed small PFO with b/l shunting Will discuss with Dr. Excell Seltzer in the future to see if she could be a PFO closure candidate in the future  Hypertension Home meds:  lisinopril-HCTZ 1-/12.5 Stable, Cleviprex d/c'd Restart home lisiniopril and HCTZ BP goal less than 140 given hemorrhagic information Long-term BP goal normotensive  Hyperlipidemia Home meds:  atorvastatin 20 mg daily LDL 73, goal < 70 Increase atorvastatin to 40 mg daily  Continue statin at  discharge  Diabetes type II Uncontrolled Home meds:  metformin 500 mg daily HgbA1c 11.1, goal < 7.0 CBGs Diabetes coordinator consult [facility] PCP follow-up for better DM control  Other Stroke Risk Factors Obesity, Body mass index is 30.74 kg/m., BMI >/= 30 associated with increased stroke risk, recommend weight loss, diet and exercise as appropriate  Family hx stroke (in multiple family members at young ages) Family history of young stroke in aunt and grandpa  Other Active Problems Hypokalemia- replaced 3.4-> 3.9->3.3->4.5  Hospital day # 4   Marvel Plan, MD PhD Stroke Neurology 01/27/2021 7:20 PM       To contact Stroke Continuity provider, please refer to [email or web address]. After hours, contact General Neurology

## 2021-01-27 NOTE — Progress Notes (Addendum)
ANTICOAGULATION CONSULT NOTE  Pharmacy Consult for Heparin Indication: DVT  No Known Allergies  Patient Measurements: Height: 5\' 6"  (167.6 cm) Weight: 86.4 kg (190 lb 7.6 oz) IBW/kg (Calculated) : 59.3  Heparin Dosing Weight: 77 kg  Vital Signs: Temp: 97.7 F (36.5 C) (12/28 0435) Temp Source: Oral (12/28 0435) BP: 117/76 (12/28 0435) Pulse Rate: 74 (12/28 0435)  Labs: Recent Labs    01/25/21 0558 01/25/21 2034 01/26/21 0453 01/26/21 1415 01/27/21 0340  HGB 10.9*  --  11.8*  --  12.2  HCT 32.2*  --  34.4*  --  35.4*  PLT 174  --  166  --  176  HEPARINUNFRC  --    < > 0.60 0.53 0.46  CREATININE 0.58  --  0.59  --  0.57   < > = values in this interval not displayed.    Estimated Creatinine Clearance: 92.1 mL/min (by C-G formula based on SCr of 0.57 mg/dL).   Assessment: 50 years of age female admitted with new CVA and received TNK and thrombectomy. Brain MRI with petechial hemorrhage and SAH. Doppler with bilateral DVTs. Pharmacy consulted to dose IV heparin, aiming for low goal with new CVA.    Heparin levels are therapeutic x2 at 0.46 and 0.42 units/mL respectively; no bleeding reported and CBC stable.   Goal of Therapy:  Heparin level 0.3 to 0.5 units/ml Monitor platelets by anticoagulation protocol: Yes  Plan:  Continue heparin infusion at 1150 units/hr Check heparin level daily while on heparin Continue to monitor H&H and platelets    Thank you for allowing pharmacy to be a part of this patients care.  43, PharmD Clinical Pharmacist

## 2021-01-27 NOTE — Progress Notes (Signed)
° ° °  [facility] has been requested to perform a transesophageal echocardiogram on [name] for stroke.  After careful review of history and examination, the risks and benefits of transesophageal echocardiogram have been explained including risks of esophageal damage, perforation (1:10,000 risk), bleeding, pharyngeal hematoma as well as other potential complications associated with conscious sedation including aspiration, arrhythmia, respiratory failure and death. Alternatives to treatment were discussed, questions were answered. Patient is willing to proceed.   Sarha Bartelt David Stall, PA-C  01/27/2021 3:26 PM

## 2021-01-28 ENCOUNTER — Inpatient Hospital Stay (HOSPITAL_COMMUNITY): Payer: Federal, State, Local not specified - PPO

## 2021-01-28 ENCOUNTER — Encounter (HOSPITAL_COMMUNITY): Payer: Self-pay | Admitting: Neurology

## 2021-01-28 ENCOUNTER — Inpatient Hospital Stay (HOSPITAL_COMMUNITY): Payer: Federal, State, Local not specified - PPO | Admitting: Anesthesiology

## 2021-01-28 ENCOUNTER — Encounter (HOSPITAL_COMMUNITY)
Admission: EM | Disposition: A | Discharge status: Home / Self Care | Payer: Self-pay | Source: Home / Self Care | Attending: Neurology

## 2021-01-28 DIAGNOSIS — Q2112 Patent foramen ovale: Secondary | ICD-10-CM

## 2021-01-28 DIAGNOSIS — I63412 Cerebral infarction due to embolism of left middle cerebral artery: Secondary | ICD-10-CM | POA: Diagnosis not present

## 2021-01-28 DIAGNOSIS — I10 Essential (primary) hypertension: Secondary | ICD-10-CM | POA: Diagnosis not present

## 2021-01-28 DIAGNOSIS — Z95828 Presence of other vascular implants and grafts: Secondary | ICD-10-CM | POA: Diagnosis not present

## 2021-01-28 DIAGNOSIS — I629 Nontraumatic intracranial hemorrhage, unspecified: Secondary | ICD-10-CM | POA: Diagnosis not present

## 2021-01-28 DIAGNOSIS — I082 Rheumatic disorders of both aortic and tricuspid valves: Secondary | ICD-10-CM | POA: Diagnosis not present

## 2021-01-28 HISTORY — PX: TEE WITHOUT CARDIOVERSION: SHX5443

## 2021-01-28 HISTORY — PX: BUBBLE STUDY: SHX6837

## 2021-01-28 HISTORY — PX: IR IVC FILTER PLMT / S&I /IMG GUID/MOD SED: IMG701

## 2021-01-28 LAB — CBC
HCT: 37 % (ref 36.0–46.0)
Hemoglobin: 12.5 g/dL (ref 12.0–15.0)
MCH: 31 pg (ref 26.0–34.0)
MCHC: 33.8 g/dL (ref 30.0–36.0)
MCV: 91.8 fL (ref 80.0–100.0)
Platelets: 179 10*3/uL (ref 150–400)
RBC: 4.03 MIL/uL (ref 3.87–5.11)
RDW: 11.6 % (ref 11.5–15.5)
WBC: 10.5 10*3/uL (ref 4.0–10.5)
nRBC: 0 % (ref 0.0–0.2)

## 2021-01-28 LAB — HEPARIN LEVEL (UNFRACTIONATED): Heparin Unfractionated: <0.1 IU/mL — ABNORMAL LOW (ref 0.30–0.70)

## 2021-01-28 LAB — GLUCOSE, CAPILLARY
Glucose-Capillary: 130 mg/dL — ABNORMAL HIGH (ref 70–99)
Glucose-Capillary: 147 mg/dL — ABNORMAL HIGH (ref 70–99)
Glucose-Capillary: 200 mg/dL — ABNORMAL HIGH (ref 70–99)
Glucose-Capillary: 128 mg/dL — ABNORMAL HIGH (ref 70–99)

## 2021-01-28 LAB — BASIC METABOLIC PANEL
Anion gap: 11 (ref 5–15)
BUN: 12 mg/dL (ref 6–20)
CO2: 21 mmol/L — ABNORMAL LOW (ref 22–32)
Calcium: 9.3 mg/dL (ref 8.9–10.3)
Chloride: 101 mmol/L (ref 98–111)
Creatinine, Ser: 0.59 mg/dL (ref 0.44–1.00)
GFR, Estimated: >60 mL/min (ref >60)
Glucose, Bld: 118 mg/dL — ABNORMAL HIGH (ref 70–99)
Potassium: 4.5 mmol/L (ref 3.5–5.1)
Sodium: 133 mmol/L — ABNORMAL LOW (ref 135–145)

## 2021-01-28 SURGERY — ECHOCARDIOGRAM, TRANSESOPHAGEAL
Anesthesia: Monitor Anesthesia Care

## 2021-01-28 MED ORDER — LACTATED RINGERS IV SOLN: INTRAVENOUS | Status: DC | PRN

## 2021-01-28 MED ORDER — MIDAZOLAM HCL 2 MG/2ML IJ SOLN
INTRAMUSCULAR | Status: AC | PRN
  Administered 2021-01-28 (×2): 1 mg via INTRAVENOUS

## 2021-01-28 MED ORDER — PROPOFOL 500 MG/50ML IV EMUL
INTRAVENOUS | Status: DC | PRN
  Administered 2021-01-28: 100 ug/kg/min via INTRAVENOUS

## 2021-01-28 MED ORDER — PROPOFOL 10 MG/ML IV BOLUS
INTRAVENOUS | Status: DC | PRN
  Administered 2021-01-28 (×2): 30 mg via INTRAVENOUS

## 2021-01-28 MED ORDER — FENTANYL CITRATE (PF) 100 MCG/2ML IJ SOLN
INTRAMUSCULAR | Status: AC | PRN
  Administered 2021-01-28 (×2): 25 ug via INTRAVENOUS

## 2021-01-28 MED ORDER — ONDANSETRON HCL 4 MG/2ML IJ SOLN
INTRAMUSCULAR | Status: DC | PRN
  Administered 2021-01-28: 4 mg via INTRAVENOUS

## 2021-01-28 MED ORDER — IOHEXOL 300 MG/ML  SOLN
100.0000 mL | Freq: Once | INTRAMUSCULAR | Status: AC | PRN
  Administered 2021-01-28: 12:00:00 50 mL via INTRAVENOUS

## 2021-01-28 MED ORDER — MIDAZOLAM HCL 2 MG/2ML IJ SOLN
INTRAMUSCULAR | Status: AC
  Filled 2021-01-28: qty 2

## 2021-01-28 MED ORDER — FENTANYL CITRATE (PF) 100 MCG/2ML IJ SOLN
INTRAMUSCULAR | Status: AC
  Filled 2021-01-28: qty 2

## 2021-01-28 MED ORDER — LIDOCAINE-EPINEPHRINE 1 %-1:100000 IJ SOLN
INTRAMUSCULAR | Status: AC
  Filled 2021-01-28: qty 1

## 2021-01-28 NOTE — Plan of Care (Signed)
°  Problem: Education: Goal: Knowledge of General Education information will improve Description: Including pain rating scale, medication(s)/side effects and non-pharmacologic comfort measures Outcome: Progressing   Problem: Health Behavior/Discharge Planning: Goal: Ability to manage health-related needs will improve Outcome: Progressing   Problem: Clinical Measurements: Goal: Ability to maintain clinical measurements within normal limits will improve Outcome: Progressing Goal: Will remain free from infection Outcome: Progressing Goal: Diagnostic test results will improve Outcome: Progressing Goal: Respiratory complications will improve Outcome: Progressing Goal: Cardiovascular complication will be avoided Outcome: Progressing   Problem: Activity: Goal: Risk for activity intolerance will decrease Outcome: Progressing   Problem: Nutrition: Goal: Adequate nutrition will be maintained Outcome: Progressing   Problem: Coping: Goal: Level of anxiety will decrease Outcome: Progressing   Problem: Elimination: Goal: Will not experience complications related to bowel motility Outcome: Progressing Goal: Will not experience complications related to urinary retention Outcome: Progressing   Problem: Pain Managment: Goal: General experience of comfort will improve Outcome: Progressing   Problem: Safety: Goal: Ability to remain free from injury will improve Outcome: Progressing   Problem: Education: Goal: Knowledge of disease or condition will improve Outcome: Progressing Goal: Knowledge of secondary prevention will improve (SELECT ALL) Outcome: Progressing Goal: Knowledge of patient specific risk factors will improve (INDIVIDUALIZE FOR PATIENT) Outcome: Progressing   Problem: Coping: Goal: Will verbalize positive feelings about self Outcome: Progressing Goal: Will identify appropriate support needs Outcome: Progressing   Problem: Health Behavior/Discharge Planning: Goal:  Ability to manage health-related needs will improve Outcome: Progressing   Problem: Self-Care: Goal: Ability to participate in self-care as condition permits will improve Outcome: Progressing Goal: Verbalization of feelings and concerns over difficulty with self-care will improve Outcome: Progressing Goal: Ability to communicate needs accurately will improve Outcome: Progressing   Problem: Nutrition: Goal: Risk of aspiration will decrease Outcome: Progressing Goal: Dietary intake will improve Outcome: Progressing   Problem: Ischemic Stroke/TIA Tissue Perfusion: Goal: Complications of ischemic stroke/TIA will be minimized Outcome: Progressing   Problem: Safety: Goal: Non-violent Restraint(s) Outcome: Progressing

## 2021-01-28 NOTE — TOC CAGE-AID Note (Signed)
Transition of Care [facility]) - CAGE-AID Screening   Patient Details  Name: [name] MRN: [id number] Date of Birth: 1970/09/04  Transition of Care [facility]) CM/SW Contact:    Marytza Grandpre C Tarpley-Carter, LCSWA Phone Number: 01/28/2021, 12:00 PM   Clinical Narrative: Pt is unable to participate in Cage Aid.  Azaleah Usman Tarpley-Carter, MSW, LCSW-A Pronouns:  She/Her/[place] [place] Transitions of Care Clinical Social Worker Direct Number:  [phone] [email or web address]  CAGE-AID Screening: Substance Abuse Screening unable to be completed due to: : Patient unable to participate             Substance Abuse Education Offered: No

## 2021-01-28 NOTE — Progress Notes (Signed)
Physical Therapy Treatment Patient Details Name: [name] MRN: [id number] DOB: Jan 12, 1971 Today's Date: 01/28/2021   History of Present Illness Pt is 50 yo female who presents with acute onset aphasia,R facial droop,  R sided weakness, L gaze preference. Pt given TNK and had emergent thrombectomy of L M1 thrombus. After procedure pt found to have petechial hemorrhage and small L SAH. Pt also with B LE DVT's. Bubble study pos for PFO.   PMH: HTN, DM II, HLD.    PT Comments    Making good progress toward goals.  Can still benefit from OPPT.  Emphasis on transfers and gait stability with stress on normalizing gait, improving DGI.    Recommendations for follow up therapy are one component of a multi-disciplinary discharge planning process, led by the attending physician.  Recommendations may be updated based on patient status, additional functional criteria and insurance authorization.  Follow Up Recommendations  Outpatient PT     Assistance Recommended at Discharge Intermittent Supervision/Assistance  Equipment Recommendations  None recommended by PT    Recommendations for Other Services       Precautions / Restrictions Precautions Precautions: Fall     Mobility  Bed Mobility Overal bed mobility: Modified Independent                  Transfers Overall transfer level: Needs assistance   Transfers: Sit to/from Stand Sit to Stand: Supervision                Ambulation/Gait Ambulation/Gait assistance: Supervision Gait Distance (Feet): 300 Feet Assistive device: None Gait Pattern/deviations: Step-through pattern Gait velocity: decreased Gait velocity interpretation: 1.31 - 2.62 ft/sec, indicative of limited community ambulator   General Gait Details: steady, able to speed up noticeably, if not significantly, much improved stability.  See DGI   Stairs Stairs: Yes Stairs assistance: Min guard Stair Management: One rail Right;Alternating  pattern;Forwards Number of Stairs: 10 General stair comments: safe with rail, guarded   Wheelchair Mobility    Modified Rankin (Stroke Patients Only) Modified Rankin (Stroke Patients Only) Pre-Morbid Rankin Score: No symptoms Modified Rankin: Slight disability     Balance Overall balance assessment: Needs assistance   Sitting balance-Leahy Scale: Normal     Standing balance support: No upper extremity supported Standing balance-Leahy Scale: Good                   Standardized Balance Assessment Standardized Balance Assessment : Dynamic Gait Index   Dynamic Gait Index Level Surface: Normal Change in Gait Speed: Mild Impairment Gait with Horizontal Head Turns: Normal Gait with Vertical Head Turns: Normal Gait and Pivot Turn: Normal Step Over Obstacle: Mild Impairment Step Around Obstacles: Normal Steps: Mild Impairment Total Score: 21      Cognition Arousal/Alertness: Awake/alert Behavior During Therapy: WFL for tasks assessed/performed Overall Cognitive Status: Within Functional Limits for tasks assessed                                          Exercises      General Comments        Pertinent Vitals/Pain Pain Assessment: Faces Faces Pain Scale: No hurt Pain Intervention(s): Monitored during session    Home Living                          Prior Function  PT Goals (current goals can now be found in the care plan section) Acute Rehab PT Goals Patient Stated Goal: return to home and enjoying grandkids PT Goal Formulation: With patient Time For Goal Achievement: 02/09/21 Potential to Achieve Goals: Good Progress towards PT goals: Progressing toward goals    Frequency    Min 4X/week      PT Plan Current plan remains appropriate    Co-evaluation              AM-PAC PT "6 Clicks" Mobility   Outcome Measure  Help needed turning from your back to your side while in a flat bed without using  bedrails?: None Help needed moving from lying on your back to sitting on the side of a flat bed without using bedrails?: None Help needed moving to and from a bed to a chair (including a wheelchair)?: None Help needed standing up from a chair using your arms (e.g., wheelchair or bedside chair)?: None Help needed to walk in hospital room?: A Little Help needed climbing 3-5 steps with a railing? : A Little 6 Click Score: 22    End of Session   Activity Tolerance: Patient tolerated treatment well Patient left: with call bell/phone within reach;with family/visitor present;in bed Nurse Communication: Mobility status PT Visit Diagnosis: Other abnormalities of gait and mobility (R26.89)     Time: 3299-2426 PT Time Calculation (min) (ACUTE ONLY): 16 min  Charges:  $Gait Training: 8-22 mins                     01/28/2021  Jacinto Halim., PT Acute Rehabilitation Services [phone]  (pager) [phone]  (office)   [name] 01/28/2021, 3:50 PM

## 2021-01-28 NOTE — Progress Notes (Signed)
Occupational Therapy Treatment Patient Details Name: [name] MRN: [id number] DOB: 01/09/1971 Today's Date: 01/28/2021   History of present illness Pt is 50 yo female who presents with acute onset aphasia,R facial droop,  R sided weakness, L gaze preference. Pt given TNK and had emergent thrombectomy of L M1 thrombus. After procedure pt found to have petechial hemorrhage and small L SAH. Pt also with B LE DVT's. Bubble study pos for PFO.   PMH: HTN, DM II, HLD.   OT comments  Pt making good progress with OT goals. At this time, pt is able to complete all functional mobility and ADL's at supervision level. She was able to complete the short blessed test with a score of 4 (2 errors), meaning she is within normal cognition. OT provided education on having someone assist pt in medication management for the first few weeks home, as well as supervision for higher level cognitive tasks. OT will continue to follow acutely.    Recommendations for follow up therapy are one component of a multi-disciplinary discharge planning process, led by the attending physician.  Recommendations may be updated based on patient status, additional functional criteria and insurance authorization.    Follow Up Recommendations  Outpatient OT    Assistance Recommended at Discharge Intermittent Supervision/Assistance  Equipment Recommendations  None recommended by OT    Recommendations for Other Services      Precautions / Restrictions Precautions Precautions: Fall Restrictions Weight Bearing Restrictions: No       Mobility Bed Mobility Overal bed mobility: Modified Independent                  Transfers Overall transfer level: Needs assistance Equipment used: None Transfers: Sit to/from Stand Sit to Stand: Supervision           General transfer comment: supervision for safety,     Balance Overall balance assessment: No apparent balance deficits (not formally  assessed) Sitting-balance support: Feet supported Sitting balance-Leahy Scale: Normal     Standing balance support: No upper extremity supported Standing balance-Leahy Scale: Good                   Standardized Balance Assessment Standardized Balance Assessment : Dynamic Gait Index   Dynamic Gait Index Level Surface: Normal Change in Gait Speed: Mild Impairment Gait with Horizontal Head Turns: Normal Gait with Vertical Head Turns: Normal Gait and Pivot Turn: Normal Step Over Obstacle: Mild Impairment Step Around Obstacles: Normal Steps: Mild Impairment Total Score: 21     ADL either performed or assessed with clinical judgement   ADL Overall ADL's : Needs assistance/impaired Eating/Feeding: Sitting;Independent Eating/Feeding Details (indicate cue type and reason): OT pulled tray up to her to eat EOB Grooming: Supervision/safety;Standing Grooming Details (indicate cue type and reason): at sink                 Toilet Transfer: Supervision/safety;Ambulation Toilet Transfer Details (indicate cue type and reason): to bathroom and back Toileting- Clothing Manipulation and Hygiene: Supervision/safety;Sitting/lateral lean Toileting - Clothing Manipulation Details (indicate cue type and reason): on toiet     Functional mobility during ADLs: Supervision/safety General ADL Comments: Pt moving well with no LoB, overall no concerns or assist needed.    Extremity/Trunk Assessment              Vision       Perception Perception Perception: Within Functional Limits   Praxis Praxis Praxis: Intact    Cognition Arousal/Alertness: Awake/alert Behavior During Therapy: WFL for tasks  assessed/performed Overall Cognitive Status: Within Functional Limits for tasks assessed                                 General Comments: Pt flat affect, most likely due to OT waking her from nap. complete short blessed test with total of 4 (2 errors).           Exercises     Shoulder Instructions       General Comments VSS on RA    Pertinent Vitals/ Pain       Pain Assessment: No/denies pain Faces Pain Scale: No hurt Pain Intervention(s): Monitored during session  Home Living                                          Prior Functioning/Environment              Frequency  Min 2X/week        Progress Toward Goals  OT Goals(current goals can now be found in the care plan section)  Progress towards OT goals: Progressing toward goals  Acute Rehab OT Goals Patient Stated Goal: To go home OT Goal Formulation: With patient Time For Goal Achievement: 02/10/21 Potential to Achieve Goals: Good ADL Goals Pt/caregiver will Perform Home Exercise Program: Increased strength;Both right and left upper extremity;With written HEP provided Additional ADL Goal #1: pt will complete BADLs at an indep level Additional ADL Goal #2: Pt will indep complete a 3 step cognitive IDADL task  Plan Discharge plan remains appropriate;Frequency remains appropriate    Co-evaluation                 AM-PAC OT "6 Clicks" Daily Activity     Outcome Measure   Help from another person eating meals?: None Help from another person taking care of personal grooming?: None Help from another person toileting, which includes using toliet, bedpan, or urinal?: None Help from another person bathing (including washing, rinsing, drying)?: A Little Help from another person to put on and taking off regular upper body clothing?: None Help from another person to put on and taking off regular lower body clothing?: A Little 6 Click Score: 22    End of Session Equipment Utilized During Treatment: Gait belt  OT Visit Diagnosis: Unsteadiness on feet (R26.81);Other abnormalities of gait and mobility (R26.89);Muscle weakness (generalized) (M62.81)   Activity Tolerance Patient tolerated treatment well   Patient Left in bed;with call bell/phone  within reach;with family/visitor present   Nurse Communication Mobility status        Time: 0454-0981 OT Time Calculation (min): 10 min  Charges: OT General Charges $OT Visit: 1 Visit OT Treatments $Therapeutic Activity: 8-22 mins  [name] H., OTR/L Acute Rehabilitation  [name] [name] [name] 01/28/2021, 4:46 PM

## 2021-01-28 NOTE — Anesthesia Postprocedure Evaluation (Signed)
Anesthesia Post Note  Patient: ZOEANN MOL  Procedure(s) Performed: TRANSESOPHAGEAL ECHOCARDIOGRAM (TEE) BUBBLE STUDY     Patient location during evaluation: PACU Anesthesia Type: MAC Level of consciousness: awake and alert, patient cooperative and oriented Pain management: pain level controlled Vital Signs Assessment: post-procedure vital signs reviewed and stable Respiratory status: spontaneous breathing, nonlabored ventilation and respiratory function stable Cardiovascular status: stable and blood pressure returned to baseline Postop Assessment: no apparent nausea or vomiting Anesthetic complications: no   No notable events documented.  Last Vitals:  Vitals:   01/28/21 0835 01/28/21 0849  BP:  106/61  Pulse:  76  Resp:  14  Temp: (!) 36.2 C   SpO2:  100%    Last Pain:  Vitals:   01/28/21 0900  TempSrc:   PainSc: 7                  Zahniya Zellars,E. Edder Bellanca

## 2021-01-28 NOTE — Progress Notes (Signed)
°  Echocardiogram Echocardiogram Transesophageal has been performed.  [name] 01/28/2021, 9:13 AM

## 2021-01-28 NOTE — Transfer of Care (Signed)
Immediate Anesthesia Transfer of Care Note  Patient: [name]  Procedure(s) Performed: TRANSESOPHAGEAL ECHOCARDIOGRAM (TEE) BUBBLE STUDY  Patient Location: PACU  Anesthesia Type:MAC  Level of Consciousness: drowsy and patient cooperative  Airway & Oxygen Therapy: Patient Spontanous Breathing and Patient connected to nasal cannula oxygen  Post-op Assessment: Report given to RN and Post -op Vital signs reviewed and stable  Post vital signs: Reviewed and stable  Last Vitals:  Vitals Value Taken Time  BP 105/70 01/28/21 0835  Temp    Pulse 84 01/28/21 0838  Resp 24 01/28/21 0838  SpO2 97 % 01/28/21 0838  Vitals shown include unvalidated device data.  Last Pain:  Vitals:   01/28/21 0740  TempSrc: Temporal  PainSc: 0-No pain      Patients Stated Pain Goal: 0 (07/68/08 8110)  Complications: No notable events documented.

## 2021-01-28 NOTE — Interval H&P Note (Signed)
History and Physical Interval Note:  01/28/2021 7:58 AM  [name]  has presented today for surgery, with the diagnosis of STROKE.  The various methods of treatment have been discussed with the patient and family. After consideration of risks, benefits and other options for treatment, the patient has consented to  Procedure(s): TRANSESOPHAGEAL ECHOCARDIOGRAM (TEE) (N/A) as a surgical intervention.  The patient's history has been reviewed, patient examined, no change in status, stable for surgery.  I have reviewed the patient's chart and labs.  Questions were answered to the patient's satisfaction.     [name]

## 2021-01-28 NOTE — Anesthesia Preprocedure Evaluation (Addendum)
Anesthesia Evaluation  Patient identified by MRN, date of birth, ID band Patient awake    Reviewed: Allergy & Precautions, NPO status , Patient's Chart, lab work & pertinent test results  History of Anesthesia Complications Negative for: history of anesthetic complications  Airway Mallampati: II  TM Distance: >3 FB Neck ROM: Full    Dental  (+) Missing, Dental Advisory Given   Pulmonary neg pulmonary ROS,    breath sounds clear to auscultation       Cardiovascular hypertension, Pt. on medications  Rhythm:Regular Rate:Normal  01/24/2021 EF 65-70%, normal LVF, grade 1 DD, no significant valvular abnormalities   Neuro/Psych CVA (acute speech difficulty has resolved), No Residual Symptoms    GI/Hepatic negative GI ROS, Neg liver ROS,   Endo/Other  diabetes (glu 130), Oral Hypoglycemic Agents  Renal/GU      Musculoskeletal   Abdominal   Peds  Hematology negative hematology ROS (+)   Anesthesia Other Findings   Reproductive/Obstetrics                            Anesthesia Physical Anesthesia Plan  ASA: 3  Anesthesia Plan: MAC   Post-op Pain Management: Minimal or no pain anticipated   Induction:   PONV Risk Score and Plan: 2 and Treatment may vary due to age or medical condition and Ondansetron  Airway Management Planned: Natural Airway and Nasal Cannula  Additional Equipment: None  Intra-op Plan:   Post-operative Plan:   Informed Consent: I have reviewed the patients History and Physical, chart, labs and discussed the procedure including the risks, benefits and alternatives for the proposed anesthesia with the patient or authorized representative who has indicated his/her understanding and acceptance.     Dental advisory given  Plan Discussed with: CRNA and Surgeon  Anesthesia Plan Comments:        Anesthesia Quick Evaluation

## 2021-01-28 NOTE — Sedation Documentation (Signed)
Rt venous sheath removed at 1130 hemostatics achieved manual pressure for 5 minutes groin level zero gauze, Tegaderm bandage intact.

## 2021-01-28 NOTE — CV Procedure (Signed)
TRANSESOPHAGEAL ECHOCARDIOGRAM (TEE) NOTE  INDICATIONS: Cryptogenic stroke, PFO  PROCEDURE:   Informed consent was obtained prior to the procedure. The risks, benefits and alternatives for the procedure were discussed and the patient comprehended these risks.  Risks include, but are not limited to, cough, sore throat, vomiting, nausea, somnolence, esophageal and stomach trauma or perforation, bleeding, low blood pressure, aspiration, pneumonia, infection, trauma to the teeth and death.    After a procedural time-out, the patient was given propofol per anesthesia for moderate sedation.  The patient's heart rate, blood pressure, and oxygen saturation are monitored continuously during the procedure.The oropharynx was anesthetized with 2 sprays of topical cetacaine.  The transesophageal probe was inserted in the esophagus and stomach without difficulty and multiple views were obtained.  The patient was kept under observation until the patient left the procedure room. I was present face-to-face 100% of this time. The patient left the procedure room in stable condition.   Agitated microbubble saline contrast was administered.  COMPLICATIONS:    There were no immediate complications.  Findings:  LEFT VENTRICLE: The left ventricular wall thickness is normal.  The left ventricular cavity is normal in size. Wall motion is normal.  LVEF is 55-60%.  RIGHT VENTRICLE:  The right ventricle is normal in structure and function without any thrombus or masses.    LEFT ATRIUM:  The left atrium is normal in size without any thrombus or masses.  There is not spontaneous echo contrast ("smoke") in the left atrium consistent with a low flow state.  LEFT ATRIAL APPENDAGE:  The left atrial appendage is free of any thrombus or masses. The appendage has single lobes. Pulse doppler indicates moderate flow in the appendage.  ATRIAL SEPTUM:  The atrial septum demonstrates a small PFO with bidirectional flow and  spontaneous shunting by color doppler and saline microbubble contrast. The PFO roughly measures 0.1 cm2 via 3D imaging.  RIGHT ATRIUM:  The right atrium is normal in size and function without any thrombus or masses.  MITRAL VALVE:  The mitral valve is normal in structure and function with  no  regurgitation.  There were no vegetations or stenosis.  AORTIC VALVE:  The aortic valve is trileaflet, normal in structure and function with  trivial  regurgitation.  There were no vegetations or stenosis  TRICUSPID VALVE:  The tricuspid valve is normal in structure and function with  trivial  regurgitation.  There were no vegetations or stenosis   PULMONIC VALVE:  The pulmonic valve is normal in structure and function with  no  regurgitation.  There were no vegetations or stenosis.   AORTIC ARCH, ASCENDING AND DESCENDING AORTA:  There was no Ron Parker et. Al, 1992) atherosclerosis of the ascending aorta, aortic arch, or proximal descending aorta.  12. PULMONARY VEINS: Anomalous pulmonary venous return was not noted.  13. PERICARDIUM: The pericardium appeared normal and non-thickened.  There is no pericardial effusion.  IMPRESSION:   Small bidirectional PFO with spontaneous shunting Trivial AI LVEF 55-60%, normal wall motion  RECOMMENDATIONS:    Given stroke, young age and small PFO with bidirectional shunting, would consider PFO closure at some point once she has recovered from her [place].  Time Spent Directly with the Patient:  45 minutes   Pixie Casino, MD, [facility], [place] Director of the [facility] Lip[facility] &  Card[facility]ovascular [facility] Diplomate of the American Board of Clinical Lipidology Attending Cardiologist  Direct Dial: [phone]  Fax: [phone]  Website:  [email or web address] Nicely Nakul Avino 01/28/2021, 8:51 AM

## 2021-01-28 NOTE — Procedures (Addendum)
Vascular and Interventional Radiology Procedure Note  Patient: [name] DOB: 08-18-1970 Medical Record Number: [id number] Note Date/Time: 01/28/21 12:29 PM   Performing Physician: Roanna Banning, MD Assistant(s): None  Diagnosis: ICH  Procedure:  VENOGRAPHY and INFERIOR VENA CAVA FILTER PLACEMENT  Anesthesia: Conscious Sedation Complications: None Estimated Blood Loss: Minimal Specimens: None  Findings:  - access via the Right femoral vein. - Successful placement of Bard Denali infrarenal IVC filter  Plan: IVC filters can cause complications when left in place for extended periods of time. If medically appropriate, recommend discontinuing filter prior to discharge.  Please re-evaluate the patient for filter discontinuation when they are seen in follow up, and refer patient to Interventional Radiology for removal.  Final report to follow once all images are reviewed and compared with previous studies.  See detailed dictation with images in PACS. The patient tolerated the procedure well without incident or complication and was returned to Floor Bed in stable condition.    Roanna Banning, MD [facility] Radiology   Pager. [phone] Clinic. [phone]

## 2021-01-28 NOTE — H&P (Signed)
Chief Complaint: Patient was seen in consultation today for stroke, lower extremity thrombus  Referring Physician(s): Dr. Merrily Pew  Supervising Physician: Roanna Banning  Patient Status: [facility] - In-pt  History of Present Illness: JODA BRAATZ is a 50 y.o. female with PMHx of HTN, uncontrolled DM2, and HLD who presented as CODE STROKE 01/23/21. She underwent M1 thrombectomy with Dr. Corliss Skains with successful removal of her clot. Brain MRI revealed petechial hemorrhage and small SAH left cerebral hemisphere, and bilateral LE DVT. IR now consulted for IVC filter placement as patient cannot proceed with anticoagulation.   Case reviewed by Dr. Milford Cage who has approved patient for the procedure.  Given that the patient has discontinue aspirin 12/26, and heparin gtt as of yesterday would like to proceed today if possible.   She has been NPO.   History reviewed. No pertinent past medical history.  Past Surgical History:  Procedure Laterality Date   IR CT HEAD LTD  01/23/2021   IR PERCUTANEOUS ART THROMBECTOMY/INFUSION INTRACRANIAL INC DIAG ANGIO  01/23/2021   RADIOLOGY WITH ANESTHESIA N/A 01/23/2021   Procedure: IR WITH ANESTHESIA;  Surgeon: Radiologist, Medication, MD;  Location: [facility] OR;  Service: Radiology;  Laterality: N/A;    Allergies: Patient has no known allergies.  Medications: Prior to Admission medications   Medication Sig Start Date End Date Taking? Authorizing Provider  atorvastatin (LIPITOR) 20 MG tablet Take 20 mg by mouth every evening. 12/31/20  Yes [provider]  gabapentin (NEURONTIN) 100 MG capsule Take 100 mg by mouth every evening. 01/21/21  Yes [provider]  lisinopril-hydrochlorothiazide (ZESTORETIC) 10-12.5 MG tablet Take 1 tablet by mouth every evening. 01/19/21  Yes [provider]  metFORMIN (GLUCOPHAGE-XR) 500 MG 24 hr tablet Take 500 mg by mouth daily with supper. 01/21/21  Yes [provider]  triamcinolone cream  (KENALOG) 0.1 % Apply 1 application topically 2 (two) times daily as needed. 12/31/20  Yes [provider]  valACYclovir (VALTREX) 1000 MG tablet Take 2,000 mg by mouth See admin instructions. Take 2 tablets (2000 mg) by mouth twice daily for one day as needed for outbreaks 12/31/20  Yes [provider]     History reviewed. No pertinent family history.  Social History   Socioeconomic History   Marital status: Married    Spouse name: Not on file   Number of children: Not on file   Years of education: Not on file   Highest education level: Not on file  Occupational History   Not on file  Tobacco Use   Smoking status: Not on file   Smokeless tobacco: Not on file  Substance and Sexual Activity   Alcohol use: Not on file   Drug use: Not on file   Sexual activity: Not on file  Other Topics Concern   Not on file  Social History Narrative   Not on file   Social Determinants of Health   Financial Resource Strain: Not on file  Food Insecurity: Not on file  Transportation Needs: Not on file  Physical Activity: Not on file  Stress: Not on file  Social Connections: Not on file     Review of Systems: A 12 point ROS discussed and pertinent positives are indicated in the HPI above.  All other systems are negative.  Review of Systems  Unable to perform ROS: Acuity of condition   Vital Signs: BP 107/66    Pulse 74    Temp (!) 97.2 F (36.2 C)    Resp 16  Ht 5\' 6"  (1.676 m)    Wt 190 lb 7.6 oz (86.4 kg)    SpO2 95%    BMI 30.74 kg/m   Physical Exam Vitals and nursing note reviewed.  Constitutional:      General: She is not in acute distress.    Comments: Recovering after TEE this AM  HENT:     Mouth/Throat:     Mouth: Mucous membranes are moist.     Pharynx: Oropharynx is clear.  Cardiovascular:     Rate and Rhythm: Normal rate and regular rhythm.  Pulmonary:     Effort: Pulmonary effort is normal. No respiratory distress.     Breath sounds: Normal breath  sounds.  Skin:    General: Skin is warm and dry.     Comments: Groin soft.  Dressing remains in place. No evidence of hematoma or pseudoaneurysm.  Neurological:     General: No focal deficit present.     Mental Status: She is alert and oriented to person, place, and time. Mental status is at baseline.  Psychiatric:        Mood and Affect: Mood normal.        Behavior: Behavior normal.        Thought Content: Thought content normal.        Judgment: Judgment normal.     MD Evaluation Airway: WNL Heart: WNL Abdomen: WNL Chest/ Lungs: WNL ASA  Classification: 3 Mallampati/Airway Score: Two   Imaging: CT HEAD WO CONTRAST ( )  Result Date: 01/27/2021 CLINICAL DATA:  Stroke, follow-up EXAM: CT HEAD WITHOUT CONTRAST TECHNIQUE: Contiguous axial images were obtained from the base of the skull through the vertex without intravenous contrast. COMPARISON:  01/25/2021 FINDINGS: Brain: Intraparenchymal hemorrhage, centered in the left temporal lobe, possibly involving the lateral aspect of the left basal ganglia, measuring approximately 4.1 x 2.1 x 2.0 cm (AP x TR x CC) (series 3, image 12 and series 5, image 33). Surrounding hypodensity, most likely edema, with mild mass effect narrowing the left lateral ventricle, which does not appear significantly changed compared to previous mass effect seen on 01/25/2021. No significant midline shift. Adjacent to the intraparenchymal hemorrhage, there is additional subarachnoid hemorrhage extending more superiorly into the left frontal lobe and inferiorly into the temporal lobe. Subarachnoid hemorrhage is also seen in the left posterior frontal lobe and anterior parietal lobe (series 3, image 23). Possible trace subdural hemorrhage overlying the left frontal lobe (series 3, image 18) versus additional subarachnoid hemorrhage. Vascular: No hyperdense vessel. Skull: Normal. Negative for fracture or focal lesion. Sinuses/Orbits: No acute finding. Other: The  mastoids are well aerated. IMPRESSION: 1. Intraparenchymal hemorrhage in the left temporal lobe, concerning for hemorrhagic transformation of the previously noted infarct involving the insula. Mild surrounding edema with mass effect narrowing the left lateral ventricle, not significantly changed from the prior CT. No midline shift. 2. Subarachnoid hemorrhage surrounding the intraparenchymal hemorrhage, extending into the left frontal and temporal lobes, with additional subarachnoid hemorrhage in the left posterior frontal lobe and anterior parietal lobe. 3. Possible trace subdural hemorrhage overlying the left frontal lobe. These results were called by telephone at the time of interpretation on 01/27/2021 at 4:59 pm to provider [facility] , who verbally acknowledged these results. Electronically Signed   By: Wiliam Ke M.D.   On: 01/27/2021 17:02   CT HEAD WO CONTRAST ( )  Result Date: 01/25/2021 CLINICAL DATA:  Neuro deficit, acute, stroke suspected.  Follow-up. EXAM: CT HEAD WITHOUT CONTRAST TECHNIQUE: Contiguous axial images  were obtained from the base of the skull through the vertex without intravenous contrast. COMPARISON:  MRI yesterday.  CT studies 01/23/2021. FINDINGS: Brain: No abnormality affects the brainstem or cerebellum. The right cerebral hemisphere is normal. There is swelling in the left basal ganglia and to a lesser extent in the inferior insula consistent with infarctions shown by MRI. No evidence of hemorrhagic transformation. No new insult is seen. No hydrocephalus or extra-axial collection. Vascular: No abnormal vascular finding. Skull: Negative Sinuses/Orbits: Clear/normal Other: None IMPRESSION: Low-density and swelling in the left basal ganglia and the inferior insula consistent with the areas of acute infarction shown by MRI. No evidence of hemorrhagic transformation by CT or of midline shift. Electronically Signed   By: Paulina Fusi M.D.   On: 01/25/2021 12:40   MR BRAIN WO  CONTRAST  Addendum Date: 01/24/2021   ADDENDUM REPORT: 01/24/2021 17:38 ADDENDUM: Findings in impressions 2 and 4 called to Dr. Viviann Spare by telephone at approximately 5:30 p.m. on 01/24/2021. Electronically Signed   By: Jackey Loge D.O.   On: 01/24/2021 17:38   Result Date: 01/24/2021 CLINICAL DATA:  Provided history: Stroke, follow-up. EXAM: MRI HEAD WITHOUT CONTRAST TECHNIQUE: Multiplanar, multiecho pulse sequences of the brain and surrounding structures were obtained without intravenous contrast. COMPARISON:  CT angiogram head/neck and non-contrast head CT 01/23/2021. FINDINGS: Brain: Cerebral volume is normal. Acute infarcts involving much of the left caudate body and left putamen. Small acute infarcts are also present within the anterior limb of internal capsule, left insula/subinsular white matter as well as cortex of the superior left temporal lobe. SWI signal loss at site of the acute infarcts within the left basal ganglia, likely reflecting petechial hemorrhage (Heidelberg 1b, HI 2- confluent petechiae, no mass effect). Mild petechial hemorrhage is also present along the left sylvian fissure. 2-3 mm acute cortical infarct within the left occipital lobe (series 2, image 30). Small-to-moderate volume acute subarachnoid hemorrhage along the left cerebral hemisphere, not significantly increased in volume as compared to the postprocedural head CT yesterday, but with interval redistribution. No evidence of an intracranial mass. No midline shift. Vascular: Maintained flow voids within the proximal large arterial vessels. Skull and upper cervical spine: No focal suspicious marrow lesion. Visualized orbits show no acute finding. Bilateral lens replacements. Trace mucosal thickening within the bilateral ethmoid, sphenoid and right maxillary sinuses. Sinuses/Orbits: Maintained flow voids within the proximal large arterial vessels. IMPRESSION: Acute left MCA territory infarcts within the left basal ganglia,  anterior limb of left internal capsule, left insula/subinsular white matter as well as superior left temporal cortex. Susceptibility-weighted signal loss at site of the acute infarcts within the left basal ganglia, likely reflecting petechial hemorrhage (Heidelberg 1b, HI2- confluent petechiae, no mass effect). Consider a non-contrast head CT for CT characterization of this hemorrhage, and for a more direct comparison with the postprocedural head CT performed yesterday. Mild petechial hemorrhage along the left sylvian fissure. 2-3 mm acute cortical infarct within the left occipital lobe (left PCA territory). Small-to-moderate volume acute subarachnoid hemorrhage along the left cerebral hemisphere, not significantly changed in volume, but with interval redistribution. Electronically Signed: By: Jackey Loge D.O. On: 01/24/2021 17:21   IR CT Head Ltd  Result Date: 01/28/2021 INDICATION: New onset global aphasia and right-sided hemiplegia. 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 EXAM: 1. EMERGENT LARGE VESSEL OCCLUSION THROMBOLYSIS (anterior CIRCULATION) COMPARISON:  CT angiogram of the head and neck of January 23, 2021. MEDICATIONS: Ancef 2  g IV antibiotic was administered within 1 hour of the procedure. ANESTHESIA/SEDATION: General anesthesia. CONTRAST:  Omnipaque 300 approximately 65 mL. FLUOROSCOPY TIME:  Fluoroscopy Time: 15 minutes 42 seconds (792.8 mGy). COMPLICATIONS: None immediate. TECHNIQUE: Following a full explanation of the procedure along with the potential associated complications, an informed witnessed consent was obtained. The risks of intracranial hemorrhage of 10%, worsening neurological deficit, ventilator dependency, death and inability to revascularize were all reviewed in detail with the patient's spouse. The patient was then put under general anesthesia by the Department of Anesthesiology at [facility]. The  right groin was prepped and draped in the usual sterile fashion. Thereafter using modified Seldinger technique, transfemoral access into the right common femoral artery was obtained without difficulty. Over an 0.035 inch guidewire an 8 French Pinnacle sheath was inserted. Through this, and also over an 0.035 inch guidewire a 5 [place] JB 1 catheter was advanced to the aortic arch region and selectively positioned in the left common carotid artery. FINDINGS: The left common carotid arteriogram demonstrates the left external carotid artery and its major branches to be widely patent. The left internal carotid artery at the bulb to the cranial skull base is widely patent. The petrous, the cavernous and the supraclinoid segments are widely patent. A left posterior communicating artery is seen opacifying the left posterior cerebral artery distribution. Left anterior cerebral artery opacifies into the capillary and venous phases. The left middle cerebral artery demonstrates an early bifurcation with wide patency of the superior division. The inferior division demonstrates a proximal complete occlusion with a large area of hypoperfusion of the left parietal cortical and subcortical area. PROCEDURE: The diagnostic JB 1 catheter in the left common carotid artery was then exchanged over an 0.035 inch 300 cm Rosen exchange guidewire for an 087 95 cm balloon guide catheter which had been prepped with 50% contrast and 50% heparinized saline infusion. The guide catheter was advanced to the distal 1/3 of the left internal carotid artery. The guidewire was removed. Good aspiration obtained from the hub of the balloon guide catheter. A gentle control arteriogram performed through the balloon guide catheter demonstrated no change in the extracranial or intracranial circulations. Over an 0.014 inch standard Synchro micro guidewire with a moderate J configuration, an 021 160 microcatheter inside of an 055 132 cm Zoom aspiration catheter  combination was advanced to the supraclinoid left ICA. Using a torque device, the micro guidewire was then gently advanced without any difficulty into the M3 region of the inferior division of the left middle cerebral artery followed by the microcatheter. The guidewire was removed. Good aspiration obtained from the hub of the microcatheter. Gentle contrast injection through the microcatheter demonstrated safe positioning of the tip of the microcatheter, which was connected to continuous heparinized saline infusion. At this time the Zoom aspiration catheter was advanced and positioned just proximal to the occluded left MCA inferior division. A 3 mm x 40 mm Solitaire X retrieval device was then advanced to the distal end of the microcatheter. The O ring on the delivery microcatheter was loosened. With slight forward gentle traction with the right hand on the delivery micro guidewire with left hand the retrieval device was then advanced. With constant aspiration applied the hub of the balloon guide catheter proximally with proximal flow arrest, and with a Penumbra aspiration device through the Zoom catheter for approximately 3 minutes, the combination of the retrieval device, the microcatheter and the Zoom aspiration catheter were retrieved and removed. Long segment of clot  was seen entangled in the retrieval device. Following reversal of flow arrest, a control arteriogram performed through the balloon guide catheter in the left internal carotid artery now demonstrated complete revascularization of the inferior division. A TICI 3 MCA revascularization was achieved. The left posterior communicating artery and the left anterior cerebral artery remained widely patent. Moderate spasm in the proximal left inferior division responded to 3 aliquots of 25 mcg of nitroglycerin. A final control arteriogram performed through the balloon guide in the left common carotid artery continued to demonstrate wide patency of the left  internal carotid extra cranially and intracranially. No evidence of intraluminal filling defects or of occlusion was noted. Balloon guide was removed. The 8 French Pinnacle sheath was removed with hemostasis in the right groin obtained with a 6 French Angio-Seal closure device, and manual compression with a quick clot for 15 minutes. Distal pulses remained palpable in all feet unchanged. A CT of the brain obtained demonstrated no evidence of mass effect or midline shift. There was mild hyperattenuation in the subarachnoid space in the left perisylvian area. Trial of extubation was unsuccessful as the patient was unresponsive. Patient was, therefore, left intubated and transferred to the neuro ICU for post revascularization care. IMPRESSION: Status post endovascular complete revascularization of occluded inferior division of the left middle cerebral artery with 1 pass with a 3 mm x 40 mm Solitaire X retrieval device, and contact aspiration achieving a TICI 3 revascularization. PLAN: Follow-up as per referring MD. Electronically Signed   By: Julieanne Cotton M.D.   On: 01/28/2021 08:42   DG Abd Portable 1V  Result Date: 01/28/2021 CLINICAL DATA:  Evaluate for IVC filter. EXAM: PORTABLE ABDOMEN - 1 VIEW COMPARISON:  Chest XR, 04/04/2012 FINDINGS: The bowel gas pattern is normal. No radio-opaque calculi or other significant radiographic abnormality are seen. IMPRESSION: 1. Nonobstructed bowel gas pattern. 2. No pre-existing IVC filter. Electronically Signed   By: Roanna Banning M.D.   On: 01/28/2021 09:37   ECHO TEE  Result Date: 01/28/2021    TRANSESOPHOGEAL ECHO REPORT   Patient Name:   [name] Date of Exam: 01/28/2021 Medical Rec #:  [id number]       Height:       66.0 in Accession #:    [id number]      Weight:       190.5 lb Date of Birth:  05-14-1970      BSA:          1.959 m Patient Age:    50 years        BP:           101/66 mmHg Patient Gender: F               HR:           88 bpm. Exam  Location:  Inpatient Procedure: Cardiac Doppler, Transesophageal Echo, 3D Echo, Color Doppler and            Saline Contrast Bubble Study Indications:     PFO  History:         Patient has prior history of Echocardiogram examinations, most                  recent 01/24/2021.  Sonographer:     Eulah Pont RDCS Referring Phys:  [id number] Lennox Solders [place] Diagnosing Phys: Zoila Shutter MD PROCEDURE: After discussion of the risks and benefits of a TEE, an informed consent was obtained from the patient. The  transesophogeal probe was passed without difficulty through the esophogus of the patient. Local oropharyngeal anesthetic was provided with Cetacaine. Sedation performed by different physician. The patient was monitored while under deep sedation. Anesthestetic sedation was provided intravenously by Anesthesiology: 278.  of Propofol. The patient's vital signs; including heart rate, blood pressure, and oxygen saturation; remained stable throughout the procedure. The patient developed no complications during the procedure. IMPRESSIONS  1. Left ventricular ejection fraction, by estimation, is 55 to 60%. The left ventricle has normal function. The left ventricle has no regional wall motion abnormalities.  2. Right ventricular systolic function is normal. The right ventricular size is normal.  3. No left atrial/left atrial appendage thrombus was detected.  4. The mitral valve is normal in structure. No evidence of mitral valve regurgitation.  5. The aortic valve is tricuspid. Aortic valve regurgitation is trivial.  6. Evidence of atrial level shunting detected by color flow Doppler. Agitated saline contrast bubble study was positive with shunting observed within 3-6 cardiac cycles suggestive of interatrial shunt. There is a small patent foramen ovale with bidirectional shunting across atrial septum. Conclusion(s)/Recommendation(s): Findings are concerning for an interatrial shunt as detailed above. Consider  outpatient PFO closure after recovery from intracerebral hemorrhage. FINDINGS  Left Ventricle: Left ventricular ejection fraction, by estimation, is 55 to 60%. The left ventricle has normal function. The left ventricle has no regional wall motion abnormalities. The left ventricular internal cavity size was normal in size. There is  no left ventricular hypertrophy. Right Ventricle: The right ventricular size is normal. No increase in right ventricular wall thickness. Right ventricular systolic function is normal. Left Atrium: Left atrial size was normal in size. No left atrial/left atrial appendage thrombus was detected. Right Atrium: Right atrial size was normal in size. Pericardium: There is no evidence of pericardial effusion. Mitral Valve: The mitral valve is normal in structure. No evidence of mitral valve regurgitation. Tricuspid Valve: The tricuspid valve is grossly normal. Tricuspid valve regurgitation is trivial. Aortic Valve: The aortic valve is tricuspid. Aortic valve regurgitation is trivial. Pulmonic Valve: The pulmonic valve was normal in structure. Pulmonic valve regurgitation is not visualized. Aorta: The aortic root and ascending aorta are structurally normal, with no evidence of dilitation. Venous: The left upper pulmonary vein and left lower pulmonary vein are normal. IAS/Shunts: The interatrial septum is aneurysmal. Evidence of atrial level shunting detected by color flow Doppler. Agitated saline contrast was given intravenously to evaluate for intracardiac shunting. Agitated saline contrast bubble study was positive  with shunting observed within 3-6 cardiac cycles suggestive of interatrial shunt. A small patent foramen ovale is detected with bidirectional shunting across atrial septum. Zoila Shutter MD Electronically signed by Zoila Shutter MD Signature Date/Time: 01/28/2021/9:35:33 AM    Final    ECHOCARDIOGRAM COMPLETE BUBBLE STUDY  Result Date: 01/24/2021    ECHOCARDIOGRAM REPORT    Patient Name:   REDONNA WILBERT Date of Exam: 01/24/2021 Medical Rec #:  [id number]       Height:       66.0 in Accession #:    [id number]      Weight:       190.5 lb Date of Birth:  [birth year]      BSA:          1.959 m Patient Age:    50 years        BP:           140/73 mmHg Patient Gender: F  HR:           101 bpm. Exam Location:  Inpatient Procedure: 2D Echo, Cardiac Doppler, Color Doppler and Saline Contrast Bubble            Study Indications:    Stroke  History:        Patient has no prior history of Echocardiogram examinations.                 Risk Factors:Hypertension, Diabetes and Dyslipidemia.  Sonographer:    Ross Ludwig RDCS (AE) Referring Phys: [id number] Be[address] Arbour [facility] IMPRESSIONS  1. Left ventricular ejection fraction, by estimation, is 65 to 70%. The left ventricle has normal function. The left ventricle has no regional wall motion abnormalities. Left ventricular diastolic parameters are consistent with Grade I diastolic dysfunction (impaired relaxation).  2. Right ventricular systolic function is normal. The right ventricular size is normal. There is mildly elevated pulmonary artery systolic pressure.  3. Left atrial size was mildly dilated.  4. The mitral valve is normal in structure. No evidence of mitral valve regurgitation. No evidence of mitral stenosis.  5. The aortic valve is normal in structure. There is mild calcification of the aortic valve. Aortic valve regurgitation is not visualized. No aortic stenosis is present.  6. The inferior vena cava is normal in size with greater than 50% respiratory variability, suggesting right atrial pressure of 3 mmHg.  7. Agitated saline contrast bubble study was positive with shunting observed within 3-6 cardiac cycles suggestive of interatrial shunt. The bubble study was mildly positive on the first few beats but markedly positive on the 5th beat. Suspect intracardiac shunting but may also have extracardai source. Suggest TEE to further  evaluate, if clinically indicated. FINDINGS  Left Ventricle: Left ventricular ejection fraction, by estimation, is 65 to 70%. The left ventricle has normal function. The left ventricle has no regional wall motion abnormalities. The left ventricular internal cavity size was normal in size. There is  no left ventricular hypertrophy. Left ventricular diastolic parameters are consistent with Grade I diastolic dysfunction (impaired relaxation). Right Ventricle: The right ventricular size is normal. No increase in right ventricular wall thickness. Right ventricular systolic function is normal. There is mildly elevated pulmonary artery systolic pressure. The tricuspid regurgitant velocity is 3.04  m/s, and with an assumed right atrial pressure of 8 mmHg, the estimated right ventricular systolic pressure is 45.0 mmHg. Left Atrium: Left atrial size was mildly dilated. Right Atrium: Right atrial size was normal in size. Pericardium: There is no evidence of pericardial effusion. Mitral Valve: The mitral valve is normal in structure. No evidence of mitral valve regurgitation. No evidence of mitral valve stenosis. MV peak gradient, 5.3 mmHg. The mean mitral valve gradient is 3.0 mmHg. Tricuspid Valve: The tricuspid valve is normal in structure. Tricuspid valve regurgitation is mild . No evidence of tricuspid stenosis. Aortic Valve: The aortic valve is normal in structure. There is mild calcification of the aortic valve. Aortic valve regurgitation is not visualized. No aortic stenosis is present. Aortic valve mean gradient measures 5.0 mmHg. Aortic valve peak gradient measures 10.0 mmHg. Aortic valve area, by VTI measures 2.43 cm. Pulmonic Valve: The pulmonic valve was normal in structure. Pulmonic valve regurgitation is not visualized. No evidence of pulmonic stenosis. Aorta: The aortic root is normal in size and structure. Venous: The inferior vena cava is normal in size with greater than 50% respiratory variability,  suggesting right atrial pressure of 3 mmHg. IAS/Shunts: No atrial level shunt detected by  color flow Doppler. Agitated saline contrast was given intravenously to evaluate for intracardiac shunting. Agitated saline contrast bubble study was positive with shunting observed within 3-6 cardiac cycles suggestive of interatrial shunt.  LEFT VENTRICLE PLAX 2D LVIDd:         4.10 cm   Diastology LVIDs:         2.40 cm   LV e' medial:    15.60 cm/s LV PW:         0.80 cm   LV E/e' medial:  5.3 LV IVS:        0.90 cm   LV e' lateral:   18.90 cm/s LVOT diam:     1.90 cm   LV E/e' lateral: 4.4 LV SV:         61 LV SV Index:   31 LVOT Area:     2.84 cm  RIGHT VENTRICLE             IVC RV Basal diam:  3.30 cm     IVC diam: 1.80 cm RV S prime:     17.70 cm/s TAPSE (M-mode): 2.7 cm LEFT ATRIUM             Index        RIGHT ATRIUM           Index LA diam:        3.60 cm 1.84 cm/m   RA Area:     14.80 cm LA Vol (A2C):   41.8 ml 21.34 ml/m  RA Volume:   34.70 ml  17.72 ml/m LA Vol (A4C):   34.4 ml 17.56 ml/m LA Biplane Vol: 38.2 ml 19.50 ml/m  AORTIC VALVE AV Area (Vmax):    2.21 cm AV Area (Vmean):   2.22 cm AV Area (VTI):     2.43 cm AV Vmax:           158.00 cm/s AV Vmean:          105.000 cm/s AV VTI:            0.251 m AV Peak Grad:      10.0 mmHg AV Mean Grad:      5.0 mmHg LVOT Vmax:         123.00 cm/s LVOT Vmean:        82.300 cm/s LVOT VTI:          0.215 m LVOT/AV VTI ratio: 0.86  AORTA Ao Root diam: 3.20 cm Ao Asc diam:  2.80 cm MITRAL VALVE                TRICUSPID VALVE MV Area (PHT): 3.83 cm     TR Peak grad:   37.0 mmHg MV Area VTI:   2.29 cm     TR Vmax:        304.00 cm/s MV Peak grad:  5.3 mmHg MV Mean grad:  3.0 mmHg     SHUNTS MV Vmax:       1.15 m/s     Systemic VTI:  0.22 m MV Vmean:      80.5 cm/s    Systemic Diam: 1.90 cm MV Decel Time: 198 msec MV E velocity: 82.30 cm/s MV A velocity: 101.00 cm/s MV E/A ratio:  0.81 Arvilla Meres MD Electronically signed by Arvilla Meres MD Signature  Date/Time: 01/24/2021/3:24:36 PM    Final    IR PERCUTANEOUS ART THROMBECTOMY/INFUSION INTRACRANIAL INC DIAG ANGIO  Result Date: 01/28/2021 INDICATION: New onset global aphasia and right-sided hemiplegia. Occluded left  middle cerebral artery dominant inferior division proximally on CT angiogram of the head and neck. EXAM: 1. EMERGENT LARGE VESSEL OCCLUSION THROMBOLYSIS (anterior CIRCULATION) COMPARISON:  CT angiogram of the head and neck of January 23, 2021. MEDICATIONS: Ancef 2 g IV antibiotic was administered within 1 hour of the procedure. ANESTHESIA/SEDATION: General anesthesia. CONTRAST:  Omnipaque 300 approximately 65 mL. FLUOROSCOPY TIME:  Fluoroscopy Time: 15 minutes 42 seconds (792.8 mGy). COMPLICATIONS: None immediate. TECHNIQUE: Following a full explanation of the procedure along with the potential associated complications, an informed witnessed consent was obtained. The risks of intracranial hemorrhage of 10%, worsening neurological deficit, ventilator dependency, death and inability to revascularize were all reviewed in detail with the patient's spouse. The patient was then put under general anesthesia by the Department of Anesthesiology at [facility]. The right groin was prepped and draped in the usual sterile fashion. Thereafter using modified Seldinger technique, transfemoral access into the right common femoral artery was obtained without difficulty. Over an 0.035 inch guidewire an 8 French Pinnacle sheath was inserted. Through this, and also over an 0.035 inch guidewire a 5 [place] JB 1 catheter was advanced to the aortic arch region and selectively positioned in the left common carotid artery. FINDINGS: The left common carotid arteriogram demonstrates the left external carotid artery and its major branches to be widely patent. The left internal carotid artery at the bulb to the cranial skull base is widely patent. The petrous, the cavernous and the supraclinoid segments are widely  patent. A left posterior communicating artery is seen opacifying the left posterior cerebral artery distribution. Left anterior cerebral artery opacifies into the capillary and venous phases. The left middle cerebral artery demonstrates an early bifurcation with wide patency of the superior division. The inferior division demonstrates a proximal complete occlusion with a large area of hypoperfusion of the left parietal cortical and subcortical area. PROCEDURE: The diagnostic JB 1 catheter in the left common carotid artery was then exchanged over an 0.035 inch 300 cm Rosen exchange guidewire for an 087 95 cm balloon guide catheter which had been prepped with 50% contrast and 50% heparinized saline infusion. The guide catheter was advanced to the distal 1/3 of the left internal carotid artery. The guidewire was removed. Good aspiration obtained from the hub of the balloon guide catheter. A gentle control arteriogram performed through the balloon guide catheter demonstrated no change in the extracranial or intracranial circulations. Over an 0.014 inch standard Synchro micro guidewire with a moderate J configuration, an 021 160 microcatheter inside of an 055 132 cm Zoom aspiration catheter combination was advanced to the supraclinoid left ICA. Using a torque device, the micro guidewire was then gently advanced without any difficulty into the M3 region of the inferior division of the left middle cerebral artery followed by the microcatheter. The guidewire was removed. Good aspiration obtained from the hub of the microcatheter. Gentle contrast injection through the microcatheter demonstrated safe positioning of the tip of the microcatheter, which was connected to continuous heparinized saline infusion. At this time the Zoom aspiration catheter was advanced and positioned just proximal to the occluded left MCA inferior division. A 3 mm x 40 mm Solitaire X retrieval device was then advanced to the distal end of the  microcatheter. The O ring on the delivery microcatheter was loosened. With slight forward gentle traction with the right hand on the delivery micro guidewire with left hand the retrieval device was then advanced. With constant aspiration applied the hub of the balloon guide catheter  proximally with proximal flow arrest, and with a Penumbra aspiration device through the Zoom catheter for approximately 3 minutes, the combination of the retrieval device, the microcatheter and the Zoom aspiration catheter were retrieved and removed. Long segment of clot was seen entangled in the retrieval device. Following reversal of flow arrest, a control arteriogram performed through the balloon guide catheter in the left internal carotid artery now demonstrated complete revascularization of the inferior division. A TICI 3 MCA revascularization was achieved. The left posterior communicating artery and the left anterior cerebral artery remained widely patent. Moderate spasm in the proximal left inferior division responded to 3 aliquots of 25 mcg of nitroglycerin. A final control arteriogram performed through the balloon guide in the left common carotid artery continued to demonstrate wide patency of the left internal carotid extra cranially and intracranially. No evidence of intraluminal filling defects or of occlusion was noted. Balloon guide was removed. The 8 French Pinnacle sheath was removed with hemostasis in the right groin obtained with a 6 French Angio-Seal closure device, and manual compression with a quick clot for 15 minutes. Distal pulses remained palpable in all feet unchanged. A CT of the brain obtained demonstrated no evidence of mass effect or midline shift. There was mild hyperattenuation in the subarachnoid space in the left perisylvian area. Trial of extubation was unsuccessful as the patient was unresponsive. Patient was, therefore, left intubated and transferred to the neuro ICU for post revascularization care.  IMPRESSION: Status post endovascular complete revascularization of occluded inferior division of the left middle cerebral artery with 1 pass with a 3 mm x 40 mm Solitaire X retrieval device, and contact aspiration achieving a TICI 3 revascularization. PLAN: Follow-up as per referring MD. Electronically Signed   By: Julieanne Cotton M.D.   On: 01/28/2021 08:42   CT HEAD CODE STROKE WO CONTRAST  Result Date: 01/23/2021 CLINICAL DATA:  Code stroke. Neuro deficit, acute, stroke suspected. EXAM: CT HEAD WITHOUT CONTRAST TECHNIQUE: Contiguous axial images were obtained from the base of the skull through the vertex without intravenous contrast. COMPARISON:  None. FINDINGS: Brain: No abnormality of the brainstem or cerebellum. Equivocal loss of gray-white differentiation in the left temporal lobe. No other brain parenchymal finding. Vascular: Apparent hyperdense left M1 suggest embolic disease. Skull: Normal Sinuses/Orbits: Clear/normal Other: None ASPECTS ([place] Stroke Program Early CT Score) - Ganglionic level infarction (caudate, lentiform nuclei, internal capsule, insula, M1-M3 cortex): 6 - Supraganglionic infarction (M4-M6 cortex): 3 Total score (0-10 with 10 being normal): 9 IMPRESSION: 1. Question loss of gray-white differentiation in the left temporal lobe. Otherwise normal appearance of the brain parenchyma. Apparent hyperdense left M1 suggesting embolic disease. 2. ASPECTS is 9 3. These results were communicated to Dr. Thomasena Edis at 3:19 pm on 01/23/2021 by text page via the [facility] messaging system. Electronically Signed   By: Paulina Fusi M.D.   On: 01/23/2021 15:19   VAS [place] LOWER EXTREMITY VENOUS (DVT)  Result Date: 01/24/2021  Lower Venous DVT Study Patient Name:  MOOREA BOISSONNEAULT  Date of Exam:   01/24/2021 Medical Rec #: [id number]        Accession #:    [id number] Date of Birth: Apr 20, 1970       Patient Gender: F Patient Age:   21 years Exam Location:  [facility] Procedure:      VAS [place] LOWER  EXTREMITY VENOUS (DVT) Referring Phys: Scheryl Marten XU --------------------------------------------------------------------------------  Indications: Stroke.  Risk Factors: None identified. Limitations: Poor ultrasound/tissue interface and bandages. Comparison Study: No  prior studies. Performing Technologist: Chanda Busing RVT  Examination Guidelines: A complete evaluation includes B-mode imaging, spectral Doppler, color Doppler, and power Doppler as needed of all accessible portions of each vessel. Bilateral testing is considered an integral part of a complete examination. Limited examinations for reoccurring indications may be performed as noted. The reflux portion of the exam is performed with the patient in reverse Trendelenburg.  +---------+---------------+---------+-----------+----------+--------------+  RIGHT     Compressibility Phasicity Spontaneity Properties Thrombus Aging  +---------+---------------+---------+-----------+----------+--------------+  CFV       Full            Yes       Yes                                    +---------+---------------+---------+-----------+----------+--------------+  SFJ       Full                                                             +---------+---------------+---------+-----------+----------+--------------+  FV Prox   Full                                                             +---------+---------------+---------+-----------+----------+--------------+  FV Mid    Full                                                             +---------+---------------+---------+-----------+----------+--------------+  FV Distal Full                                                             +---------+---------------+---------+-----------+----------+--------------+  PFV       Full                                                             +---------+---------------+---------+-----------+----------+--------------+  POP       Partial         Yes       Yes                    Acute            +---------+---------------+---------+-----------+----------+--------------+  PTV       Full                                                             +---------+---------------+---------+-----------+----------+--------------+  PERO      Full                                                             +---------+---------------+---------+-----------+----------+--------------+  Soleal    None                                             Acute           +---------+---------------+---------+-----------+----------+--------------+  Gastroc   None                                             Acute           +---------+---------------+---------+-----------+----------+--------------+   +---------+---------------+---------+-----------+----------+--------------+  LEFT      Compressibility Phasicity Spontaneity Properties Thrombus Aging  +---------+---------------+---------+-----------+----------+--------------+  CFV       Full            Yes       Yes                                    +---------+---------------+---------+-----------+----------+--------------+  SFJ       Full                                                             +---------+---------------+---------+-----------+----------+--------------+  FV Prox   Full                                                             +---------+---------------+---------+-----------+----------+--------------+  FV Mid    Full                                                             +---------+---------------+---------+-----------+----------+--------------+  FV Distal Full                                                             +---------+---------------+---------+-----------+----------+--------------+  PFV       Full                                                             +---------+---------------+---------+-----------+----------+--------------+  POP       Full            Yes       Yes                                     +---------+---------------+---------+-----------+----------+--------------+  PTV       Full                                                             +---------+---------------+---------+-----------+----------+--------------+  PERO      Full                                                             +---------+---------------+---------+-----------+----------+--------------+  Gastroc   None                                             Acute           +---------+---------------+---------+-----------+----------+--------------+     Summary: RIGHT: - Findings consistent with acute deep vein thrombosis involving the right popliteal vein, right soleal veins, and right gastrocnemius veins. - No cystic structure found in the popliteal fossa.  LEFT: - Findings consistent with acute deep vein thrombosis involving the left gastrocnemius veins. - No cystic structure found in the popliteal fossa.  *See table(s) above for measurements and observations. Electronically signed by Heath Lark on 01/24/2021 at 3:34:06 PM.    Final    CT ANGIO HEAD CODE STROKE  Result Date: 01/23/2021 CLINICAL DATA:  Acute deficit, stroke suspected. Abnormal head CT. Left hemisphere insult. EXAM: CT ANGIOGRAPHY HEAD AND NECK TECHNIQUE: Multidetector CT imaging of the head and neck was performed using the standard protocol during bolus administration of intravenous contrast. Multiplanar CT image reconstructions and MIPs were obtained to evaluate the vascular anatomy. Carotid stenosis measurements (when applicable) are obtained utilizing NASCET criteria, using the distal internal carotid diameter as the denominator. CONTRAST:  75mL OMNIPAQUE IOHEXOL 350 MG/ML SOLN COMPARISON:  Head CT earlier same day FINDINGS: CTA NECK FINDINGS Aortic arch: Normal Right carotid system: Common carotid artery widely patent to the bifurcation. Calcified plaque at the carotid bifurcation but no stenosis. Cervical ICA is widely patent. Left carotid system: Common  carotid artery widely patent to the bifurcation. Calcified plaque at the carotid bifurcation and ICA bulb but no stenosis. Cervical ICA widely patent beyond that. Vertebral arteries: Both vertebral artery origins are widely patent. The left is dominant. Both vertebral arteries are patent through the cervical region to the foramen magnum. Skeleton: Scoliotic curvature which could be positional. No acute finding. Other neck: No soft tissue mass or lymphadenopathy. Upper chest: Negative Review of the MIP images confirms the above findings CTA HEAD FINDINGS Anterior circulation: Both internal carotid arteries are patent through the skull base and siphon region. On the right, the anterior and middle cerebral vessels are patent without large vessel occlusion or stenosis. On the left,  the anterior cerebral artery is widely patent. There is an embolus within the M1 segment extending over a length of about 8 mm. This corresponds with the hyperdense vessel on the CT. This results in marked stenosis of the vessel but not complete occlusion. More distal MCA branch vessels do show flow. There is fetal origin of the left PCA which appears widely patent. Posterior circulation: Both vertebral arteries widely patent to the basilar. No basilar stenosis. Posterior circulation branch vessels are patent. Venous sinuses: Patent and normal. Anatomic variants: None significant. Review of the MIP images confirms the above findings IMPRESSION: Acute embolus to the left M1 segment extending over length of about 8 mm. Small amount of flow within the left MCA does opacify the more distal branch vessels. Atherosclerotic calcification at both carotid bifurcations but without measurable stenosis. Results discussed with Dr. Thomasena Edis during interpretation at 1531 hours. Electronically Signed   By: Paulina Fusi M.D.   On: 01/23/2021 15:36   CT ANGIO NECK CODE STROKE  Result Date: 01/23/2021 CLINICAL DATA:  Acute deficit, stroke suspected.  Abnormal head CT. Left hemisphere insult. EXAM: CT ANGIOGRAPHY HEAD AND NECK TECHNIQUE: Multidetector CT imaging of the head and neck was performed using the standard protocol during bolus administration of intravenous contrast. Multiplanar CT image reconstructions and MIPs were obtained to evaluate the vascular anatomy. Carotid stenosis measurements (when applicable) are obtained utilizing NASCET criteria, using the distal internal carotid diameter as the denominator. CONTRAST:  75mL OMNIPAQUE IOHEXOL 350 MG/ML SOLN COMPARISON:  Head CT earlier same day FINDINGS: CTA NECK FINDINGS Aortic arch: Normal Right carotid system: Common carotid artery widely patent to the bifurcation. Calcified plaque at the carotid bifurcation but no stenosis. Cervical ICA is widely patent. Left carotid system: Common carotid artery widely patent to the bifurcation. Calcified plaque at the carotid bifurcation and ICA bulb but no stenosis. Cervical ICA widely patent beyond that. Vertebral arteries: Both vertebral artery origins are widely patent. The left is dominant. Both vertebral arteries are patent through the cervical region to the foramen magnum. Skeleton: Scoliotic curvature which could be positional. No acute finding. Other neck: No soft tissue mass or lymphadenopathy. Upper chest: Negative Review of the MIP images confirms the above findings CTA HEAD FINDINGS Anterior circulation: Both internal carotid arteries are patent through the skull base and siphon region. On the right, the anterior and middle cerebral vessels are patent without large vessel occlusion or stenosis. On the left, the anterior cerebral artery is widely patent. There is an embolus within the M1 segment extending over a length of about 8 mm. This corresponds with the hyperdense vessel on the CT. This results in marked stenosis of the vessel but not complete occlusion. More distal MCA branch vessels do show flow. There is fetal origin of the left PCA which appears  widely patent. Posterior circulation: Both vertebral arteries widely patent to the basilar. No basilar stenosis. Posterior circulation branch vessels are patent. Venous sinuses: Patent and normal. Anatomic variants: None significant. Review of the MIP images confirms the above findings IMPRESSION: Acute embolus to the left M1 segment extending over length of about 8 mm. Small amount of flow within the left MCA does opacify the more distal branch vessels. Atherosclerotic calcification at both carotid bifurcations but without measurable stenosis. Results discussed with Dr. Thomasena Edis during interpretation at 1531 hours. Electronically Signed   By: Paulina Fusi M.D.   On: 01/23/2021 15:36    Labs:  CBC: Recent Labs    01/25/21 0558 01/26/21 0453  01/27/21 0340 01/28/21 0312  WBC 10.7* 10.2 10.9* 10.5  HGB 10.9* 11.8* 12.2 12.5  HCT 32.2* 34.4* 35.4* 37.0  PLT 174 166 176 179    COAGS: Recent Labs    01/23/21 1501  INR 1.0  APTT 24    BMP: Recent Labs    01/25/21 0558 01/26/21 0453 01/27/21 0340 01/28/21 0312  NA 138 135 136 133*  K 3.4* 3.9 3.3* 4.5  CL 104 103 101 101  CO2 23 21* 23 21*  GLUCOSE 191* 233* 153* 118*  BUN CALCIUM 8.7* 8.9 9.3 9.3  CREATININE 0.58 0.59 0.57 0.59  GFRNONAA >60 >60 >60 >60    LIVER FUNCTION TESTS: Recent Labs    01/23/21 1501  BILITOT 0.8  AST 25  ALT 26  ALKPHOS 69  PROT 6.9  ALBUMIN 3.9    TUMOR MARKERS: No results for input(s): AFPTM, CEA, CA199, CHROMGRNA in the last 8760 hours.  Assessment and Plan: L MCA infarct s/p M1 thrombectomy by Dr. Corliss Skains 01/23/21 Patient stable from procedure.  No complications as groin site.   Acute bilateral DVT IR consulted for IVC filter placement.  Case reviewed by Dr. Milford Cage who has approved patient for the procedure.  Given that the patient has discontinue aspirin 12/26, and heparin gtt as of yesterday would like to proceed today if possible.  Husband, Ronalee Scheunemann, provides  consent as patient is s/p TEE this AM.  She has been NPO.    Thank you for this interesting consult.  I greatly enjoyed meeting [name] and look forward to participating in their care.  A copy of this report was sent to the requesting provider on this date.  Electronically Signed: Hoyt Koch, PA 01/28/2021, 11:26 AM   I spent a total of 40 Minutes    in face to face in clinical consultation, greater than 50% of which was counseling/coordinating care for bilateral acute LE DVT.

## 2021-01-29 ENCOUNTER — Inpatient Hospital Stay (HOSPITAL_COMMUNITY): Payer: Federal, State, Local not specified - PPO

## 2021-01-29 DIAGNOSIS — I82403 Acute embolism and thrombosis of unspecified deep veins of lower extremity, bilateral: Secondary | ICD-10-CM

## 2021-01-29 DIAGNOSIS — Q2112 Patent foramen ovale: Secondary | ICD-10-CM

## 2021-01-29 DIAGNOSIS — R519 Headache, unspecified: Secondary | ICD-10-CM | POA: Diagnosis not present

## 2021-01-29 LAB — GLUCOSE, CAPILLARY
Glucose-Capillary: 125 mg/dL — ABNORMAL HIGH (ref 70–99)
Glucose-Capillary: 158 mg/dL — ABNORMAL HIGH (ref 70–99)

## 2021-01-29 LAB — BASIC METABOLIC PANEL
Anion gap: 12 (ref 5–15)
BUN: 13 mg/dL (ref 6–20)
CO2: 23 mmol/L (ref 22–32)
Calcium: 9.5 mg/dL (ref 8.9–10.3)
Chloride: 99 mmol/L (ref 98–111)
Creatinine, Ser: 0.66 mg/dL (ref 0.44–1.00)
GFR, Estimated: >60 mL/min (ref >60)
Glucose, Bld: 107 mg/dL — ABNORMAL HIGH (ref 70–99)
Potassium: 3.6 mmol/L (ref 3.5–5.1)
Sodium: 134 mmol/L — ABNORMAL LOW (ref 135–145)

## 2021-01-29 LAB — CBC
HCT: 36.8 % (ref 36.0–46.0)
Hemoglobin: 13 g/dL (ref 12.0–15.0)
MCH: 32.3 pg (ref 26.0–34.0)
MCHC: 35.3 g/dL (ref 30.0–36.0)
MCV: 91.5 fL (ref 80.0–100.0)
Platelets: 184 10*3/uL (ref 150–400)
RBC: 4.02 MIL/uL (ref 3.87–5.11)
RDW: 11.7 % (ref 11.5–15.5)
WBC: 12.5 10*3/uL — ABNORMAL HIGH (ref 4.0–10.5)
nRBC: 0 % (ref 0.0–0.2)

## 2021-01-29 LAB — PROTHROMBIN GENE MUTATION

## 2021-01-29 MED ORDER — BUTALBITAL-APAP-CAFFEINE 50-325-40 MG PO TABS: 1.0000 | ORAL_TABLET | Freq: Four times a day (QID) | ORAL | 0 refills | Status: DC | PRN

## 2021-01-29 MED ORDER — ATORVASTATIN CALCIUM 40 MG PO TABS: 40.0000 mg | ORAL_TABLET | Freq: Every day | ORAL | 0 refills | Status: AC

## 2021-01-29 NOTE — Progress Notes (Signed)
Patient ready for discharge to home; discharge instructions given and reviewed;RX given; patient discharged out accompanied home by her spouse.

## 2021-01-29 NOTE — TOC Transition Note (Signed)
Transition of Care [facility]) - CM/SW Discharge Note   Patient Details  Name: [name] MRN: [id number] Date of Birth: 1971-01-15  Transition of Care [facility] CM/SW Contact:  Kermit Balo, RN Phone Number: 01/29/2021, 12:47 PM   Clinical Narrative:    Patient discharging home with outpatient therapy at [facility]. Orders in Epic and information on the AVS. Pt has support at home and transport to home.   Final next level of care: OP Rehab Barriers to Discharge: No Barriers Identified   Patient Goals and CMS Choice   CMS Medicare.gov Compare Post Acute Care list provided to:: Patient Represenative (must comment) Choice offered to / list presented to : Patient, Spouse  Discharge Placement                       Discharge Plan and Services   Discharge Planning Services: CM Consult                                 Social Determinants of Health (SDOH) Interventions     Readmission Risk Interventions No flowsheet data found.

## 2021-01-29 NOTE — Discharge Summary (Addendum)
Dr. Excell Seltzer for PFO closure F/u with hematology F/u with Neuro Small pfo on TEE   Stroke Discharge Summary  Patient ID: [name]   MRN: [id number]      DOB: 10-17-1970  Date of Admission: 01/23/2021 Date of Discharge: 01/29/2021  Attending Physician:  Stroke, Md, MD, Stroke MD Consultant(s):    Cardiology Patient's PCP:  No primary care provider on file.  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 Start taking on: January 30, 2021 What changed:  medication strength how much to take when to take this   butalbital-acetaminophen-caffeine 50-325-40 MG tablet Commonly known as: FIORICET Take 1 tablet by mouth every 6 (six) hours as needed for headache.   gabapentin 100 MG capsule Commonly known as: NEURONTIN Take 100 mg by mouth every evening.   lisinopril-hydrochlorothiazide 10-12.5 MG tablet Commonly known as: ZESTORETIC Take 1 tablet by mouth every evening.   metFORMIN 500 MG 24 hr tablet Commonly known as: GLUCOPHAGE-XR Take 500 mg by mouth daily with supper.   triamcinolone cream 0.1 % Commonly known as: KENALOG Apply 1 application topically 2 (two) times daily as needed.        LABORATORY STUDIES CBC    Component Value Date/Time   WBC 12.5 (H) 01/29/2021 0227   RBC 4.02 01/29/2021 0227   HGB 13.0  01/29/2021 0227   HCT 36.8 01/29/2021 0227   PLT 184 01/29/2021 0227   MCV 91.5 01/29/2021 0227   MCH 32.3 01/29/2021 0227   MCHC 35.3 01/29/2021 0227   RDW 11.7 01/29/2021 0227   LYMPHSABS 1.9 01/24/2021 0509   MONOABS 0.3 01/24/2021 0509   EOSABS 0.0 01/24/2021 0509   BASOSABS 0.0 01/24/2021 0509   CMP    Component Value Date/Time   NA 134 (L) 01/29/2021 0227   K 3.6 01/29/2021 0227   CL 99 01/29/2021 0227   CO2 23 01/29/2021 0227   GLUCOSE 107 (H) 01/29/2021 0227   BUN 13 01/29/2021 0227   CREATININE 0.66 01/29/2021 0227   CALCIUM 9.5 01/29/2021 0227   PROT 6.9 01/23/2021 1501   ALBUMIN 3.9 01/23/2021 1501   AST 25 01/23/2021 1501   ALT 26 01/23/2021 1501   ALKPHOS 69 01/23/2021 1501   BILITOT 0.8 01/23/2021 1501   GFRNONAA >60 01/29/2021 0227   COAGS Lab Results  Component Value Date   INR 1.0 01/23/2021   Lipid Panel    Component Value Date/Time   CHOL 129 01/24/2021 0509   TRIG 114 01/24/2021 0509   TRIG 112 01/24/2021 0509   HDL 33 (L) 01/24/2021 0509   CHOLHDL 3.9 01/24/2021 0509   VLDL 23 01/24/2021 0509   LDLCALC 73 01/24/2021 0509   HgbA1C  Lab Results  Component Value Date   HGBA1C 11.1 (  H) 01/24/2021   Urinalysis No results found for: COLORURINE, APPEARANCEUR, LABSPEC, PHURINE, GLUCOSEU, HGBUR, BILIRUBINUR, KETONESUR, PROTEINUR, UROBILINOGEN, NITRITE, LEUKOCYTESUR Urine Drug Screen     Component Value Date/Time   LABOPIA NONE DETECTED 01/24/2021 1712   COCAINSCRNUR NONE DETECTED 01/24/2021 1712   LABBENZ NONE DETECTED 01/24/2021 1712   AMPHETMU NONE DETECTED 01/24/2021 1712   THCU NONE DETECTED 01/24/2021 1712   LABBARB NONE DETECTED 01/24/2021 1712    Alcohol Level No results found for: ETH   SIGNIFICANT DIAGNOSTIC STUDIES CT HEAD WO CONTRAST ( )  Result Date: 01/29/2021 CLINICAL DATA:  Follow-up hemorrhagic stroke with left-sided headache right EXAM: CT HEAD WITHOUT CONTRAST TECHNIQUE: Contiguous axial images were obtained from  the base of the skull through the vertex without intravenous contrast. COMPARISON:  Two days ago FINDINGS: Brain: Left hemispheric infarcts with hemorrhage at the level of the insula and local subarachnoid hemorrhage is also patchy early seen at the left vertex. No progression of these insult since prior. There is question of cytotoxic edema newly seen in the left parietal lobe. No significant midline shift and no hydrocephalus. Vascular: No hyperdense vessel or unexpected calcification. Skull: Normal. Negative for fracture or focal lesion. Sinuses/Orbits: No acute finding.  Bilateral cataract resection. IMPRESSION: 1. Question acute left parietal infarction since most recent head CT and MRI, located near mild subarachnoid hemorrhage at the left vertex. 2. No progression of left insular hemorrhage and adjacent infarction. Electronically Signed   By: Tiburcio Pea M.D.   On: 01/29/2021 08:23   CT HEAD WO CONTRAST ( )  Result Date: 01/27/2021 CLINICAL DATA:  Stroke, follow-up EXAM: CT HEAD WITHOUT CONTRAST TECHNIQUE: Contiguous axial images were obtained from the base of the skull through the vertex without intravenous contrast. COMPARISON:  01/25/2021 FINDINGS: Brain: Intraparenchymal hemorrhage, centered in the left temporal lobe, possibly involving the lateral aspect of the left basal ganglia, measuring approximately 4.1 x 2.1 x 2.0 cm (AP x TR x CC) (series 3, image 12 and series 5, image 33). Surrounding hypodensity, most likely edema, with mild mass effect narrowing the left lateral ventricle, which does not appear significantly changed compared to previous mass effect seen on 01/25/2021. No significant midline shift. Adjacent to the intraparenchymal hemorrhage, there is additional subarachnoid hemorrhage extending more superiorly into the left frontal lobe and inferiorly into the temporal lobe. Subarachnoid hemorrhage is also seen in the left posterior frontal lobe and anterior parietal lobe (series 3,  image 23). Possible trace subdural hemorrhage overlying the left frontal lobe (series 3, image 18) versus additional subarachnoid hemorrhage. Vascular: No hyperdense vessel. Skull: Normal. Negative for fracture or focal lesion. Sinuses/Orbits: No acute finding. Other: The mastoids are well aerated. IMPRESSION: 1. Intraparenchymal hemorrhage in the left temporal lobe, concerning for hemorrhagic transformation of the previously noted infarct involving the insula. Mild surrounding edema with mass effect narrowing the left lateral ventricle, not significantly changed from the prior CT. No midline shift. 2. Subarachnoid hemorrhage surrounding the intraparenchymal hemorrhage, extending into the left frontal and temporal lobes, with additional subarachnoid hemorrhage in the left posterior frontal lobe and anterior parietal lobe. 3. Possible trace subdural hemorrhage overlying the left frontal lobe. These results were called by telephone at the time of interpretation on 01/27/2021 at 4:59 pm to provider [facility] , who verbally acknowledged these results. Electronically Signed   By: Wiliam Ke M.D.   On: 01/27/2021 17:02   CT HEAD WO CONTRAST ( )  Result Date: 01/25/2021 CLINICAL DATA:  Neuro deficit, acute, stroke suspected.  Follow-up.  EXAM: CT HEAD WITHOUT CONTRAST TECHNIQUE: Contiguous axial images were obtained from the base of the skull through the vertex without intravenous contrast. COMPARISON:  MRI yesterday.  CT studies 01/23/2021. FINDINGS: Brain: No abnormality affects the brainstem or cerebellum. The right cerebral hemisphere is normal. There is swelling in the left basal ganglia and to a lesser extent in the inferior insula consistent with infarctions shown by MRI. No evidence of hemorrhagic transformation. No new insult is seen. No hydrocephalus or extra-axial collection. Vascular: No abnormal vascular finding. Skull: Negative Sinuses/Orbits: Clear/normal Other: None IMPRESSION: Low-density and  swelling in the left basal ganglia and the inferior insula consistent with the areas of acute infarction shown by MRI. No evidence of hemorrhagic transformation by CT or of midline shift. Electronically Signed   By: Paulina Fusi M.D.   On: 01/25/2021 12:40   MR BRAIN WO CONTRAST  Addendum Date: 01/24/2021   ADDENDUM REPORT: 01/24/2021 17:38 ADDENDUM: Findings in impressions 2 and 4 called to Dr. Viviann Spare by telephone at approximately 5:30 p.m. on 01/24/2021. Electronically Signed   By: Jackey Loge D.O.   On: 01/24/2021 17:38   Result Date: 01/24/2021 CLINICAL DATA:  Provided history: Stroke, follow-up. EXAM: MRI HEAD WITHOUT CONTRAST TECHNIQUE: Multiplanar, multiecho pulse sequences of the brain and surrounding structures were obtained without intravenous contrast. COMPARISON:  CT angiogram head/neck and non-contrast head CT 01/23/2021. FINDINGS: Brain: Cerebral volume is normal. Acute infarcts involving much of the left caudate body and left putamen. Small acute infarcts are also present within the anterior limb of internal capsule, left insula/subinsular white matter as well as cortex of the superior left temporal lobe. SWI signal loss at site of the acute infarcts within the left basal ganglia, likely reflecting petechial hemorrhage (Heidelberg 1b, HI 2- confluent petechiae, no mass effect). Mild petechial hemorrhage is also present along the left sylvian fissure. 2-3 mm acute cortical infarct within the left occipital lobe (series 2, image 30). Small-to-moderate volume acute subarachnoid hemorrhage along the left cerebral hemisphere, not significantly increased in volume as compared to the postprocedural head CT yesterday, but with interval redistribution. No evidence of an intracranial mass. No midline shift. Vascular: Maintained flow voids within the proximal large arterial vessels. Skull and upper cervical spine: No focal suspicious marrow lesion. Visualized orbits show no acute finding. Bilateral lens  replacements. Trace mucosal thickening within the bilateral ethmoid, sphenoid and right maxillary sinuses. Sinuses/Orbits: Maintained flow voids within the proximal large arterial vessels. 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 Susceptibility-weighted signal loss at site of the acute infarcts within the left basal ganglia, likely reflecting petechial hemorrhage (Heidelberg 1b, HI2- confluent petechiae, no mass effect). Consider a non-contrast head CT for CT characterization of this hemorrhage, and for a more direct comparison with the postprocedural head CT performed yesterday. Mild petechial hemorrhage along the left sylvian fissure. 2-3 mm acute cortical infarct within the left occipital lobe (left PCA territory). Small-to-moderate volume acute subarachnoid hemorrhage along the left cerebral hemisphere, not significantly changed in volume, but with interval redistribution. Electronically Signed: By: Jackey Loge D.O. On: 01/24/2021 17:21   IR IVC FILTER PLMT / S&I Lenise Arena GUID/MOD SED  Result Date: 01/28/2021 INDICATION: Intracranial hemorrhage.  Unable to anticoagulate. EXAM: 1. ULTRASOUND GUIDANCE FOR VASCULAR ACCESS 2. IVC CATHETERIZATION AND VENOGRAM 3. IVC FILTER INSERTION MEDICATIONS: None. ANESTHESIA/SEDATION: Fentanyl 50 mcg IV; Versed 2 mg IV Sedation Time:  65 minutes; The patient was continuously monitored during the procedure by the interventional radiology nurse under my direct supervision. CONTRAST:  50mL OMNIPAQUE IOHEXOL 300 MG/ML  SOLN FLUOROSCOPY TIME:  1 minutes 0 seconds (11 mGy) COMPLICATIONS: None immediate. PROCEDURE: Informed written consent was obtained from the the patient and/or patient's representative following explanation of the procedure, risks, benefits and alternatives. A time out was performed prior to the initiation of the  procedure. Maximal barrier sterile technique utilized including caps, mask, sterile gowns, sterile gloves, large sterile drape, hand hygiene, and Betadine prep. Under sterile condition and local anesthesia, right common femoral venous access was performed with ultrasound. An ultrasound image was saved and sent to PACS. Over a guidewire, the IVC filter delivery sheath and inner dilator were advanced into the IVC just above the IVC bifurcation. Contrast injection was performed for an IVC venogram. Through the delivery sheath, a retrievable Denali IVC filter was deployed below the level of the renal veins and above the IVC bifurcation. Limited post deployment venacavagram was performed. The delivery sheath was removed and hemostasis was obtained with manual compression. A dressing was placed. The patient tolerated the procedure well without immediate post procedural complication. FINDINGS: 1. The IVC is patent. No evidence of thrombus, stenosis, or occlusion. No variant venous anatomy. 2. Successful placement of the IVC filter below the level of the renal veins. IMPRESSION: Successful placement of a retrievable, infrarenal IVC filter as above. PLAN: IVC filters can cause complications when left in place for extended periods of time. If medically appropriate, recommend discontinuing filter prior to discharge. Please re-evaluate the patient for filter discontinuation when they are seen in follow up, and refer patient back to Interventional Radiology for removal. Roanna Banning, MD [facility] Electronically Signed   By: Roanna Banning M.D.   On: 01/28/2021 21:12   IR CT Head Ltd  Result Date: 01/28/2021 INDICATION: New onset global aphasia and right-sided hemiplegia. 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 EXAM: 1. EMERGENT LARGE VESSEL OCCLUSION THROMBOLYSIS (anterior CIRCULATION)  COMPARISON:  CT angiogram of the head and neck of January 23, 2021. MEDICATIONS: Ancef 2 g IV antibiotic was administered within 1 hour of the procedure. ANESTHESIA/SEDATION: General anesthesia. CONTRAST:  Omnipaque 300 approximately 65 mL. FLUOROSCOPY TIME:  Fluoroscopy Time: 15 minutes 42 seconds (792.8 mGy). COMPLICATIONS: None immediate. TECHNIQUE: Following a full explanation of the procedure along with the potential associated complications, an informed witnessed consent was obtained. The risks of intracranial hemorrhage of 10%, worsening neurological deficit, ventilator dependency, death and inability to revascularize were all reviewed in detail with the patient's spouse. The patient was then put under general anesthesia by the Department of Anesthesiology at [facility]. The right groin was prepped and draped in the usual sterile fashion. Thereafter using modified Seldinger technique, transfemoral access into the right common femoral artery was obtained without difficulty. Over an 0.035 inch guidewire an 8 French Pinnacle sheath was inserted. Through this, and also over an 0.035 inch guidewire a 5 [place] JB 1 catheter was advanced to the aortic arch region and selectively positioned in the left common carotid artery. FINDINGS: The left common carotid arteriogram demonstrates the left external carotid artery and its major branches to be widely patent. The left internal carotid artery at the bulb to the cranial skull base is widely patent. The petrous, the cavernous and the supraclinoid segments are widely patent. A left posterior communicating artery  is seen opacifying the left posterior cerebral artery distribution. Left anterior cerebral artery opacifies into the capillary and venous phases. The left middle cerebral artery demonstrates an early bifurcation with wide patency of the superior division. The inferior division demonstrates a proximal complete occlusion with a large area of hypoperfusion  of the left parietal cortical and subcortical area. PROCEDURE: The diagnostic JB 1 catheter in the left common carotid artery was then exchanged over an 0.035 inch 300 cm Rosen exchange guidewire for an 087 95 cm balloon guide catheter which had been prepped with 50% contrast and 50% heparinized saline infusion. The guide catheter was advanced to the distal 1/3 of the left internal carotid artery. The guidewire was removed. Good aspiration obtained from the hub of the balloon guide catheter. A gentle control arteriogram performed through the balloon guide catheter demonstrated no change in the extracranial or intracranial circulations. Over an 0.014 inch standard Synchro micro guidewire with a moderate J configuration, an 021 160 microcatheter inside of an 055 132 cm Zoom aspiration catheter combination was advanced to the supraclinoid left ICA. Using a torque device, the micro guidewire was then gently advanced without any difficulty into the M3 region of the inferior division of the left middle cerebral artery followed by the microcatheter. The guidewire was removed. Good aspiration obtained from the hub of the microcatheter. Gentle contrast injection through the microcatheter demonstrated safe positioning of the tip of the microcatheter, which was connected to continuous heparinized saline infusion. At this time the Zoom aspiration catheter was advanced and positioned just proximal to the occluded left MCA inferior division. A 3 mm x 40 mm Solitaire X retrieval device was then advanced to the distal end of the microcatheter. The O ring on the delivery microcatheter was loosened. With slight forward gentle traction with the right hand on the delivery micro guidewire with left hand the retrieval device was then advanced. With constant aspiration applied the hub of the balloon guide catheter proximally with proximal flow arrest, and with a Penumbra aspiration device through the Zoom catheter for approximately 3  minutes, the combination of the retrieval device, the microcatheter and the Zoom aspiration catheter were retrieved and removed. Long segment of clot was seen entangled in the retrieval device. Following reversal of flow arrest, a control arteriogram performed through the balloon guide catheter in the left internal carotid artery now demonstrated complete revascularization of the inferior division. A TICI 3 MCA revascularization was achieved. The left posterior communicating artery and the left anterior cerebral artery remained widely patent. Moderate spasm in the proximal left inferior division responded to 3 aliquots of 25 mcg of nitroglycerin. A final control arteriogram performed through the balloon guide in the left common carotid artery continued to demonstrate wide patency of the left internal carotid extra cranially and intracranially. No evidence of intraluminal filling defects or of occlusion was noted. Balloon guide was removed. The 8 French Pinnacle sheath was removed with hemostasis in the right groin obtained with a 6 French Angio-Seal closure device, and manual compression with a quick clot for 15 minutes. Distal pulses remained palpable in all feet unchanged. A CT of the brain obtained demonstrated no evidence of mass effect or midline shift. There was mild hyperattenuation in the subarachnoid space in the left perisylvian area. Trial of extubation was unsuccessful as the patient was unresponsive. Patient was, therefore, left intubated and transferred to the neuro ICU for post revascularization care. IMPRESSION: Status post endovascular complete revascularization of occluded inferior division of the left  middle cerebral artery with 1 pass with a 3 mm x 40 mm Solitaire X retrieval device, and contact aspiration achieving a TICI 3 revascularization. PLAN: Follow-up as per referring MD. Electronically Signed   By: Julieanne Cotton M.D.   On: 01/28/2021 08:42   DG Abd Portable 1V  Result Date:  01/28/2021 CLINICAL DATA:  Evaluate for IVC filter. EXAM: PORTABLE ABDOMEN - 1 VIEW COMPARISON:  Chest XR, 04/04/2012 FINDINGS: The bowel gas pattern is normal. No radio-opaque calculi or other significant radiographic abnormality are seen. IMPRESSION: 1. Nonobstructed bowel gas pattern. 2. No pre-existing IVC filter. Electronically Signed   By: Roanna Banning M.D.   On: 01/28/2021 09:37   ECHO TEE  Result Date: 01/28/2021    TRANSESOPHOGEAL ECHO REPORT   Patient Name:   [name] Date of Exam: 01/28/2021 Medical Rec #:  [id number]       Height:       66.0 in Accession #:    [id number]      Weight:       190.5 lb Date of Birth:  [birth year]      BSA:          1.959 m Patient Age:    50 years        BP:           101/66 mmHg Patient Gender: F               HR:           88 bpm. Exam Location:  Inpatient Procedure: Cardiac Doppler, Transesophageal Echo, 3D Echo, Color Doppler and            Saline Contrast Bubble Study Indications:     PFO  History:         Patient has prior history of Echocardiogram examinations, most                  recent 01/24/2021.  Sonographer:     Eulah Pont RDCS Referring Phys:  [id number] Lennox Solders DE [place] TORRE Diagnosing Phys: Zoila Shutter MD PROCEDURE: After discussion of the risks and benefits of a TEE, an informed consent was obtained from the patient. The transesophogeal probe was passed without difficulty through the esophogus of the patient. Local oropharyngeal anesthetic was provided with Cetacaine. Sedation performed by different physician. The patient was monitored while under deep sedation. Anesthestetic sedation was provided intravenously by Anesthesiology: 278.16mg  of Propofol. The patient's vital signs; including heart rate, blood pressure, and oxygen saturation; remained stable throughout the procedure. The patient developed no complications during the procedure. IMPRESSIONS  1. Left ventricular ejection fraction, by estimation, is 55 to 60%. The left ventricle has  normal function. The left ventricle has no regional wall motion abnormalities.  2. Right ventricular systolic function is normal. The right ventricular size is normal.  3. No left atrial/left atrial appendage thrombus was detected.  4. The mitral valve is normal in structure. No evidence of mitral valve regurgitation.  5. The aortic valve is tricuspid. Aortic valve regurgitation is trivial.  6. Evidence of atrial level shunting detected by color flow Doppler. Agitated saline contrast bubble study was positive with shunting observed within 3-6 cardiac cycles suggestive of interatrial shunt. There is a small patent foramen ovale with bidirectional shunting across atrial septum. Conclusion(s)/Recommendation(s): Findings are concerning for an interatrial shunt as detailed above. Consider outpatient PFO closure after recovery from intracerebral hemorrhage. FINDINGS  Left Ventricle: Left ventricular ejection fraction, by estimation, is 55 to  60%. The left ventricle has normal function. The left ventricle has no regional wall motion abnormalities. The left ventricular internal cavity size was normal in size. There is  no left ventricular hypertrophy. Right Ventricle: The right ventricular size is normal. No increase in right ventricular wall thickness. Right ventricular systolic function is normal. Left Atrium: Left atrial size was normal in size. No left atrial/left atrial appendage thrombus was detected. Right Atrium: Right atrial size was normal in size. Pericardium: There is no evidence of pericardial effusion. Mitral Valve: The mitral valve is normal in structure. No evidence of mitral valve regurgitation. Tricuspid Valve: The tricuspid valve is grossly normal. Tricuspid valve regurgitation is trivial. Aortic Valve: The aortic valve is tricuspid. Aortic valve regurgitation is trivial. Pulmonic Valve: The pulmonic valve was normal in structure. Pulmonic valve regurgitation is not visualized. Aorta: The aortic root and  ascending aorta are structurally normal, with no evidence of dilitation. Venous: The left upper pulmonary vein and left lower pulmonary vein are normal. IAS/Shunts: The interatrial septum is aneurysmal. Evidence of atrial level shunting detected by color flow Doppler. Agitated saline contrast was given intravenously to evaluate for intracardiac shunting. Agitated saline contrast bubble study was positive  with shunting observed within 3-6 cardiac cycles suggestive of interatrial shunt. A small patent foramen ovale is detected with bidirectional shunting across atrial septum. Zoila Shutter MD Electronically signed by Zoila Shutter MD Signature Date/Time: 01/28/2021/9:35:33 AM    Final    ECHOCARDIOGRAM COMPLETE BUBBLE STUDY  Result Date: 01/24/2021    ECHOCARDIOGRAM REPORT   Patient Name:   [name] Date of Exam: 01/24/2021 Medical Rec #:  [id number]       Height:       66.0 in Accession #:    [id number]      Weight:       190.5 lb Date of Birth:  1970-09-03      BSA:          1.959 m Patient Age:    50 years        BP:           140/73 mmHg Patient Gender: F               HR:           101 bpm. Exam Location:  Inpatient Procedure: 2D Echo, Cardiac Doppler, Color Doppler and Saline Contrast Bubble            Study Indications:    Stroke  History:        Patient has no prior history of Echocardiogram examinations.                 Risk Factors:Hypertension, Diabetes and Dyslipidemia.  Sonographer:    Ross Ludwig RDCS (AE) Referring Phys: [id number] [address] [facility] IMPRESSIONS  1. Left ventricular ejection fraction, by estimation, is 65 to 70%. The left ventricle has normal function. The left ventricle has no regional wall motion abnormalities. Left ventricular diastolic parameters are consistent with Grade I diastolic dysfunction (impaired relaxation).  2. Right ventricular systolic function is normal. The right ventricular size is normal. There is mildly elevated pulmonary artery systolic pressure.  3. Left  atrial size was mildly dilated.  4. The mitral valve is normal in structure. No evidence of mitral valve regurgitation. No evidence of mitral stenosis.  5. The aortic valve is normal in structure. There is mild calcification of the aortic valve. Aortic valve regurgitation is not visualized. No aortic stenosis is  present.  6. The inferior vena cava is normal in size with greater than 50% respiratory variability, suggesting right atrial pressure of 3 mmHg.  7. Agitated saline contrast bubble study was positive with shunting observed within 3-6 cardiac cycles suggestive of interatrial shunt. The bubble study was mildly positive on the first few beats but markedly positive on the 5th beat. Suspect intracardiac shunting but may also have extracardai source. Suggest TEE to further evaluate, if clinically indicated. FINDINGS  Left Ventricle: Left ventricular ejection fraction, by estimation, is 65 to 70%. The left ventricle has normal function. The left ventricle has no regional wall motion abnormalities. The left ventricular internal cavity size was normal in size. There is  no left ventricular hypertrophy. Left ventricular diastolic parameters are consistent with Grade I diastolic dysfunction (impaired relaxation). Right Ventricle: The right ventricular size is normal. No increase in right ventricular wall thickness. Right ventricular systolic function is normal. There is mildly elevated pulmonary artery systolic pressure. The tricuspid regurgitant velocity is 3.04  m/s, and with an assumed right atrial pressure of 8 mmHg, the estimated right ventricular systolic pressure is 45.0 mmHg. Left Atrium: Left atrial size was mildly dilated. Right Atrium: Right atrial size was normal in size. Pericardium: There is no evidence of pericardial effusion. Mitral Valve: The mitral valve is normal in structure. No evidence of mitral valve regurgitation. No evidence of mitral valve stenosis. MV peak gradient, 5.3 mmHg. The mean mitral  valve gradient is 3.0 mmHg. Tricuspid Valve: The tricuspid valve is normal in structure. Tricuspid valve regurgitation is mild . No evidence of tricuspid stenosis. Aortic Valve: The aortic valve is normal in structure. There is mild calcification of the aortic valve. Aortic valve regurgitation is not visualized. No aortic stenosis is present. Aortic valve mean gradient measures 5.0 mmHg. Aortic valve peak gradient measures 10.0 mmHg. Aortic valve area, by VTI measures 2.43 cm. Pulmonic Valve: The pulmonic valve was normal in structure. Pulmonic valve regurgitation is not visualized. No evidence of pulmonic stenosis. Aorta: The aortic root is normal in size and structure. Venous: The inferior vena cava is normal in size with greater than 50% respiratory variability, suggesting right atrial pressure of 3 mmHg. IAS/Shunts: No atrial level shunt detected by color flow Doppler. Agitated saline contrast was given intravenously to evaluate for intracardiac shunting. Agitated saline contrast bubble study was positive with shunting observed within 3-6 cardiac cycles suggestive of interatrial shunt.  LEFT VENTRICLE PLAX 2D LVIDd:         4.10 cm   Diastology LVIDs:         2.40 cm   LV e' medial:    15.60 cm/s LV PW:         0.80 cm   LV E/e' medial:  5.3 LV IVS:        0.90 cm   LV e' lateral:   18.90 cm/s LVOT diam:     1.90 cm   LV E/e' lateral: 4.4 LV SV:         61 LV SV Index:   31 LVOT Area:     2.84 cm  RIGHT VENTRICLE             IVC RV Basal diam:  3.30 cm     IVC diam: 1.80 cm RV S prime:     17.70 cm/s TAPSE (M-mode): 2.7 cm LEFT ATRIUM             Index        RIGHT ATRIUM  Index LA diam:        3.60 cm 1.84 cm/m   RA Area:     14.80 cm LA Vol (A2C):   41.8 ml 21.34 ml/m  RA Volume:   34.70 ml  17.72 ml/m LA Vol (A4C):   34.4 ml 17.56 ml/m LA Biplane Vol: 38.2 ml 19.50 ml/m  AORTIC VALVE AV Area (Vmax):    2.21 cm AV Area (Vmean):   2.22 cm AV Area (VTI):     2.43 cm AV Vmax:           158.00  cm/s AV Vmean:          105.000 cm/s AV VTI:            0.251 m AV Peak Grad:      10.0 mmHg AV Mean Grad:      5.0 mmHg LVOT Vmax:         123.00 cm/s LVOT Vmean:        82.300 cm/s LVOT VTI:          0.215 m LVOT/AV VTI ratio: 0.86  AORTA Ao Root diam: 3.20 cm Ao Asc diam:  2.80 cm MITRAL VALVE                TRICUSPID VALVE MV Area (PHT): 3.83 cm     TR Peak grad:   37.0 mmHg MV Area VTI:   2.29 cm     TR Vmax:        304.00 cm/s MV Peak grad:  5.3 mmHg MV Mean grad:  3.0 mmHg     SHUNTS MV Vmax:       1.15 m/s     Systemic VTI:  0.22 m MV Vmean:      80.5 cm/s    Systemic Diam: 1.90 cm MV Decel Time: 198 msec MV E velocity: 82.30 cm/s MV A velocity: 101.00 cm/s MV E/A ratio:  0.81 Arvilla Meres MD Electronically signed by Arvilla Meres MD Signature Date/Time: 01/24/2021/3:24:36 PM    Final    IR PERCUTANEOUS ART THROMBECTOMY/INFUSION INTRACRANIAL INC DIAG ANGIO  Result Date: 01/28/2021 INDICATION: New onset global aphasia and right-sided hemiplegia. 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 EXAM: 1. EMERGENT LARGE VESSEL OCCLUSION THROMBOLYSIS (anterior CIRCULATION) COMPARISON:  CT angiogram of the head and neck of January 23, 2021. MEDICATIONS: Ancef 2 g IV antibiotic was administered within 1 hour of the procedure. ANESTHESIA/SEDATION: General anesthesia. CONTRAST:  Omnipaque 300 approximately 65 mL. FLUOROSCOPY TIME:  Fluoroscopy Time: 15 minutes 42 seconds (792.8 mGy). COMPLICATIONS: None immediate. TECHNIQUE: Following a full explanation of the procedure along with the potential associated complications, an informed witnessed consent was obtained. The risks of intracranial hemorrhage of 10%, worsening neurological deficit, ventilator dependency, death and inability to revascularize were all reviewed in detail with the patient's spouse. The patient was then put under general anesthesia by the Department of Anesthesiology at  [facility]. The right groin was prepped and draped in the usual sterile fashion. Thereafter using modified Seldinger technique, transfemoral access into the right common femoral artery was obtained without difficulty. Over an 0.035 inch guidewire an 8 French Pinnacle sheath was inserted. Through this, and also over an 0.035 inch guidewire a 5 [place] JB 1 catheter was advanced to the aortic arch region and selectively positioned in the left common carotid artery. FINDINGS: The left common carotid arteriogram demonstrates the left external carotid artery and its major branches  to be widely patent. The left internal carotid artery at the bulb to the cranial skull base is widely patent. The petrous, the cavernous and the supraclinoid segments are widely patent. A left posterior communicating artery is seen opacifying the left posterior cerebral artery distribution. Left anterior cerebral artery opacifies into the capillary and venous phases. The left middle cerebral artery demonstrates an early bifurcation with wide patency of the superior division. The inferior division demonstrates a proximal complete occlusion with a large area of hypoperfusion of the left parietal cortical and subcortical area. PROCEDURE: The diagnostic JB 1 catheter in the left common carotid artery was then exchanged over an 0.035 inch 300 cm Rosen exchange guidewire for an 087 95 cm balloon guide catheter which had been prepped with 50% contrast and 50% heparinized saline infusion. The guide catheter was advanced to the distal 1/3 of the left internal carotid artery. The guidewire was removed. Good aspiration obtained from the hub of the balloon guide catheter. A gentle control arteriogram performed through the balloon guide catheter demonstrated no change in the extracranial or intracranial circulations. Over an 0.014 inch standard Synchro micro guidewire with a moderate J configuration, an 021 160 microcatheter inside of an 055 132 cm  Zoom aspiration catheter combination was advanced to the supraclinoid left ICA. Using a torque device, the micro guidewire was then gently advanced without any difficulty into the M3 region of the inferior division of the left middle cerebral artery followed by the microcatheter. The guidewire was removed. Good aspiration obtained from the hub of the microcatheter. Gentle contrast injection through the microcatheter demonstrated safe positioning of the tip of the microcatheter, which was connected to continuous heparinized saline infusion. At this time the Zoom aspiration catheter was advanced and positioned just proximal to the occluded left MCA inferior division. A 3 mm x 40 mm Solitaire X retrieval device was then advanced to the distal end of the microcatheter. The O ring on the delivery microcatheter was loosened. With slight forward gentle traction with the right hand on the delivery micro guidewire with left hand the retrieval device was then advanced. With constant aspiration applied the hub of the balloon guide catheter proximally with proximal flow arrest, and with a Penumbra aspiration device through the Zoom catheter for approximately 3 minutes, the combination of the retrieval device, the microcatheter and the Zoom aspiration catheter were retrieved and removed. Long segment of clot was seen entangled in the retrieval device. Following reversal of flow arrest, a control arteriogram performed through the balloon guide catheter in the left internal carotid artery now demonstrated complete revascularization of the inferior division. A TICI 3 MCA revascularization was achieved. The left posterior communicating artery and the left anterior cerebral artery remained widely patent. Moderate spasm in the proximal left inferior division responded to 3 aliquots of 25 mcg of nitroglycerin. A final control arteriogram performed through the balloon guide in the left common carotid artery continued to demonstrate wide  patency of the left internal carotid extra cranially and intracranially. No evidence of intraluminal filling defects or of occlusion was noted. Balloon guide was removed. The 8 French Pinnacle sheath was removed with hemostasis in the right groin obtained with a 6 French Angio-Seal closure device, and manual compression with a quick clot for 15 minutes. Distal pulses remained palpable in all feet unchanged. A CT of the brain obtained demonstrated no evidence of mass effect or midline shift. There was mild hyperattenuation in the subarachnoid space in the left perisylvian area. Trial of  extubation was unsuccessful as the patient was unresponsive. Patient was, therefore, left intubated and transferred to the neuro ICU for post revascularization care. IMPRESSION: Status post endovascular complete revascularization of occluded inferior division of the left middle cerebral artery with 1 pass with a 3 mm x 40 mm Solitaire X retrieval device, and contact aspiration achieving a TICI 3 revascularization. PLAN: Follow-up as per referring MD. Electronically Signed   By: Julieanne Cotton M.D.   On: 01/28/2021 08:42   CT HEAD CODE STROKE WO CONTRAST  Result Date: 01/23/2021 CLINICAL DATA:  Code stroke. Neuro deficit, acute, stroke suspected. EXAM: CT HEAD WITHOUT CONTRAST TECHNIQUE: Contiguous axial images were obtained from the base of the skull through the vertex without intravenous contrast. COMPARISON:  None. FINDINGS: Brain: No abnormality of the brainstem or cerebellum. Equivocal loss of gray-white differentiation in the left temporal lobe. No other brain parenchymal finding. Vascular: Apparent hyperdense left M1 suggest embolic disease. Skull: Normal Sinuses/Orbits: Clear/normal Other: None ASPECTS ([place] Stroke Program Early CT Score) - Ganglionic level infarction (caudate, lentiform nuclei, internal capsule, insula, M1-M3 cortex): 6 - Supraganglionic infarction (M4-M6 cortex): 3 Total score (0-10 with 10 being  normal): 9 IMPRESSION: 1. Question loss of gray-white differentiation in the left temporal lobe. Otherwise normal appearance of the brain parenchyma. Apparent hyperdense left M1 suggesting embolic disease. 2. ASPECTS is 9 3. These results were communicated to Dr. Thomasena Edis at 3:19 pm on 01/23/2021 by text page via the [facility] messaging system. Electronically Signed   By: Paulina Fusi M.D.   On: 01/23/2021 15:19   VAS [place] LOWER EXTREMITY VENOUS (DVT)  Result Date: 01/24/2021  Lower Venous DVT Study Patient Name:  [name]  Date of Exam:   01/24/2021 Medical Rec #: [id number]        Accession #:    [id number] Date of Birth: 1970-06-22       Patient Gender: F Patient Age:   10 years Exam Location:  [facility] Procedure:      VAS [place] LOWER EXTREMITY VENOUS (DVT) Referring Phys: Scheryl Marten Fionna Merriott --------------------------------------------------------------------------------  Indications: Stroke.  Risk Factors: None identified. Limitations: Poor ultrasound/tissue interface and bandages. Comparison Study: No prior studies. Performing Technologist: Chanda Busing RVT  Examination Guidelines: A complete evaluation includes B-mode imaging, spectral Doppler, color Doppler, and power Doppler as needed of all accessible portions of each vessel. Bilateral testing is considered an integral part of a complete examination. Limited examinations for reoccurring indications may be performed as noted. The reflux portion of the exam is performed with the patient in reverse Trendelenburg.  +---------+---------------+---------+-----------+----------+--------------+  RIGHT     Compressibility Phasicity Spontaneity Properties Thrombus Aging  +---------+---------------+---------+-----------+----------+--------------+  CFV       Full            Yes       Yes                                    +---------+---------------+---------+-----------+----------+--------------+  SFJ       Full                                                              +---------+---------------+---------+-----------+----------+--------------+  FV Prox   Full                                                             +---------+---------------+---------+-----------+----------+--------------+  FV Mid    Full                                                             +---------+---------------+---------+-----------+----------+--------------+  FV Distal Full                                                             +---------+---------------+---------+-----------+----------+--------------+  PFV       Full                                                             +---------+---------------+---------+-----------+----------+--------------+  POP       Partial         Yes       Yes                    Acute           +---------+---------------+---------+-----------+----------+--------------+  PTV       Full                                                             +---------+---------------+---------+-----------+----------+--------------+  PERO      Full                                                             +---------+---------------+---------+-----------+----------+--------------+  Soleal    None                                             Acute           +---------+---------------+---------+-----------+----------+--------------+  Gastroc   None                                             Acute           +---------+---------------+---------+-----------+----------+--------------+   +---------+---------------+---------+-----------+----------+--------------+  LEFT      Compressibility Phasicity Spontaneity Properties Thrombus Aging  +---------+---------------+---------+-----------+----------+--------------+  CFV       Full            Yes       Yes                                    +---------+---------------+---------+-----------+----------+--------------+  SFJ       Full                                                              +---------+---------------+---------+-----------+----------+--------------+  FV Prox   Full                                                             +---------+---------------+---------+-----------+----------+--------------+  FV Mid    Full                                                             +---------+---------------+---------+-----------+----------+--------------+  FV Distal Full                                                             +---------+---------------+---------+-----------+----------+--------------+  PFV       Full                                                             +---------+---------------+---------+-----------+----------+--------------+  POP       Full            Yes       Yes                                    +---------+---------------+---------+-----------+----------+--------------+  PTV       Full                                                             +---------+---------------+---------+-----------+----------+--------------+  PERO      Full                                                             +---------+---------------+---------+-----------+----------+--------------+  Gastroc   None                                             Acute           +---------+---------------+---------+-----------+----------+--------------+  Summary: RIGHT: - Findings consistent with acute deep vein thrombosis involving the right popliteal vein, right soleal veins, and right gastrocnemius veins. - No cystic structure found in the popliteal fossa.  LEFT: - Findings consistent with acute deep vein thrombosis involving the left gastrocnemius veins. - No cystic structure found in the popliteal fossa.  *See table(s) above for measurements and observations. Electronically signed by Heath Lark on 01/24/2021 at 3:34:06 PM.    Final    CT ANGIO HEAD CODE STROKE  Result Date: 01/23/2021 CLINICAL DATA:  Acute deficit, stroke suspected. Abnormal head CT. Left hemisphere insult. EXAM: CT  ANGIOGRAPHY HEAD AND NECK TECHNIQUE: Multidetector CT imaging of the head and neck was performed using the standard protocol during bolus administration of intravenous contrast. Multiplanar CT image reconstructions and MIPs were obtained to evaluate the vascular anatomy. Carotid stenosis measurements (when applicable) are obtained utilizing NASCET criteria, using the distal internal carotid diameter as the denominator. CONTRAST:  75mL OMNIPAQUE IOHEXOL 350 MG/ML SOLN COMPARISON:  Head CT earlier same day FINDINGS: CTA NECK FINDINGS Aortic arch: Normal Right carotid system: Common carotid artery widely patent to the bifurcation. Calcified plaque at the carotid bifurcation but no stenosis. Cervical ICA is widely patent. Left carotid system: Common carotid artery widely patent to the bifurcation. Calcified plaque at the carotid bifurcation and ICA bulb but no stenosis. Cervical ICA widely patent beyond that. Vertebral arteries: Both vertebral artery origins are widely patent. The left is dominant. Both vertebral arteries are patent through the cervical region to the foramen magnum. Skeleton: Scoliotic curvature which could be positional. No acute finding. Other neck: No soft tissue mass or lymphadenopathy. Upper chest: Negative Review of the MIP images confirms the above findings CTA HEAD FINDINGS Anterior circulation: Both internal carotid arteries are patent through the skull base and siphon region. On the right, the anterior and middle cerebral vessels are patent without large vessel occlusion or stenosis. On the left, the anterior cerebral artery is widely patent. There is an embolus within the M1 segment extending over a length of about 8 mm. This corresponds with the hyperdense vessel on the CT. This results in marked stenosis of the vessel but not complete occlusion. More distal MCA branch vessels do show flow. There is fetal origin of the left PCA which appears widely patent. Posterior circulation: Both  vertebral arteries widely patent to the basilar. No basilar stenosis. Posterior circulation branch vessels are patent. Venous sinuses: Patent and normal. Anatomic variants: None significant. Review of the MIP images confirms the above findings IMPRESSION: Acute embolus to the left M1 segment extending over length of about 8 mm. Small amount of flow within the left MCA does opacify the more distal branch vessels. Atherosclerotic calcification at both carotid bifurcations but without measurable stenosis. Results discussed with Dr. Thomasena Edis during interpretation at 1531 hours. Electronically Signed   By: Paulina Fusi M.D.   On: 01/23/2021 15:36   CT ANGIO NECK CODE STROKE  Result Date: 01/23/2021 CLINICAL DATA:  Acute deficit, stroke suspected. Abnormal head CT. Left hemisphere insult. EXAM: CT ANGIOGRAPHY HEAD AND NECK TECHNIQUE: Multidetector CT imaging of the head and neck was performed using the standard protocol during bolus administration of intravenous contrast. Multiplanar CT image reconstructions and MIPs were obtained to evaluate the vascular anatomy. Carotid stenosis measurements (when applicable) are obtained utilizing NASCET criteria, using the distal internal carotid diameter as the denominator. CONTRAST:  75mL OMNIPAQUE IOHEXOL 350 MG/ML SOLN COMPARISON:  Head CT earlier same day FINDINGS: CTA NECK FINDINGS  Aortic arch: Normal Right carotid system: Common carotid artery widely patent to the bifurcation. Calcified plaque at the carotid bifurcation but no stenosis. Cervical ICA is widely patent. Left carotid system: Common carotid artery widely patent to the bifurcation. Calcified plaque at the carotid bifurcation and ICA bulb but no stenosis. Cervical ICA widely patent beyond that. Vertebral arteries: Both vertebral artery origins are widely patent. The left is dominant. Both vertebral arteries are patent through the cervical region to the foramen magnum. Skeleton: Scoliotic curvature which could be  positional. No acute finding. Other neck: No soft tissue mass or lymphadenopathy. Upper chest: Negative Review of the MIP images confirms the above findings CTA HEAD FINDINGS Anterior circulation: Both internal carotid arteries are patent through the skull base and siphon region. On the right, the anterior and middle cerebral vessels are patent without large vessel occlusion or stenosis. On the left, the anterior cerebral artery is widely patent. There is an embolus within the M1 segment extending over a length of about 8 mm. This corresponds with the hyperdense vessel on the CT. This results in marked stenosis of the vessel but not complete occlusion. More distal MCA branch vessels do show flow. There is fetal origin of the left PCA which appears widely patent. Posterior circulation: Both vertebral arteries widely patent to the basilar. No basilar stenosis. Posterior circulation branch vessels are patent. Venous sinuses: Patent and normal. Anatomic variants: None significant. Review of the MIP images confirms the above findings IMPRESSION: Acute embolus to the left M1 segment extending over length of about 8 mm. Small amount of flow within the left MCA does opacify the more distal branch vessels. Atherosclerotic calcification at both carotid bifurcations but without measurable stenosis. Results discussed with Dr. Thomasena Edis during interpretation at 1531 hours. Electronically Signed   By: Paulina Fusi M.D.   On: 01/23/2021 15:36      HISTORY OF PRESENT ILLNESS Ms. DESIRAYE ROLFSON is a 50 y.o. female with history of HTN, DM and HLD presenting with acute onset of aphasia and right sided weakness. She was found to have a left MCA stroke. TNK was given, and patient underwent successful thrombectomy with TICI 3 flow achieved. TEE showed small PFO with bilateral shunting. IVC filter placed. Heparin discontinued due to hemorrhagic transformation of MCA stroke.   HOSPITAL COURSE Stroke:  left MCA scattered infarct s/p  TNK, likely secondary b/l DVTs in the setting of PFO. Hypercoagulable work up underway CT head Hyperdense left M1 suggesting embolic disease ASPECTS 9   CTA head & neck acute embolus of left M1 segment IR M2 occlusions with TICI3 revascularization Post IR CT mild left perinsular subarachnoid hyperattenuation MRI Acute left MCA territory infarcts within the left basal ganglia, anterior limb of left internal capsule, left insula/subinsular white matter as well as superior left temporal cortex CT Head- 12/28- IPH in the left temporal lobe. Mild edema with narrowing the left lateral ventricle, not significantly changed from the prior CT. SAH surrounding the IPH, extending to the left frontal and temporal lobes, additional SAH in the left posterior frontal lobe and parietal lobe CT head- 12/30- no progression of left insular hemorrhage or infarct LE venous duplex- positive for acute DVT in left gastrocnemius veins, acute DVT in right popliteal vein, right soleal veins, and right gastrocnemius vein 2D Echo Bubble study positive for PFO, EF 65-70%, grade I diastolic dysfunction.  TEE showed small PFO with b/l shunting LDL 73 HgbA1c 11.1 UDS negative Hypercoagulable workup so far negative, some are still  pending- follow up with hematology outpatient VTE prophylaxis - heparin drip No antithrombotic prior to admission, no antithrombotic at discharge due to hemorrhagic conversion. Follow up in clinic regarding the timing to resume anticoagulation Therapy recommendations:  outpt PT/OT Disposition:  Home   Hemorrhagic conversion  Headache for 2 days after starting Keppra IV CT repeated 12/28 showed hemorrhagic transformation at left frontal infarct side, no significant midline shift Neuro stable Heparin IV discontinued IVC filter placed CT repeat no progression of left insular hemorrhage or infarct   Headaches due to hemorrhagic transformation Should resolve as hematoma reabsorbs in the brain Repeat  CT showed no progression of left insular hemorrhage or infarct 5 days of foricet as needed at discharge. Minimize usage to avoid rebound headaches  Bilateral DVTs LE venous duplex- positive for acute DVT in left gastrocnemius veins, acute DVT in right popliteal vein, right soleal veins, and right gastrocnemius vein No obvious provoking factors Family history of clotting disorder - "pt grandpa had stroke at age 51, mom has afib on AC. Aunt had 4 DVTs and on eliquis, another aunt had stroke in young age and pt sister died of PHTN" Discontinue heparin due to hemorrhagic conversion IVC filter placed Hypercoagulable work up so far neg but some are pending Will refer to hematology as outpt   PFO 2D Echo bubble study positive for PFO TEE showed small PFO with b/l shunting Will discuss with Dr. Excell Seltzer in the future to see if she could be a PFO closure candidate in the future   Hypertension Home meds:  lisinopril-HCTZ 1-/12.5 Stable, Cleviprex d/c'd Restart home lisiniopril and HCTZ BP goal less than 140 given hemorrhagic information Long-term BP goal normotensive   Hyperlipidemia Home meds:  atorvastatin 20 mg daily LDL 73, goal < 70 Increase atorvastatin to 40 mg daily  Continue statin at discharge   Diabetes type II Uncontrolled Home meds:  metformin 500 mg daily HgbA1c 11.1, goal < 7.0 CBGs Diabetes coordinator consult SSI Close PCP follow-up for better DM control   Other Stroke Risk Factors Obesity, Body mass index is 30.74 kg/m., BMI >/= 30 associated with increased stroke risk, recommend weight loss, diet and exercise as appropriate  Family hx stroke (in multiple family members at young ages) Family history of young stroke in aunt and grandpa   Other Active Problems Hypokalemia- resolved 3.4-> 3.9->3.3->4.5-> 3.6   DISCHARGE EXAM Blood pressure 119/73, pulse 78, temperature 97.9 F (36.6 C), temperature source Oral, resp. rate 18, height 5\' 6"  (1.676 m), weight 86.4  kg, SpO2 95 %. PHYSICAL EXAM General:  Alert, well-developed female in no acute distress  NEURO:  Mental Status: AA&Ox3  Speech/Language: speech is without aphasia.  Naming, fluency, and comprehension intact.   Cranial Nerves:  II: PERRL. Visual fields full.  III, IV, VI: EOMI. Eyelids elevate symmetrically.  V: Sensation is intact to light touch and symmetrical to face.  VII: Smile is symmetrical. Slight right nasolabial fold flattening. VIII: hearing intact to voice. IX, X:  Phonation is normal.  XII: tongue is midline without fasciculations. Motor: 5/5 strength to all muscle groups tested.  Sensation- Intact to light touch bilaterally. Extinction absent to light touch to DSS.  Coordination: FTN intact bilaterally, .No drift.  Gait- deferred  DISCHARGE PLAN Disposition:  Home IVC filter placed Ongoing stroke risk factor control by Primary Care Physician at time of discharge Follow up with PCP- Dr  within 2 weeks of discharge Follow-up Hematology in 2 weeks. Follow up with Cardiology, Dr  Cooper  Follow-up in [facility] in 4 weeks, office to schedule an appointment.   40 minutes were spent preparing discharge.  Patient seen and examined by NP/APP with MD. MD to update note as needed.   Elmer Picker, DNP, FNP-BC [facility] Pager: [phone]  ATTENDING NOTE: I reviewed above note and agree with the assessment and plan.   No acute event overnight.  Patient still complains of mild to moderate headache this morning, improved after Fioricet.  CT repeat stable, hemorrhagic transformation evolution as expected.  Patient can be discharged from neuro standpoint at this time.  We will set up hematology, cardiology and neurology follow-up.  We will discussed with Dr. Excell Seltzer regarding potential PFO closure if needed.  For detailed assessment and plan, please refer to above as I have made changes wherever appropriate.    Marvel Plan, MD PhD Stroke Neurology 01/29/2021 6:04 PM

## 2021-01-31 ENCOUNTER — Encounter (HOSPITAL_COMMUNITY): Payer: Self-pay | Admitting: Internal Medicine

## 2021-01-31 LAB — PHOSPHATIDYLSERINE ANTIBODIES
Phosphatydalserine, IgA: 1 APS Units (ref 0–19)
Phosphatydalserine, IgG: 9 Units (ref 0–30)
Phosphatydalserine, IgM: <10 Units (ref 0–30)

## 2021-02-02 ENCOUNTER — Encounter: Payer: Self-pay | Admitting: Rehabilitation

## 2021-02-02 ENCOUNTER — Other Ambulatory Visit: Payer: Self-pay

## 2021-02-02 ENCOUNTER — Ambulatory Visit: Payer: Federal, State, Local not specified - PPO | Attending: Neurology | Admitting: Rehabilitation

## 2021-02-02 ENCOUNTER — Telehealth: Payer: Self-pay | Admitting: Hematology

## 2021-02-02 DIAGNOSIS — R2681 Unsteadiness on feet: Secondary | ICD-10-CM

## 2021-02-02 DIAGNOSIS — I639 Cerebral infarction, unspecified: Secondary | ICD-10-CM | POA: Insufficient documentation

## 2021-02-02 DIAGNOSIS — M6281 Muscle weakness (generalized): Secondary | ICD-10-CM

## 2021-02-02 DIAGNOSIS — R2689 Other abnormalities of gait and mobility: Secondary | ICD-10-CM | POA: Diagnosis not present

## 2021-02-02 LAB — FACTOR 5 LEIDEN

## 2021-02-02 NOTE — Telephone Encounter (Signed)
Scheduled appt per 12/29 referral. Pt's husband is aware of appt date and time. He is aware pt needs to be here 15 mins prior to appt time as well.

## 2021-02-02 NOTE — Patient Instructions (Signed)
Access Code: [email or web address] URL: [email or web address] Date: 02/02/2021 Prepared by: Harriet Butte  Exercises Supine Figure 4 Piriformis Stretch - 1 x daily - 7 x weekly - 1 sets - 2-3 reps - 30 secs hold Supine ITB Stretch with Strap - 1 x daily - 7 x weekly - 1 sets - 2 reps - 30 secs hold Standing ITB Stretch - 1 x daily - 7 x weekly - 1 sets - 2 reps - 30 secs hold Seated Hamstring Stretch - 1 x daily - 7 x weekly - 1 sets - 2-3 reps - 30 secs hold

## 2021-02-02 NOTE — Therapy (Signed)
[facility] [address] [place], [place], [address] Phone: [phone]   Fax:  [phone]  Physical Therapy Treatment  Patient Details  Name: [name] MRN: [id number] Date of Birth: December 27, 1970 Referring Provider (PT): Rosalin Hawking, MD   Encounter Date: 02/02/2021   PT End of Session - 02/02/21 1249     Visit Number 1    Number of Visits 17    Date for PT Re-Evaluation 04/03/21    Authorization Type BCBS    Authorization - Visit Number 1    Authorization - Number of Visits 50    Equipment Utilized During Treatment Gait belt    Activity Tolerance Patient tolerated treatment well    Behavior During Therapy [facility] for tasks assessed/performed             History reviewed. No pertinent past medical history.  Past Surgical History:  Procedure Laterality Date   BUBBLE STUDY  01/28/2021   Procedure: BUBBLE STUDY;  Surgeon: Pixie Casino, MD;  Location: [place];  Service: Cardiovascular;;   IR CT HEAD LTD  01/23/2021   IR IVC FILTER PLMT / S&I Burke Keels GUID/MOD SED  01/28/2021   IR PERCUTANEOUS ART THROMBECTOMY/INFUSION INTRACRANIAL INC DIAG ANGIO  01/23/2021   RADIOLOGY WITH ANESTHESIA N/A 01/23/2021   Procedure: IR WITH ANESTHESIA;  Surgeon: Radiologist, Medication, MD;  Location: [place];  Service: Radiology;  Laterality: N/A;   TEE WITHOUT CARDIOVERSION N/A 01/28/2021   Procedure: TRANSESOPHAGEAL ECHOCARDIOGRAM (TEE);  Surgeon: Pixie Casino, MD;  Location: [facility] ENDOSCOPY;  Service: Cardiovascular;  Laterality: N/A;    There were no vitals filed for this visit.   Subjective Assessment - 02/02/21 1154     Subjective "I had a stroke, they put me on heparin and then I had a brain bleed and then I had the coiling procedure.  I feel like I'm moving slowly but my hips are also hurting and I'm not sure what that's from.  I also am having some balance issues, but other than that I'm doing well."    Pertinent  History HTN, DM II, HLD    How long can you stand comfortably? 20 mins    How long can you walk comfortably? 20 mins    Patient Stated Goals Get back to work    Currently in Pain? Yes    Pain Score 8     Pain Location Hip    Pain Orientation Right;Left    Pain Descriptors / Indicators Aching;Tightness    Pain Type Acute pain    Pain Onset 1 to 4 weeks ago    Pain Frequency Intermittent    Aggravating Factors  Getting up or down, laying down at night    Pain Relieving Factors nothing                [facility] PT Assessment - 02/02/21 1158       Assessment   Medical Diagnosis CVA, brain bleed    Referring Provider (PT) Rosalin Hawking, MD    Onset Date/Surgical Date 01/23/21    Prior Therapy acute PT      Precautions   Precautions Fall      Restrictions   Weight Bearing Restrictions No      Balance Screen   Has the patient fallen in the past 6 months No      [place] residence    Living Arrangements Spouse/significant other    Available Help at Discharge  Family;Available 24 hours/day    Type of Home House    Home Access Stairs to enter    Entrance Stairs-Number of Steps 1    Home Layout Two level;Able to live on main level with bedroom/bathroom    Alternate Level Stairs-Number of Steps 14    Alternate Level Stairs-Rails Right    Home Equipment None      Prior Function   Level of Independence Independent    Vocation Full time employment    Engineer, 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    Leisure Spend time with grandkids (6, 4, 1)      Cognition   Overall Cognitive Status Within Functional Limits for tasks assessed      Observation/Other Assessments   Focus on Therapeutic Outcomes (FOTO)  Patient's Physical FS Primary Measure 65      Sensation   Light Touch Appears Intact    Proprioception Appears Intact      Coordination   Gross Motor Movements are Fluid and Coordinated Yes    Fine Motor Movements are Fluid and  Coordinated Yes    Heel Shin Test WFL      ROM / Strength   AROM / PROM / Strength Strength;AROM      AROM   Overall AROM  Deficits    Overall AROM Comments Note marked tightness in B hamstrings L>R.  Unable to get 0 deg extension with ankle DF, lacking approx 15-20 deg      Strength   Overall Strength Deficits    Overall Strength Comments Grossly 4/5.  Some weakness noted in B hip flex (seated) and R knee flex 3+/5.      Palpation   Palpation comment Pt reporting pain in B hips, esp with transitions better with movement.  Note that upon R and L SL positions demos marked tightness in IT band and tenderness over greater trochanter.  Provided stretching to address this.  See pt instructions.      Transfers   Transfers Sit to Stand;Stand to Sit    Sit to Stand 6: Modified independent (Device/Increase time)    Five time sit to stand comments  15.66 secs with hands on lap from standard arm chair    Stand to Sit 6: Modified independent (Device/Increase time)      Ambulation/Gait   Ambulation/Gait Yes    Ambulation/Gait Assistance 5: Supervision    Ambulation/Gait Assistance Details Mod I with slow gait speed, but S during higher level balance challenges for safety. Pt with antalgic gait pattern at times esp in L hip.    Ambulation Distance (Feet) 150 Feet    Assistive device None    Gait Pattern Step-through pattern;Decreased arm swing - right;Decreased arm swing - left;Decreased stride length;Right flexed knee in stance;Left flexed knee in stance;Antalgic    Ambulation Surface Level;Indoor    Gait velocity 2.43 ft/sec without device    Stairs Yes    Stairs Assistance 5: Supervision    Stair Management Technique One rail Right;Alternating pattern;Forwards    Number of Stairs 4    Height of Stairs 6      Functional Gait  Assessment   Gait assessed  Yes    Gait Level Surface Walks 20 ft, slow speed, abnormal gait pattern, evidence for imbalance or deviates 10-15 in outside of the 12  in walkway width. Requires more than 7 sec to ambulate 20 ft.   8.13 secs   Change in Gait Speed Able to change speed, demonstrates mild gait  deviations, deviates 6-10 in outside of the 12 in walkway width, or no gait deviations, unable to achieve a major change in velocity, or uses a change in velocity, or uses an assistive device.    Gait with Horizontal Head Turns Performs head turns smoothly with no change in gait. Deviates no more than 6 in outside 12 in walkway width    Gait with Vertical Head Turns Performs head turns with no change in gait. Deviates no more than 6 in outside 12 in walkway width.   with her baseline gait speed, no deviations   Gait and Pivot Turn Pivot turns safely within 3 sec and stops quickly with no loss of balance.    Step Over Obstacle Is able to step over one shoe box (4.5 in total height) but must slow down and adjust steps to clear box safely. May require verbal cueing.    Gait with Narrow Base of Support Is able to ambulate for 10 steps heel to toe with no staggering.    Gait with Eyes Closed Walks 20 ft, slow speed, abnormal gait pattern, evidence for imbalance, deviates 10-15 in outside 12 in walkway width. Requires more than 9 sec to ambulate 20 ft.    Ambulating Backwards Walks 20 ft, uses assistive device, slower speed, mild gait deviations, deviates 6-10 in outside 12 in walkway width.    Steps Alternating feet, must use rail.    Total Score 21    FGA comment: 19-24 = medium risk fall                    Signed   Access Code: [email or web address] URL: [email or web address] Date: 02/02/2021 Prepared by: Harriet Butte   Exercises Supine Figure 4 Piriformis Stretch - 1 x daily - 7 x weekly - 1 sets - 2-3 reps - 30 secs hold Supine ITB Stretch with Strap - 1 x daily - 7 x weekly - 1 sets - 2 reps - 30 secs hold Standing ITB Stretch - 1 x daily - 7 x weekly - 1 sets - 2 reps - 30 secs hold Seated Hamstring Stretch - 1 x daily - 7 x weekly - 1  sets - 2-3 reps - 30 secs hold                         PT Education - 02/02/21 1249     Education Details POC, evaluation results, goals, HEP    Person(s) Educated Patient    Methods Explanation;Demonstration;Handout    Comprehension Verbalized understanding;Returned demonstration              PT Short Term Goals - 02/02/21 1255       PT SHORT TERM GOAL #1   Title Pt will initiate HEP in order to indicate improved functional mobility and balance (Target Date: 03/04/21)    Time 4    Period Weeks    Status New    Target Date 03/04/21      PT SHORT TERM GOAL #2   Title Pt will perform 5TSS in </=11.5 secs without UE support in order to indicate improved functional LE strength.    Time 4    Period Weeks    Status New      PT SHORT TERM GOAL #3   Title Pt will improve FGA to >/=25/30 in order to indicate dec fall risk.    Time 4    Period Weeks    Status New  PT SHORT TERM GOAL #4   Title Pt will improve gait speed to >/=3.0 ft/sec in order to indicate improved community ambulation.    Time 4    Period Weeks    Status New      PT SHORT TERM GOAL #5   Title Pt will demonstrate improved hamstring and hip flexibility and therefore state no more than 4/10 pain with transitional movements.    Time 4    Period Weeks    Status New               PT Long Term Goals - 02/02/21 1258       PT LONG TERM GOAL #1   Title Pt will be IND with final HEP and walking program in order to indicate improved functional mobility and dec fall risk. (Target Date: 04/03/21)    Time 8    Period Weeks    Status New    Target Date 04/03/21      PT LONG TERM GOAL #2   Title Pt will improve FGA to >/=28/30 in order to indicate dec fall risk and safe return to work.    Time 8    Period Weeks    Status New      PT LONG TERM GOAL #3   Title Pt will ambulate x 1000' over varying outdoor surfaces at independent level in order to indicate safe return to community and  leisure activities.    Time 8    Period Weeks    Status New      PT LONG TERM GOAL #4   Title Pt will demonstrate ability to perform transitional movements (floor to seating surface, sit<>stand, supine<>sit) with no more than 2/10 pain in order to indicate improved flexibility.    Time 8    Period Weeks    Status New      PT LONG TERM GOAL #5   Title Patient will improve Physical FS Primary Measure (in FOTO) to 77 in order to indicate improved self reported function.    Time 8    Period Weeks    Status New                   Plan - 02/02/21 1250     Clinical Impression Statement Pt is 51 yo female who presents with acute onset aphasia,R facial droop,  R sided weakness, L gaze preference. Pt given TNK and had emergent thrombectomy of L M1 thrombus. After procedure pt found to have petechial hemorrhage and small L SAH. Pt also with B LE DVT's. Bubble study pos for PFO.  Past medical history of HTN, DM II, HLD.  Upon PT evaluation, note B hip pain which seems to be due to marked ITB tightness, hamstring tightness and tenderness over greater trochanters.  Gait speed of 2.43 ft/sec which is slightly slower than safe community speeds and less than normal gait speed, FGA score of 21/30 indicative of medium fall risk, and 5TSS of 15.66 secs with hands on lap indicative of dec functional strength.  Pt will benefit from skilled OP neuro in order to address deficits.    Personal Factors and Comorbidities Age;Comorbidity 3+    Comorbidities see above    Examination-Activity Limitations Locomotion Level;Sit;Squat;Stairs;Stand;Transfers    Examination-Participation Restrictions Community Activity;Driving;Occupation;Shop    Stability/Clinical Decision Making Evolving/Moderate complexity    Clinical Decision Making Moderate    Rehab Potential Good    PT Frequency 2x / week    PT Duration 8  weeks   may be able to D/c before this   PT Treatment/Interventions ADLs/Self Care Home  Management;Aquatic Therapy;Gait training;Stair training;Functional mobility training;Therapeutic activities;Therapeutic exercise;Balance training;Neuromuscular re-education;Patient/family education;Passive range of motion;Vestibular    PT Next Visit Plan How are exercises going, hip pain better? Add strengthening and balance, work on improving gait speed, high level balance, compliant surfaces, EC    Consulted and Agree with Plan of Care Patient             Patient will benefit from skilled therapeutic intervention in order to improve the following deficits and impairments:  Abnormal gait, Decreased activity tolerance, Decreased balance, Decreased endurance, Decreased mobility, Decreased range of motion, Decreased strength, Difficulty walking, Impaired perceived functional ability, Impaired flexibility, Postural dysfunction  Visit Diagnosis: Unsteadiness on feet  Muscle weakness (generalized)  Other abnormalities of gait and mobility     Problem List Patient Active Problem List   Diagnosis Date Noted   DVT, 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 01/29/2021   PFO (patent foramen ovale) 01/29/2021   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   Stroke ([place]) 01/23/2021   left MCA scattered infarct s/p TNK, likely secondary b/l DVTs in the setting of PFO 01/23/2021   OVERWEIGHT/OBESITY 06/19/2008   VITAMIN B12 DEFICIENCY 05/05/2007   HYPERCHOLESTEROLEMIA 05/05/2007   IRRITABLE BOWEL SYNDROME, HX OF 05/05/2007    Cameron Sprang, PT, MPT [facility] [address] [place], [place], [address] Phone: [phone]   Fax:  [phone] 02/02/21, 1:07 PM   Name: [name] MRN: [id number] Date of Birth: 1970-02-02

## 2021-02-05 DIAGNOSIS — M7062 Trochanteric bursitis, left hip: Secondary | ICD-10-CM | POA: Diagnosis not present

## 2021-02-05 DIAGNOSIS — I679 Cerebrovascular disease, unspecified: Secondary | ICD-10-CM | POA: Diagnosis not present

## 2021-02-05 DIAGNOSIS — I10 Essential (primary) hypertension: Secondary | ICD-10-CM | POA: Diagnosis not present

## 2021-02-05 DIAGNOSIS — Z8673 Personal history of transient ischemic attack (TIA), and cerebral infarction without residual deficits: Secondary | ICD-10-CM | POA: Diagnosis not present

## 2021-02-08 ENCOUNTER — Ambulatory Visit: Payer: Federal, State, Local not specified - PPO | Admitting: Physical Therapy

## 2021-02-08 ENCOUNTER — Encounter: Payer: Self-pay | Admitting: Physical Therapy

## 2021-02-08 ENCOUNTER — Other Ambulatory Visit: Payer: Self-pay

## 2021-02-08 DIAGNOSIS — M6281 Muscle weakness (generalized): Secondary | ICD-10-CM

## 2021-02-08 DIAGNOSIS — R2681 Unsteadiness on feet: Secondary | ICD-10-CM

## 2021-02-08 DIAGNOSIS — R2689 Other abnormalities of gait and mobility: Secondary | ICD-10-CM

## 2021-02-08 DIAGNOSIS — I639 Cerebral infarction, unspecified: Secondary | ICD-10-CM | POA: Diagnosis not present

## 2021-02-08 NOTE — Patient Instructions (Addendum)
Access Code: [email or web address] URL: [email or web address] Date: 02/08/2021 Prepared by: Waldon Merl  Performed stretches after warming up with isometric exercises below. Exercises Supine Figure 4 Piriformis Stretch - 1 x daily - 7 x weekly - 1 sets - 2-3 reps - 30 secs hold Supine ITB Stretch with Strap - 1 x daily - 7 x weekly - 1 sets - 2 reps - 30 secs hold Standing ITB Stretch - 1 x daily - 7 x weekly - 1 sets - 2 reps - 30 secs hold Seated Hamstring Stretch - 1 x daily - 7 x weekly - 1 sets - 2-3 reps - 30 secs hold  Pt performed each exercise below, but pt did not want to add to her current HEP and walking program.  She performed with no increase in hip pain and with cues for technique. Supine Bilateral Hamstring Sets - 1 x daily - 5 x weekly - 1-2 sets - 10 reps - 5 hold Supine Hip Adduction Isometric with Ball - 1 x daily - 5 x weekly - 1-2 sets - 10 reps - 5 hold Hooklying Clamshell with Resistance - 1-2 x daily - 5 x weekly - 1-2 sets - 10 reps - 3 hold Hooklying Isometric Hip Flexion - 1-2 x daily - 5 x weekly - 1-2 sets - 10 reps - 3 hold

## 2021-02-08 NOTE — Therapy (Signed)
[facility] [address] [place], [place], [address] Phone: [phone]   Fax:  [phone]  Physical Therapy Treatment  Patient Details  Name: [name] MRN: [id number] Date of Birth: 1970/11/14 Referring Provider (PT): Marvel Plan, MD   Encounter Date: 02/08/2021   PT End of Session - 02/08/21 1023     Visit Number 2    Number of Visits 17    Date for PT Re-Evaluation 04/03/21    Authorization Type BCBS    Authorization - Visit Number 1    Authorization - Number of Visits 50    PT Start Time 1015    PT Stop Time 1105    PT Time Calculation (min) 50 min    Equipment Utilized During Treatment Gait belt    Activity Tolerance Patient tolerated treatment well    Behavior During Therapy [facility] for tasks assessed/performed             History reviewed. No pertinent past medical history.  Past Surgical History:  Procedure Laterality Date   BUBBLE STUDY  01/28/2021   Procedure: BUBBLE STUDY;  Surgeon: Chrystie Nose, MD;  Location: [facility] ENDOSCOPY;  Service: Cardiovascular;;   IR CT HEAD LTD  01/23/2021   IR IVC FILTER PLMT / S&I Lenise Arena GUID/MOD SED  01/28/2021   IR PERCUTANEOUS ART THROMBECTOMY/INFUSION INTRACRANIAL INC DIAG ANGIO  01/23/2021   RADIOLOGY WITH ANESTHESIA N/A 01/23/2021   Procedure: IR WITH ANESTHESIA;  Surgeon: Radiologist, Medication, MD;  Location: [facility] OR;  Service: Radiology;  Laterality: N/A;   TEE WITHOUT CARDIOVERSION N/A 01/28/2021   Procedure: TRANSESOPHAGEAL ECHOCARDIOGRAM (TEE);  Surgeon: Chrystie Nose, MD;  Location: [facility] ENDOSCOPY;  Service: Cardiovascular;  Laterality: N/A;    There were no vitals filed for this visit.   Subjective Assessment - 02/08/21 1015     Subjective Pt reports going to PCP and was given a cortisone shot in L hip and it has seemed to help. Pain is, " better than it was when I came here last time." Pt reports working on HEP at home.    Patient is accompained  by: Family member    Pertinent History HTN, DM II, HLD    How long can you stand comfortably? 20 mins    How long can you walk comfortably? 20 mins    Patient Stated Goals Get back to work    Currently in Pain? Yes    Pain Score 6     Pain Orientation Right;Left    Pain Descriptors / Indicators Aching;Tightness    Pain Type Acute pain    Pain Onset 1 to 4 weeks ago    Pain Frequency Intermittent                  Performed stretches after warming up with isometric exercises below. Exercises Supine Figure 4 Piriformis Stretch - 1 x daily - 7 x weekly - 1 sets - 2-3 reps - 30 secs hold Supine ITB Stretch with Strap - 1 x daily - 7 x weekly - 1 sets - 2 reps - 30 secs hold Standing ITB Stretch - 1 x daily - 7 x weekly - 1 sets - 2 reps - 30 secs hold Seated Hamstring Stretch - 1 x daily - 7 x weekly - 1 sets - 2-3 reps - 30 secs hold  Pt performed each exercise below, but pt did not want to add to her current HEP and walking program.  She performed with  no increase in hip pain and with cues for technique. Supine Bilateral Hamstring Sets - 1 x daily - 5 x weekly - 1-2 sets - 10 reps - 5 hold Supine Hip Adduction Isometric with Ball - 1 x daily - 5 x weekly - 1-2 sets - 10 reps - 5 hold Hooklying Clamshell with Resistance - 1-2 x daily - 5 x weekly - 1-2 sets - 10 reps - 3 hold Hooklying Isometric Hip Flexion - 1-2 x daily - 5 x weekly - 1-2 sets - 10 reps - 3 hold               [facility] Adult PT Treatment/Exercise - 02/08/21 0001       Knee/Hip Exercises: Supine   Hip Adduction Isometric Strengthening;Both;2 sets;10 reps   hooklying, ball squeezes   Bridges with [employer] Strengthening;Both;2 sets;10 reps    Knee Flexion Strengthening;Both;1 set    Other Supine Knee/Hip Exercises heel presses in hooklying for bil. hamtring strengthening. 10x2 5 sec hold.    Other Supine Knee/Hip Exercises bil. hip abd with green theraband 10x2                     PT  Education - 02/08/21 1027     Education Details HEP review; how to use contract relax method to decrease pain in muscles.    Person(s) Educated Patient    Methods Explanation    Comprehension Verbalized understanding              PT Short Term Goals - 02/02/21 1255       PT SHORT TERM GOAL #1   Title Pt will initiate HEP in order to indicate improved functional mobility and balance (Target Date: 03/04/21)    Time 4    Period Weeks    Status New    Target Date 03/04/21      PT SHORT TERM GOAL #2   Title Pt will perform 5TSS in </=11.5 secs without UE support in order to indicate improved functional LE strength.    Time 4    Period Weeks    Status New      PT SHORT TERM GOAL #3   Title Pt will improve FGA to >/=25/30 in order to indicate dec fall risk.    Time 4    Period Weeks    Status New      PT SHORT TERM GOAL #4   Title Pt will improve gait speed to >/=3.0 ft/sec in order to indicate improved community ambulation.    Time 4    Period Weeks    Status New      PT SHORT TERM GOAL #5   Title Pt will demonstrate improved hamstring and hip flexibility and therefore state no more than 4/10 pain with transitional movements.    Time 4    Period Weeks    Status New               PT Long Term Goals - 02/02/21 1258       PT LONG TERM GOAL #1   Title Pt will be IND with final HEP and walking program in order to indicate improved functional mobility and dec fall risk. (Target Date: 04/03/21)    Time 8    Period Weeks    Status New    Target Date 04/03/21      PT LONG TERM GOAL #2   Title Pt will improve FGA to >/=28/30 in order to  indicate dec fall risk and safe return to work.    Time 8    Period Weeks    Status New      PT LONG TERM GOAL #3   Title Pt will ambulate x 1000' over varying outdoor surfaces at independent level in order to indicate safe return to community and leisure activities.    Time 8    Period Weeks    Status New      PT LONG TERM GOAL  #4   Title Pt will demonstrate ability to perform transitional movements (floor to seating surface, sit<>stand, supine<>sit) with no more than 2/10 pain in order to indicate improved flexibility.    Time 8    Period Weeks    Status New      PT LONG TERM GOAL #5   Title Patient will improve Physical FS Primary Measure (in FOTO) to 77 in order to indicate improved self reported function.    Time 8    Period Weeks    Status New                   Plan - 02/08/21 1028     Clinical Impression Statement Pt reported no increase in pain with isometric hip strengthening warmups and progressed to resisted LE strengthening and LE stretches from HEP. Pt still had some pain with bed mobility and sit<>stand transitions but reported pain dissapating within a few minutes.    Personal Factors and Comorbidities Age;Comorbidity 3+    Comorbidities see above    Examination-Activity Limitations Locomotion Level;Sit;Squat;Stairs;Stand;Transfers    Examination-Participation Restrictions Community Activity;Driving;Occupation;Shop    Stability/Clinical Decision Making Evolving/Moderate complexity    Rehab Potential Good    PT Frequency 2x / week    PT Duration 8 weeks   may be able to D/c before this   PT Treatment/Interventions ADLs/Self Care Home Management;Aquatic Therapy;Gait training;Stair training;Functional mobility training;Therapeutic activities;Therapeutic exercise;Balance training;Neuromuscular re-education;Patient/family education;Passive range of motion;Vestibular    PT Next Visit Plan How are exercises going, hip pain better? Add strengthening and balance, work on improving gait speed, high level balance, compliant surfaces, EC    Consulted and Agree with Plan of Care Patient             Patient will benefit from skilled therapeutic intervention in order to improve the following deficits and impairments:  Abnormal gait, Decreased activity tolerance, Decreased balance, Decreased  endurance, Decreased mobility, Decreased range of motion, Decreased strength, Difficulty walking, Impaired perceived functional ability, Impaired flexibility, Postural dysfunction  Visit Diagnosis: Unsteadiness on feet  Muscle weakness (generalized)  Other abnormalities of gait and mobility     Problem List Patient Active Problem List   Diagnosis Date Noted   DVT, 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 01/29/2021   PFO (patent foramen ovale) 01/29/2021   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   Stroke (HCC) 01/23/2021   left MCA scattered infarct s/p TNK, likely secondary b/l DVTs in the setting of PFO 01/23/2021   OVERWEIGHT/OBESITY 06/19/2008   VITAMIN B12 DEFICIENCY 05/05/2007   HYPERCHOLESTEROLEMIA 05/05/2007   IRRITABLE BOWEL SYNDROME, HX OF 05/05/2007    Hortencia ConradiKarissa Elonzo Sopp, PTA  02/08/21, 1:02 PM   [facility] [address] [place], [place], [address] Phone: [phone]   Fax:  [phone]  Name: [name] MRN: [id number] Date of Birth: 08-03-1970

## 2021-02-10 ENCOUNTER — Other Ambulatory Visit: Payer: Self-pay

## 2021-02-10 ENCOUNTER — Encounter: Payer: Self-pay | Admitting: Physical Therapy

## 2021-02-10 ENCOUNTER — Ambulatory Visit: Payer: Federal, State, Local not specified - PPO | Admitting: Physical Therapy

## 2021-02-10 DIAGNOSIS — R2681 Unsteadiness on feet: Secondary | ICD-10-CM | POA: Diagnosis not present

## 2021-02-10 DIAGNOSIS — R2689 Other abnormalities of gait and mobility: Secondary | ICD-10-CM | POA: Diagnosis not present

## 2021-02-10 DIAGNOSIS — M6281 Muscle weakness (generalized): Secondary | ICD-10-CM | POA: Diagnosis not present

## 2021-02-10 DIAGNOSIS — I639 Cerebral infarction, unspecified: Secondary | ICD-10-CM | POA: Diagnosis not present

## 2021-02-10 NOTE — Therapy (Signed)
[facility] [address] [place], [place], [address] Phone: [phone]   Fax:  [phone]  Physical Therapy Treatment  Patient Details  Name: [name] MRN: [id number] Date of Birth: 04-13-1970 Referring Provider (PT): Marvel Plan, MD   Encounter Date: 02/10/2021   PT End of Session - 02/10/21 0851     Visit Number 3    Number of Visits 17    Date for PT Re-Evaluation 04/03/21    Authorization Type BCBS    Authorization - Visit Number 2    Authorization - Number of Visits 50    PT Start Time [phone]    PT Stop Time 0930    PT Time Calculation (min) 43 min    Equipment Utilized During Treatment Gait belt    Activity Tolerance Patient tolerated treatment well    Behavior During Therapy [facility] for tasks assessed/performed             History reviewed. No pertinent past medical history.  Past Surgical History:  Procedure Laterality Date   BUBBLE STUDY  01/28/2021   Procedure: BUBBLE STUDY;  Surgeon: Chrystie Nose, MD;  Location: [facility] ENDOSCOPY;  Service: Cardiovascular;;   IR CT HEAD LTD  01/23/2021   IR IVC FILTER PLMT / S&I Lenise Arena GUID/MOD SED  01/28/2021   IR PERCUTANEOUS ART THROMBECTOMY/INFUSION INTRACRANIAL INC DIAG ANGIO  01/23/2021   RADIOLOGY WITH ANESTHESIA N/A 01/23/2021   Procedure: IR WITH ANESTHESIA;  Surgeon: Radiologist, Medication, MD;  Location: [facility] OR;  Service: Radiology;  Laterality: N/A;   TEE WITHOUT CARDIOVERSION N/A 01/28/2021   Procedure: TRANSESOPHAGEAL ECHOCARDIOGRAM (TEE);  Surgeon: Chrystie Nose, MD;  Location: [facility] ENDOSCOPY;  Service: Cardiovascular;  Laterality: N/A;    There were no vitals filed for this visit.   Subjective Assessment - 02/10/21 0849     Subjective No new complaints. Stretches are helping hips feel better. Was able to walk for 15 minutes yesterday outside.    Patient is accompained by: Family member   in lobby   Pertinent History HTN, DM II, HLD    How  long can you stand comfortably? 20 mins    How long can you walk comfortably? 20 mins    Patient Stated Goals Get back to work    Currently in Pain? Yes    Pain Score 4     Pain Location Hip    Pain Orientation Right;Left    Pain Descriptors / Indicators Aching;Tightness    Pain Type Acute pain    Pain Onset 1 to 4 weeks ago    Pain Frequency Intermittent    Aggravating Factors  getting up or down, laying down at night    Pain Relieving Factors injection, stretches from HEP                     [address] [facility] Adult PT Treatment/Exercise - 02/10/21 0854       Transfers   Transfers Sit to Stand;Stand to Sit    Sit to Stand 6: Modified independent (Device/Increase time)    Stand to Sit 6: Modified independent (Device/Increase time)      Ambulation/Gait   Ambulation/Gait Yes    Ambulation/Gait Assistance 5: Supervision;6: Modified independent (Device/Increase time)    Ambulation Distance (Feet) --   around clinic with session   Assistive device None    Gait Pattern Step-through pattern;Decreased arm swing - right;Decreased arm swing - left;Decreased stride length;Right flexed knee in stance;Left flexed knee in stance;Antalgic  Ambulation Surface Level;Indoor      Exercises   Exercises Other Exercises    Other Exercises  supine figure 4 stretch for 30 sec's x 3 each side, then seated at edge of mat for hamstring strech for 30 sec's x 3 reps each side.      Knee/Hip Exercises: Aerobic   Nustep UE/LE's level 3 x 8 minutes with goal >/= 50 steps per minute for strengthening and activity tolerance.                 Balance Exercises - 02/10/21 0908       Balance Exercises: Standing   Standing Eyes Closed Foam/compliant surface;Narrow base of support (BOS);Wide (BOA);Head turns;Other reps (comment);30 secs;Limitations    Standing Eyes Closed Limitations on 2 pillows in corner with chair in front for safety: feet together for EC 30 sec's x 3 reps, progressing to feet apart  for EC head movements left<>right, up<>down and diagonals both ways.    Balance Beam standing across blue foam beam: alternating forward stepping to floor/back onto beam, then alternating backward stepping to floor/back onto beam for ~10 reps each/each way with light to no UE support on bars with min guard assist for safety and cues for increased step lenght/height.    Tandem Gait Forward;Retro;Foam/compliant surface;Intermittent upper extremity support;3 reps;Limitations    Tandem Gait Limitations on blue foam beam for 3 laps with light support on bars, cues on posture and step placement    Sidestepping Foam/compliant support;3 reps;Limitations    Sidestepping Limitations on blue foam beam for 3 laps each way with light touch at times on bars, min guard assist for safety with cues on step height/placement                  PT Short Term Goals - 02/02/21 1255       PT SHORT TERM GOAL #1   Title Pt will initiate HEP in order to indicate improved functional mobility and balance (Target Date: 03/04/21)    Time 4    Period Weeks    Status New    Target Date 03/04/21      PT SHORT TERM GOAL #2   Title Pt will perform 5TSS in </=11.5 secs without UE support in order to indicate improved functional LE strength.    Time 4    Period Weeks    Status New      PT SHORT TERM GOAL #3   Title Pt will improve FGA to >/=25/30 in order to indicate dec fall risk.    Time 4    Period Weeks    Status New      PT SHORT TERM GOAL #4   Title Pt will improve gait speed to >/=3.0 ft/sec in order to indicate improved community ambulation.    Time 4    Period Weeks    Status New      PT SHORT TERM GOAL #5   Title Pt will demonstrate improved hamstring and hip flexibility and therefore state no more than 4/10 pain with transitional movements.    Time 4    Period Weeks    Status New               PT Long Term Goals - 02/02/21 1258       PT LONG TERM GOAL #1   Title Pt will be IND with  final HEP and walking program in order to indicate improved functional mobility and dec fall risk. (Target Date: 04/03/21)  Time 8    Period Weeks    Status New    Target Date 04/03/21      PT LONG TERM GOAL #2   Title Pt will improve FGA to >/=28/30 in order to indicate dec fall risk and safe return to work.    Time 8    Period Weeks    Status New      PT LONG TERM GOAL #3   Title Pt will ambulate x 1000' over varying outdoor surfaces at independent level in order to indicate safe return to community and leisure activities.    Time 8    Period Weeks    Status New      PT LONG TERM GOAL #4   Title Pt will demonstrate ability to perform transitional movements (floor to seating surface, sit<>stand, supine<>sit) with no more than 2/10 pain in order to indicate improved flexibility.    Time 8    Period Weeks    Status New      PT LONG TERM GOAL #5   Title Patient will improve Physical FS Primary Measure (in FOTO) to 77 in order to indicate improved self reported function.    Time 8    Period Weeks    Status New                   Plan - 02/10/21 [id number]     Clinical Impression Statement Today's skilled session continued to address strengthening/LE stretching and balance training with no issues or increased pain reported. The pt is making steady progress toward goals and should benefit from continued PT to progress toward unmet goals.    Personal Factors and Comorbidities Age;Comorbidity 3+    Comorbidities see above    Examination-Activity Limitations Locomotion Level;Sit;Squat;Stairs;Stand;Transfers    Examination-Participation Restrictions Community Activity;Driving;Occupation;Shop    Stability/Clinical Decision Making Evolving/Moderate complexity    Rehab Potential Good    PT Frequency 2x / week    PT Duration 8 weeks   may be able to D/c before this   PT Treatment/Interventions ADLs/Self Care Home Management;Aquatic Therapy;Gait training;Stair training;Functional  mobility training;Therapeutic activities;Therapeutic exercise;Balance training;Neuromuscular re-education;Patient/family education;Passive range of motion;Vestibular    PT Next Visit Plan Add strengthening and balance, work on improving gait speed, high level balance, compliant surfaces, EC    Consulted and Agree with Plan of Care Patient             Patient will benefit from skilled therapeutic intervention in order to improve the following deficits and impairments:  Abnormal gait, Decreased activity tolerance, Decreased balance, Decreased endurance, Decreased mobility, Decreased range of motion, Decreased strength, Difficulty walking, Impaired perceived functional ability, Impaired flexibility, Postural dysfunction  Visit Diagnosis: Unsteadiness on feet  Muscle weakness (generalized)  Other abnormalities of gait and mobility     Problem List Patient Active Problem List   Diagnosis Date Noted   DVT, 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 [address] [place], [place] [address] [phone] 02/10/21, 1:48 PM   Name: Trenton GammonRachel B Mottley MRN: [id number] Date of Birth: Mar 14, 1970

## 2021-02-12 ENCOUNTER — Ambulatory Visit: Payer: Federal, State, Local not specified - PPO | Admitting: Internal Medicine

## 2021-02-15 ENCOUNTER — Encounter: Payer: Self-pay | Admitting: Physical Therapy

## 2021-02-15 ENCOUNTER — Other Ambulatory Visit: Payer: Self-pay

## 2021-02-15 ENCOUNTER — Ambulatory Visit: Payer: Federal, State, Local not specified - PPO | Admitting: Physical Therapy

## 2021-02-15 ENCOUNTER — Other Ambulatory Visit: Payer: Self-pay | Admitting: *Deleted

## 2021-02-15 DIAGNOSIS — I639 Cerebral infarction, unspecified: Secondary | ICD-10-CM | POA: Diagnosis not present

## 2021-02-15 DIAGNOSIS — R2689 Other abnormalities of gait and mobility: Secondary | ICD-10-CM | POA: Diagnosis not present

## 2021-02-15 DIAGNOSIS — M6281 Muscle weakness (generalized): Secondary | ICD-10-CM | POA: Diagnosis not present

## 2021-02-15 DIAGNOSIS — R2681 Unsteadiness on feet: Secondary | ICD-10-CM

## 2021-02-15 NOTE — Patient Outreach (Signed)
Received a red flag Emmi stroke notification for [name].  I have assigned Kemper Durie, RN to call for follow up and determine if there are any Case Management needs.    Iverson Alamin, Donivan Scull [facility] Care Management Assistant [facility] Care Management [phone]

## 2021-02-15 NOTE — Therapy (Signed)
[facility] [address] [place], [place], [address] Phone: [phone]   Fax:  [phone]  Physical Therapy Treatment  Patient Details  Name: [name] MRN: [id number] Date of Birth: 19-Dec-1970 Referring Provider (PT): Marvel Plan, MD   Encounter Date: 02/15/2021   PT End of Session - 02/15/21 1110     Visit Number 4    Number of Visits 17    Date for PT Re-Evaluation 04/03/21    Authorization Type BCBS    Authorization - Visit Number 2    Authorization - Number of Visits 50    PT Start Time 1103    PT Stop Time 1145    PT Time Calculation (min) 42 min    Equipment Utilized During Treatment Gait belt    Activity Tolerance Patient tolerated treatment well    Behavior During Therapy [facility] for tasks assessed/performed             History reviewed. No pertinent past medical history.  Past Surgical History:  Procedure Laterality Date   BUBBLE STUDY  01/28/2021   Procedure: BUBBLE STUDY;  Surgeon: Chrystie Nose, MD;  Location: [facility] ENDOSCOPY;  Service: Cardiovascular;;   IR CT HEAD LTD  01/23/2021   IR IVC FILTER PLMT / S&I Lenise Arena GUID/MOD SED  01/28/2021   IR PERCUTANEOUS ART THROMBECTOMY/INFUSION INTRACRANIAL INC DIAG ANGIO  01/23/2021   RADIOLOGY WITH ANESTHESIA N/A 01/23/2021   Procedure: IR WITH ANESTHESIA;  Surgeon: Radiologist, Medication, MD;  Location: [facility] OR;  Service: Radiology;  Laterality: N/A;   TEE WITHOUT CARDIOVERSION N/A 01/28/2021   Procedure: TRANSESOPHAGEAL ECHOCARDIOGRAM (TEE);  Surgeon: Chrystie Nose, MD;  Location: [facility] ENDOSCOPY;  Service: Cardiovascular;  Laterality: N/A;    There were no vitals filed for this visit.   Subjective Assessment - 02/15/21 1104     Subjective Pt walked outside with husband multple times since last visit for exercise. Pt has continued with stretching.    Patient is accompained by: Family member   in lobby   Pertinent History HTN, DM II, HLD    How  long can you stand comfortably? 20 mins    How long can you walk comfortably? 20 mins    Patient Stated Goals Get back to work    Currently in Pain? Yes    Pain Score 3     Pain Location Hip    Pain Orientation Right;Left    Pain Type Acute pain    Pain Onset 1 to 4 weeks ago    Pain Frequency Intermittent                               [facility] Adult PT Treatment/Exercise - 02/15/21 0001       Ambulation/Gait   Ambulation/Gait Yes    Ambulation/Gait Assistance 5: Supervision    Ambulation/Gait Assistance Details working on 1. increasing speed, multiple 10 m walks, 9-8 secs, cues for armswing. 2. curb/ramp negotiaton, practiced different heights on curb, pt demonstrated no imbalance.    Ambulation Distance (Feet) 100 Feet   x4   Assistive device None    Gait Pattern Step-through pattern;Decreased arm swing - right;Right flexed knee in stance;Left flexed knee in stance;Right hip hike    Ambulation Surface Level;Indoor                 Balance Exercises - 02/15/21 0001       Balance Exercises: Standing  Standing Eyes Closed Foam/compliant surface;Narrow base of support (BOS);Wide (BOA);Head turns;Other reps (comment);30 secs;Limitations    Balance Beam standing across blue foam beam: alternating forward stepping to floor/back onto beam, then alternating backward stepping to floor/back onto beam for ~10 reps each/each way with light to no UE support on bars, cues for initial heel strike and appropriate weight shifts.    Tandem Gait Retro;Foam/compliant surface   on blue balance beam litle to  no UE support   Sidestepping Foam/compliant support;3 reps;Limitations    Sidestepping Limitations on blue foam beam for 2 laps each way, no UE support needed    Marching Forwards;Intermittent upper extremity assist   high knee working on control with SLS .                 PT Short Term Goals - 02/02/21 1255       PT SHORT TERM GOAL #1   Title Pt will  initiate HEP in order to indicate improved functional mobility and balance (Target Date: 03/04/21)    Time 4    Period Weeks    Status New    Target Date 03/04/21      PT SHORT TERM GOAL #2   Title Pt will perform 5TSS in </=11.5 secs without UE support in order to indicate improved functional LE strength.    Time 4    Period Weeks    Status New      PT SHORT TERM GOAL #3   Title Pt will improve FGA to >/=25/30 in order to indicate dec fall risk.    Time 4    Period Weeks    Status New      PT SHORT TERM GOAL #4   Title Pt will improve gait speed to >/=3.0 ft/sec in order to indicate improved community ambulation.    Time 4    Period Weeks    Status New      PT SHORT TERM GOAL #5   Title Pt will demonstrate improved hamstring and hip flexibility and therefore state no more than 4/10 pain with transitional movements.    Time 4    Period Weeks    Status New               PT Long Term Goals - 02/02/21 1258       PT LONG TERM GOAL #1   Title Pt will be IND with final HEP and walking program in order to indicate improved functional mobility and dec fall risk. (Target Date: 04/03/21)    Time 8    Period Weeks    Status New    Target Date 04/03/21      PT LONG TERM GOAL #2   Title Pt will improve FGA to >/=28/30 in order to indicate dec fall risk and safe return to work.    Time 8    Period Weeks    Status New      PT LONG TERM GOAL #3   Title Pt will ambulate x 1000' over varying outdoor surfaces at independent level in order to indicate safe return to community and leisure activities.    Time 8    Period Weeks    Status New      PT LONG TERM GOAL #4   Title Pt will demonstrate ability to perform transitional movements (floor to seating surface, sit<>stand, supine<>sit) with no more than 2/10 pain in order to indicate improved flexibility.    Time 8    Period Weeks  Status New      PT LONG TERM GOAL #5   Title Patient will improve Physical FS Primary Measure  (in FOTO) to 77 in order to indicate improved self reported function.    Time 8    Period Weeks    Status New                   Plan - 02/15/21 1124     Clinical Impression Statement Pt is progressing with functional, dynamic standing balance with stepping strategies on compliant surface requiring little to no UE support.  Pt was challenged with standing balance on compliant surface with head movement and eyes closed but needed little to no UE support.    Personal Factors and Comorbidities Age;Comorbidity 3+    Comorbidities see above    Examination-Activity Limitations Locomotion Level;Sit;Squat;Stairs;Stand;Transfers    Examination-Participation Restrictions Community Activity;Driving;Occupation;Shop    Stability/Clinical Decision Making Evolving/Moderate complexity    Rehab Potential Good    PT Frequency 2x / week    PT Duration 8 weeks   may be able to D/c before this   PT Treatment/Interventions ADLs/Self Care Home Management;Aquatic Therapy;Gait training;Stair training;Functional mobility training;Therapeutic activities;Therapeutic exercise;Balance training;Neuromuscular re-education;Patient/family education;Passive range of motion;Vestibular    PT Next Visit Plan 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 Add strengthening and balance, work on improving gait speed, high level balance, compliant surfaces, EC    Consulted and Agree with Plan of Care Patient             Patient will benefit from skilled therapeutic intervention in order to improve the following deficits and impairments:  Abnormal gait, Decreased activity tolerance, Decreased balance, Decreased endurance, Decreased mobility, Decreased range of motion, Decreased strength, Difficulty walking, Impaired perceived functional ability, Impaired flexibility, Postural dysfunction  Visit Diagnosis: Unsteadiness on  feet  Muscle weakness (generalized)  Other abnormalities of gait and mobility     Problem List Patient Active Problem List   Diagnosis Date Noted   DVT, 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 [place], [place], [address] Phone: [phone]   Fax:  [phone]  Name: [name] MRN: [id number] Date of Birth: September 03, 1970

## 2021-02-15 NOTE — Patient Outreach (Signed)
[facility]) Care Management  02/15/2021  CAITLAND PORCHIA [birth year] [phone]   EMMI-STROKE-RESOLVED RED ON EMMI ALERT Day # 13 Date:1/13 Red Alert Reason: Medication issues Outreach attempt # 1 to patient.   RN spoke with pt today and verified credentials. RN able to inquire further n the recent emmi related to medication.   S: Pt states she was able to have a visit with her primary provider and her recent medication (Metformin and Lipitor) are issues resolved. Pt verified good support system in the home with spouse Tasia Catchings) and she is able to get to all her medical appointments. Pt states she has started out-pt therapy and continue to do well with no additional needs at this time. Indicated she just need to finish her FMLA. RN information pt to follow up with her employer office for details on how to confirm the forms and obtain signature ([place]). O: Verified recently discharged from the hospital via stroke diagnosis and discharged home with out-pt therapy. Verified all mediation issues have recently been resolved.  A: Confirmed no ongoing needs or related issues as pt continue to recover well with supportive help in the home. All medications reviewed with no additional  needs. No wounds or further needs at this time. P: Due to pt opting to declined ongoing [facility] follow up calls with no additional needs. Will close this case with no follow needs. Offered to sent [facility] packet if services are needed in the future (receptive).Will also notify the provider of pt's declined for [facility] services at this time with needs.  Elliot Cousin, RN Care Management Coordinator [facility] Main Office [phone]

## 2021-02-16 ENCOUNTER — Inpatient Hospital Stay: Payer: Federal, State, Local not specified - PPO

## 2021-02-16 ENCOUNTER — Inpatient Hospital Stay: Payer: Federal, State, Local not specified - PPO | Attending: Hematology | Admitting: Hematology

## 2021-02-16 VITALS — BP 131/86 | HR 86 | Temp 97.9°F | Resp 18 | Wt 184.8 lb

## 2021-02-16 DIAGNOSIS — R233 Spontaneous ecchymoses: Secondary | ICD-10-CM | POA: Insufficient documentation

## 2021-02-16 DIAGNOSIS — D6859 Other primary thrombophilia: Secondary | ICD-10-CM

## 2021-02-16 DIAGNOSIS — I82431 Acute embolism and thrombosis of right popliteal vein: Secondary | ICD-10-CM | POA: Diagnosis not present

## 2021-02-16 DIAGNOSIS — R609 Edema, unspecified: Secondary | ICD-10-CM | POA: Diagnosis not present

## 2021-02-16 DIAGNOSIS — Z86718 Personal history of other venous thrombosis and embolism: Secondary | ICD-10-CM | POA: Insufficient documentation

## 2021-02-16 DIAGNOSIS — I629 Nontraumatic intracranial hemorrhage, unspecified: Secondary | ICD-10-CM | POA: Insufficient documentation

## 2021-02-16 DIAGNOSIS — I10 Essential (primary) hypertension: Secondary | ICD-10-CM | POA: Insufficient documentation

## 2021-02-16 DIAGNOSIS — I082 Rheumatic disorders of both aortic and tricuspid valves: Secondary | ICD-10-CM | POA: Diagnosis not present

## 2021-02-16 DIAGNOSIS — E785 Hyperlipidemia, unspecified: Secondary | ICD-10-CM | POA: Insufficient documentation

## 2021-02-16 DIAGNOSIS — Z8774 Personal history of (corrected) congenital malformations of heart and circulatory system: Secondary | ICD-10-CM | POA: Insufficient documentation

## 2021-02-16 DIAGNOSIS — Z79899 Other long term (current) drug therapy: Secondary | ICD-10-CM | POA: Insufficient documentation

## 2021-02-16 DIAGNOSIS — Z8673 Personal history of transient ischemic attack (TIA), and cerebral infarction without residual deficits: Secondary | ICD-10-CM | POA: Insufficient documentation

## 2021-02-16 DIAGNOSIS — E538 Deficiency of other specified B group vitamins: Secondary | ICD-10-CM

## 2021-02-16 DIAGNOSIS — Z8679 Personal history of other diseases of the circulatory system: Secondary | ICD-10-CM | POA: Diagnosis not present

## 2021-02-16 DIAGNOSIS — E1136 Type 2 diabetes mellitus with diabetic cataract: Secondary | ICD-10-CM | POA: Insufficient documentation

## 2021-02-16 LAB — CBC WITH DIFFERENTIAL/PLATELET
Abs Immature Granulocytes: 0.04 10*3/uL (ref 0.00–0.07)
Basophils Absolute: 0.1 10*3/uL (ref 0.0–0.1)
Basophils Relative: 1 %
Eosinophils Absolute: 0.1 10*3/uL (ref 0.0–0.5)
Eosinophils Relative: 1 %
HCT: 36.9 % (ref 36.0–46.0)
Hemoglobin: 12.4 g/dL (ref 12.0–15.0)
Immature Granulocytes: 0 %
Lymphocytes Relative: 38 %
Lymphs Abs: 3.8 10*3/uL (ref 0.7–4.0)
MCH: 31.2 pg (ref 26.0–34.0)
MCHC: 33.6 g/dL (ref 30.0–36.0)
MCV: 92.9 fL (ref 80.0–100.0)
Monocytes Absolute: 0.5 10*3/uL (ref 0.1–1.0)
Monocytes Relative: 6 %
Neutro Abs: 5.4 10*3/uL (ref 1.7–7.7)
Neutrophils Relative %: 54 %
Platelets: 349 10*3/uL (ref 150–400)
RBC: 3.97 MIL/uL (ref 3.87–5.11)
RDW: 12.3 % (ref 11.5–15.5)
WBC: 9.9 10*3/uL (ref 4.0–10.5)
nRBC: 0 % (ref 0.0–0.2)

## 2021-02-16 LAB — VITAMIN B12: Vitamin B-12: 327 pg/mL (ref 180–914)

## 2021-02-16 LAB — D-DIMER, QUANTITATIVE: D-Dimer, Quant: 5.77 ug/mL-FEU — ABNORMAL HIGH (ref 0.00–0.50)

## 2021-02-16 NOTE — Patient Instructions (Signed)
Thank you for choosing [facility] to provide your care.   Should you have questions after your visit to the [facility] ([facility]), please contact this office at [phone] between 8:30 AM and 4:30 PM.  Voice mails left after 4:00 PM may not be returned until the following business day.  Calls received after 4:30 PM will be answered by an off-site Nurse Triage Line.    Prescription Refills:  Please have your pharmacy contact us directly for most prescription requests.  Contact the office directly for refills of narcotics (pain medications). Allow 48-72 hours for refills.  Appointments: Please contact the [facility] scheduling department [phone] for questions regarding [facility] appointment scheduling.  Contact the schedulers with any scheduling changes so that your appointment can be rescheduled in a timely manner.   Central Scheduling for [facility] [phone] - Call to schedule procedures such as PET scans, CT scans, MRI, Ultrasound, etc.  To afford each patient quality time with our providers, please arrive 30 minutes before your scheduled appointment time.  If you arrive late for your appointment, you may be asked to reschedule.  We strive to give you quality time with our providers, and arriving late affects you and other patients whose appointments are after yours. If you are a no show for multiple scheduled visits, you may be dismissed from the clinic at the providers discretion.     Resources: [facility] Social Workers [phone] for additional information on assistance programs or assistance connecting with community support programs   [place] DSS  [phone]: Information regarding food stamps, Medicaid, and utility assistance [employer] Access [place] 336-333-6589   [place] [employer]'s shared-ride transportation service for eligible riders who have a disability that prevents them from riding the fixed route bus.   Medicare Rights Center 800-333-4114  Helps people with Medicare understand their rights and benefits, navigate the Medicare system, and secure the quality healthcare they deserve [employer] 800-227-2345 Assists patients locate various types of support and financial assistance Cancer Care: 1-800-813-HOPE (4673) Provides financial assistance, online support groups, medication/co-pay assistance.   Transportation Assistance for appointments at [facility]: Transportation Coordinator [phone]  Again, thank you for choosing [facility] for your care.       

## 2021-02-16 NOTE — Progress Notes (Addendum)
Marland Kitchen   HEMATOLOGY/ONCOLOGY CONSULTATION NOTE  Date of Service: 02/16/2021  Patient Care Team: Shirline Frees, MD as PCP - General (Family Medicine)  CHIEF COMPLAINTS/PURPOSE OF CONSULTATION:  Presence of IVC filter B/l DVT ?Paradoxical Embolism causing CVA  HISTORY OF PRESENTING ILLNESS:   [name] is a wonderful 51 y.o. female who has been referred to [place] by Dr Rosalin Hawking and Dr .Kenton Kingfisher, Gwyndolyn Saxon, MD for evaluation and management of unprovoked bilateral deep venous thrombosis.  Patient is here with her husband and most of the information was obtained from them and from information in her medical chart. Patient has a history of hypertension, diabetes, dyslipidemia and presented to the emergency room on 01/23/2021 with acute onset of aphasia and right-sided weakness. 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  Received TNKase and underwent successful thrombectomy. Work-up of her CVA included a TEE which showed a small PFO with bilateral shunting. Patient also had bilateral lower extremity venous duplex ultrasounds on 01/24/2021 which showed acute DVT of the right popliteal vein, right soleal veins and right gastrocnemius vein.  Acute DVT involving the left gastrocnemius vein.  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  Patient had an IVC filter placed due to inability to anticoagulate for her DVT.  Patient was discharged without any antiplatelet therapy or anticoagulation.  Her neurological symptoms have resolved to a significant extent.  No current headaches.  Her embolic CVA was thought to be possibly paradoxical embolus through the PFO from her DVT. She notes no significant lower extremity pain or leg swelling.  Prior to her CVA she notes no long distance travel.  No significant immobility. No previous history of VTE. No family history of VTE. Denies any hormonal exposure or hormone  replacement therapy. No recent COVID-19 infection or vaccination. No other overt provoking factors for her DVT noted.  Patient has follow-up with cardiology to determine the need for PFO closure. She has follow-up with Dr. Antony Contras for continued evaluation and management of her CVA with hemorrhagic transformation.   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 History of migraine headaches  SURGICAL HISTORY: Past Surgical History:  Procedure Laterality Date   BUBBLE STUDY  01/28/2021   Procedure: BUBBLE STUDY;  Surgeon: Pixie Casino, MD;  Location: [place];  Service: Cardiovascular;;   IR CT HEAD LTD  01/23/2021   IR IVC FILTER PLMT / S&I Burke Keels GUID/MOD SED  01/28/2021   IR PERCUTANEOUS ART THROMBECTOMY/INFUSION INTRACRANIAL INC DIAG ANGIO  01/23/2021   RADIOLOGY WITH ANESTHESIA N/A 01/23/2021   Procedure: IR WITH ANESTHESIA;  Surgeon: Radiologist, Medication, MD;  Location: [place];  Service: Radiology;  Laterality: N/A;   TEE WITHOUT CARDIOVERSION N/A 01/28/2021   Procedure: TRANSESOPHAGEAL ECHOCARDIOGRAM (TEE);  Surgeon: Pixie Casino, MD;  Location: [facility] END[facility]COPY;  Service: Cardiovascular;  Laterality: N/A;    SOCIAL HISTORY: Social History   Socioeconomic History   Marital status: Married    Spouse name: Not on file   Number of children: Not on file   Years of education: Not on file   Highest education level: Not on file  Occupational History   Not on file  Tobacco Use   Smoking status: Not on file   Smokeless tobacco: Not on file  Substance  and Sexual Activity   Alcohol use: Not on file   Drug use: Not on file   Sexual activity: Not on file  Other Topics Concern   Not on file  Social History Narrative   Not on file   Social Determinants of Health    Financial Resource Strain: Not on file  Food Insecurity: Not on file  Transportation Needs: Not on file  Physical Activity: Not on file  Stress: Not on file  Social Connections: Not on file  Intimate Partner Violence: Not on file    FAMILY HISTORY: No family history on file.  ALLERGIES:  has No Known Allergies.  MEDICATIONS:  Current Outpatient Medications  Medication Sig Dispense Refill   atorvastatin (LIPITOR) 40 MG tablet Take 1 tablet (40 mg total) by mouth daily. 30 tablet 0   gabapentin (NEURONTIN) 100 MG capsule Take 100 mg by mouth every evening.     lisinopril-hydrochlorothiazide (ZESTORETIC) 10-12.5 MG tablet Take 1 tablet by mouth every evening.     metFORMIN (GLUCOPHAGE-XR) 500 MG 24 hr tablet Take 500 mg by mouth daily with supper.     triamcinolone cream (KENALOG) 0.1 % Apply 1 application topically 2 (two) times daily as needed.     butalbital-acetaminophen-caffeine (FIORICET) 50-325-40 MG tablet Take 1 tablet by mouth every 6 (six) hours as needed for headache. (Patient not taking: Reported on 02/16/2021) 15 tablet 0   No current facility-administered medications for this visit.    REVIEW OF SYSTEMS:    10 Point review of Systems was done is negative except as noted above.  PHYSICAL EXAMINATION: ECOG PERFORMANCE STATUS:   . Vitals:   02/16/21 1128  BP: 131/86  Pulse: 86  Resp: 18  Temp: 97.9 F (36.6 C)  SpO2: 97%   Filed Weights   02/16/21 1128  Weight: 184 lb 12.8 oz (83.8 kg)   .Body mass index is 29.83 kg/m.  GENERAL:alert, in no acute distress and comfortable SKIN: no acute rashes, no significant lesions EYES: conjunctiva are pink and non-injected, sclera anicteric OROPHARYNX: MMM, no exudates, no oropharyngeal erythema or ulceration NECK: supple, no JVD LYMPH:  no palpable lymphadenopathy in the cervical, axillary or inguinal regions LUNGS: clear to auscultation b/l with normal respiratory effort HEART: regular rate & rhythm ABDOMEN:   normoactive bowel sounds , non tender, not distended. Extremity: No significant pedal edema, calf pain redness or tenderness. PSYCH: alert & oriented x 3 with fluent speech NEURO: no focal motor/sensory deficits  LABORATORY DATA:  I have reviewed the data as listed  . CBC Latest Ref Rng & Units 01/29/2021 01/28/2021 01/27/2021  WBC 4.0 - 10.5 K/uL 12.5(H) 10.5 10.9(H)  Hemoglobin 12.0 - 15.0 g/dL 13.0 12.5 12.2  Hematocrit 36.0 - 46.0 % 36.8 37.0 35.4(L)  Platelets 150 - 400 K/uL 184 179 176    . CMP Latest Ref Rng & Units 01/29/2021 01/28/2021 01/27/2021  Glucose 70 - 99 mg/dL 107(H) 118(H) 153(H)  BUN 6 - 20 mg/dL 13 12 10   Creatinine 0.44 - 1.00 mg/dL 0.66 0.59 0.57  Sodium 135 - 145 mmol/L 134(L) 133(L) 136  Potassium 3.5 - 5.1 mmol/L 3.6 4.5 3.3(L)  Chloride 98 - 111 mmol/L 99 101 101  CO2 22 - 32 mmol/L 23 21(L) 23  Calcium 8.9 - 10.3 mg/dL 9.5 9.3 9.3  Total Protein 6.5 - 8.1 g/dL - - -  Total Bilirubin 0.3 - 1.2 mg/dL - - -  Alkaline Phos 38 - 126 U/L - - -  AST 15 -  41 U/L - - -  ALT 0 - 44 U/L - - -   Phosphatidylserine antibodies Order: [id number] Collected 01/28/2021 03:12    0 Result Notes     Component Ref Range & Units 4 wk ago  Phosphatydalserine, IgG 0 - 30 Units 9   Comment: (NOTE)  Performed At: [facility]  [place], [place] [id number]  Rush Farmer MD CW:[id number]   Phosphatydalserine, IgM 0 - 30 Units <10   Phosphatydalserine, IgA 0 - 19 APS Units 1      View Full Report       Result Care Coordination   Patient Communication   Add Comments   Seen Back to Top       Cardiolipin antibodies, IgG, IgM, IgA Order: [id number] Collected 01/24/2021 11:15    0 Result Notes     Component Ref Range & Units 1 mo ago  Anticardiolipin IgG 0 - 14 GPL U/mL <9   Comment: (NOTE)                           Negative:              <15                           Indeterminate:     15 - 20                           Low-Med  Positive: >20 - 80                           High Positive:         >80   Anticardiolipin IgM 0 - 12 MPL U/mL <9   Comment: (NOTE)                           Negative:              <13                           Indeterminate:     13 - 20                           Low-Med Positive: >20 - 80                           High Positive:         >80   Anticardiolipin IgA 0 - 11 APL U/mL <9   Comment: (NOTE)                           Negative:              <12                           Indeterminate:     12 - 20                           Low-Med Positive: >20 - 80  High Positive:         >80  Performed At: [facility]  [place], [place] [id number]  Rush Farmer MD WU:[id number]      View Full Report       Result Care Coordination   Patient Communication   Add Comments   Seen Back to Top       Prothrombin gene mutation Order: [id number] Collected 01/24/2021 11:15    0 Result Notes    Component 1 mo ago  Recommendations-PTGENE: Comment   Comment: (NOTE)  Result: c.*97G>A - Not Detected  This result is not associated with an increased risk for venous  thromboembolism. See Additional Clinical Information and  Comments.  Additional Clinical Information:  Venous thromboembolism is a multifactorial disease influenced by  genetic, environmental, and circumstantial risk factors. The c.*97G>A  variant in the F2 gene is a genetic risk factor for venous  thromboembolism. Heterozygous carriers have a 2- to 4-fold increased  risk for venous thromboembolism. Homozygotes for the c.*97G>A variant  are rare. The annual risk of VTE in homozygotes has been reported to  be 1.1%/year. Individuals who carry both a c.*97G>A variant in the  F2 gene and a c.1601G>A (p. Arg534Gln) variant in the F5 gene  (commonly referred to as Factor V Leiden) have an approximately 20-  fold increased risk for venous thromboembolism. Risks are likely to  be even  higher in more complex genotype combinations involving the  F2 c.*97G>A variant and Factor V Leiden (PMID: 57846962). Additional  risk factors include but are not limited to: deficiency of protein C,  protein S, or antithrombin III, age, female sex, personal or family  history of deep vein thromboembolism, smoking, surgery, prolonged  immobilization, malignant neoplasm, tamoxifen treatment, raloxifene  treatment, oral contraceptive use, hormone replacement therapy, and  pregnancy. Management of thrombotic risk and thrombotic events should  follow established guidelines and fit the clinical circumstance. This  result cannot predict the occurrence or recurrence of a thrombotic  event.  Comments:  Genetic counseling is recommended to discuss the potential clinical  implications of positive results, as well as recommendations for  testing family members.  Genetic Coordinators are available for health care providers to  discuss  results at [phone]).  Test Details:  Variant analyzed: c.*97G>A, previously referred to as G20210A  Methods/Limitations:  DNA analysis of the F2 gene (NM_000506.5) was performed by PCR  amplification followed by restriction enzyme analysis. The diagnostic  sensitivity is >99%. Results must be combined with clinical  information for the most accurate interpretation. Molecular-based  testing is highly accurate, but as in any laboratory test, diagnostic  errors may occur. False positive or false negative results may occur  for reasons that include genetic variants, blood transfusions, bone  marrow transplantation, somatic or tissue-specific mosaicism,  mislabeled samples, or erroneous representation of family  relationships.  This test was developed and its performance characteristics determined  by [facility]. It has not been cleared or approved by the Food and Drug  Administration.  References:  Jamse Belfast [facility], Laurann Montana [facility]; [facility]  Professional  Practice and Guidelines Committee. Addendum: [place] consensus statement on factor V Leiden mutation  testing. Genet Med. 2021 Mar 5. doi: 10.1038/s41436-021-01108-x.  PMID: 41324401.  Kristopher Oppenheim. Prothrombin Thrombophilia. 2006 Jul 25  [Updated 2021 Feb 4]. In: Tarri Glenn, Ardinger HH, Pagon RA, et al.,  editors. GeneReviews(R) [Internet]. [address] ([place]): [place], [place]; 1993-2021. Available  from:  https://www.cook-brown.com/  Terrilee Files, Carla Drape, Marin Shutter CS;  [facility] Laboratory Quality Assurance Committee. Venous thromboembolism  laboratory testing (factor V Leiden and factor II c.*97G>A),  2018 update: a technical standard of the [place] ([facility]). Genet Med. 2018 Dec;20(12):1489-1498.  doi: 64.3329/J18841-660-6301-S. Epub 2018 Oct 5. PMID: 01093235.  Ruben Reason, PhD, [facility]  Jane Canary, PhD  Earlean Polka, PhD, [facility]  Fannie Knee, PhD, [facility]  Threasa Alpha, PhD, [facility]  W Gailen Shelter, PhD, [facility]  Lubertha South, PhD, [facility]  Alfredo Bach, PhD, [facility]  Performed At: [facility]  [address] [place], [place] [id number]  Katina Degree MDPhD YH:[id number]      View Full Report       Result Care Coordination   Patient Communication   Add Comments   Seen Back to Top       Factor 5 leiden Order: [id number] Collected 01/24/2021 11:15    0 Result Notes    Component 1 mo ago  Recommendations-F5LEID: Comment   Comment: (NOTE)  Result: c.1601G>A (p.Arg534Gln) - Not Detected  This result is not associated with an increased risk for venous  thromboembolism. See Additional Clinical Information and  Comments.  Additional Clinical Information:  Venous thromboembolism is a multifactorial disease influenced by  genetic, environmental, and circumstantial risk factors. The  c.1601G>A (p. Arg534Gln) variant in the F5  gene, commonly referred to  as Factor V Leiden, is a genetic risk factor for venous  thromboembolism. Heterozygous carriers of this variant have a 6- to 8-  fold increased risk for venous thromboembolism. Individuals  homozygous for this variant (ie, with a copy of the variant on each  chromosome) have an approximately 80-fold increased risk for venous  thromboembolism. Individuals who carry both a c.*97G>A variant in the  F2 gene and Factor V Leiden have an approximately 20-fold increased  risk for venous thromboembolism. Risks are likely to be even higher  in more complex genotype combinations involving the F2 c.*97G>A  variant and Factor V Leiden (PMID: 71062694). Additional risk factors  include but are not limited to: deficiency of protein C, protein S,  or antithrombin III, age, female sex, personal or family history of  deep vein thromboembolism, smoking, surgery, prolonged  immobilization, malignant neoplasm, tamoxifen treatment, raloxifene  treatment, oral contraceptive use, hormone replacement therapy, and  pregnancy. Management of thrombotic risk and thrombotic events should  follow established guidelines and fit the clinical circumstance. This  result cannot predict the occurrence or recurrence of a thrombotic  event.  Comment:  Genetic counseling is recommended to discuss the potential clinical  implications of positive results, as well as recommendations for  testing family members.  Genetic Coordinators are available for health care providers to  discuss results at [phone]).  Test Details:  Variant Analyzed: c.1601G>A (p. Arg534Gln), referred to as Factor V  Leiden  Methods/Limitations:  DNA analysis of the F5 gene (NM_000130.5) was performed by PCR  amplification followed by restriction enzyme analysis. The  diagnostic sensitivity is >99%. Results must be combined with  clinical information for the most accurate interpretation. Molecular-  based testing is  highly accurate, but as in any laboratory test,  diagnostic errors may occur. False positive or false negative results  may occur for reasons that include genetic variants, blood  transfusions, bone marrow transplantation, somatic or tissue-specific  mosaicism, mislabeled samples, or erroneous representation of family  relationships.  This test was developed and its performance characteristics  determined by [facility]. It has not been cleared or approved by the  Food and Drug Administration.  References:  Jamse Belfast [facility], Laurann Montana [facility]; [facility]  Professional Practice and Guidelines Committee. Addendum: [place] consensus statement on factor V Leiden  mutation testing. Genet Med. 2021 Mar 5. doi: 50.5397/Q73419-379-  01108-x. PMID: 02409735.  Kristopher Oppenheim. Factor V Leiden Thrombophilia. 1999 May 14  (Updated 2018 Jan 4). In: Tarri Glenn, Ardinger HH, Pagon RA, et al.,  editors. GeneReviews(R) (Internet). [address] ([place]): [place]  [place], [place]; 1993-2021. Available from:  MortgageHole.tn  Terrilee Files, Carla Drape, Marin Shutter CS;  [facility] Laboratory Quality Assurance Committee. Venous thromboembolism  laboratory testing (factor V Leiden and factor II c.*97G>A), 2018  update: a technical standard of the [place] ([facility]). Genet Med. 2018 Dec;20(12):1489-1498.  doi: 32.9924/Q68341-962-2297-L. Epub 2018 Oct 5. PMID: 89211941.  Ruben Reason, PhD, [facility]  Jane Canary, PhD  Earlean Polka, PhD, [facility]  Fannie Knee, PhD, [facility]  Threasa Alpha, PhD, [facility]  W Gailen Shelter, PhD, [facility]  Lubertha South, PhD, [facility]  Alfredo Bach, PhD, [facility]  Performed At: [facility]  [address] [place], [place] [id number]  Katina Degree MDPhD EH:[id number]      View Full Report       Result Care Coordination   Patient Communication   Add Comments    Seen Back to Top       Beta-2-glycoprotein i abs, IgG/M/A Order: [id number] Collected 01/24/2021 11:15    0 Result Notes     Component Ref Range & Units 1 mo ago  Beta-2 Glyco I IgG 0 - 20 GPI IgG units <9   Comment: (NOTE)  The reference interval reflects a 3SD or 99th percentile interval,  which is thought to represent a potentially clinically significant  result in accordance with the International Consensus Statement on  the classification criteria for definitive antiphospholipid  syndrome (APS). J Thromb Haem 2006;4:295-306.   Beta-2-Glycoprotein I IgM 0 - 32 GPI IgM units <9   Comment: (NOTE)  The reference interval reflects a 3SD or 99th percentile interval,  which is thought to represent a potentially clinically significant  result in accordance with the International Consensus Statement on  the classification criteria for definitive antiphospholipid  syndrome (APS). J Thromb Haem 2006;4:295-306.  Performed At: [facility]  [place], [place] [id number]  Rush Farmer MD EH:[id number]   Beta-2-Glycoprotein I IgA 0 - 25 GPI IgA units <9   Comment: (NOTE)  The reference interval reflects a 3SD or 99th percentile interval,  which is thought to represent a potentially clinically significant  result in accordance with the International Consensus Statement on  the classification criteria for definitive antiphospholipid  syndrome (APS). J Thromb Haem 2006;4:295-306.      View Full Report       Result Care Coordination   Patient Communication   Add Comments   Seen Back to Top       Lupus anticoagulant panel Order: [id number] Collected 01/24/2021 11:15    Status: Edited Result - FINAL    0 Result Notes     Component Ref Range & Units 1 mo ago  PTT Lupus Anticoagulant 0.0 - 51.9 sec 29.4   DRVVT 0.0 - 47.0 sec 33.4   Lupus Anticoag Interp  Comment: VC   Comment: (NOTE)  No lupus anticoagulant was detected.  Performed At: [facility]  [place], [place] [id number]  Rush Farmer MD UQ:[id number]      View Full Report     VC=Value has a corrected status       Result Care Coordination   Patient Communication   Add Comments   Seen Back to Top       Protein S, total Order: [id number] Collected 01/24/2021 11:15    0 Result Notes     Component Ref Range & Units 1 mo ago  Protein S Ag, Total 60 - 150 % 90   Comment: (NOTE)  This test was developed and its performance characteristics  determined by [facility]. It has not been cleared or approved  by the Food and Drug Administration.  Performed At: [facility]  [place], [place] [id number]  Rush Farmer MD OM:[id number]      View Full Report       Result Care Coordination   Patient Communication   Add Comments   Seen Back to Top       Protein S activity Order: [id number] Collected 01/24/2021 11:15    0 Result Notes     Component Ref Range & Units 1 mo ago  Protein S Activity 63 - 140 % 72   Comment: (NOTE)  Protein S activity may be falsely increased (masking an abnormal, low  result) in patients receiving direct Xa inhibitor (e.g.,  rivaroxaban, apixaban, edoxaban) or a direct thrombin inhibitor  (e.g., dabigatran) anticoagulant treatment due to assay interference  by these drugs.  Performed At: [facility]  [place], [place] [id number]  Rush Farmer MD IB:[id number]      View Full Report       Result Care Coordination   Patient Communication   Add Comments   Seen Back to Top        Contains abnormal data Protein C, total Order: [id number] Collected 01/24/2021 11:15    0 Result Notes     Component Ref Range & Units 1 mo ago  Protein C, Total 60 - 150 % 151 High    Comment: (NOTE)  Performed At: [facility]  [place], [place] [id number]  Rush Farmer MD TA:[id number]      View Full Report       Result Care  Coordination   Patient Communication   Add Comments   Seen Back to Top       Protein C activity Order: [id number] Collected 01/24/2021 11:15    0 Result Notes     Component Ref Range & Units 1 mo ago  Protein C Activity 73 - 180 % 148   Comment: (NOTE)  Performed At: [facility]  [place], [place] [id number]  Rush Farmer MD [id number]      View Full Report       Result Care Coordination   Patient Communication   Add Comments   Seen Back to Top       Antithrombin III Order: [id number] Collected 01/24/2021 11:15    0 Result Notes     Component Ref Range & Units 1 mo ago  AntiThromb III Func 75 - 120 % 104   Comment: Performed at [facility], [place] [address], [place], [place] [address]      RADIOGRAPHIC STUDIES: I have personally reviewed the radiological images as listed and agreed with  the findings in the report. CT HEAD WO CONTRAST (5MM)  Result Date: 01/29/2021 CLINICAL DATA:  Follow-up hemorrhagic stroke with left-sided headache right EXAM: CT HEAD WITHOUT CONTRAST TECHNIQUE: Contiguous axial images were obtained from the base of the skull through the vertex without intravenous contrast. COMPARISON:  Two days ago FINDINGS: Brain: Left hemispheric infarcts with hemorrhage at the level of the insula and local subarachnoid hemorrhage is also patchy early seen at the left vertex. No progression of these insult since prior. There is question of cytotoxic edema newly seen in the left parietal lobe. No significant midline shift and no hydrocephalus. Vascular: No hyperdense vessel or unexpected calcification. Skull: Normal. Negative for fracture or focal lesion. Sinuses/Orbits: No acute finding.  Bilateral cataract resection. IMPRESSION: 1. Question acute left parietal infarction since most recent head CT and MRI, located near mild subarachnoid hemorrhage at the left vertex. 2. No progression of left insular hemorrhage and adjacent  infarction. Electronically Signed   By: Jorje Guild M.D.   On: 01/29/2021 08:23   CT HEAD WO CONTRAST (5MM)  Result Date: 01/27/2021 CLINICAL DATA:  Stroke, follow-up EXAM: CT HEAD WITHOUT CONTRAST TECHNIQUE: Contiguous axial images were obtained from the base of the skull through the vertex without intravenous contrast. COMPARISON:  01/25/2021 FINDINGS: Brain: Intraparenchymal hemorrhage, centered in the left temporal lobe, possibly involving the lateral aspect of the left basal ganglia, measuring approximately 4.1 x 2.1 x 2.0 cm (AP x TR x CC) (series 3, image 12 and series 5, image 33). Surrounding hypodensity, most likely edema, with mild mass effect narrowing the left lateral ventricle, which does not appear significantly changed compared to previous mass effect seen on 01/25/2021. No significant midline shift. Adjacent to the intraparenchymal hemorrhage, there is additional subarachnoid hemorrhage extending more superiorly into the left frontal lobe and inferiorly into the temporal lobe. Subarachnoid hemorrhage is also seen in the left posterior frontal lobe and anterior parietal lobe (series 3, image 23). Possible trace subdural hemorrhage overlying the left frontal lobe (series 3, image 18) versus additional subarachnoid hemorrhage. Vascular: No hyperdense vessel. Skull: Normal. Negative for fracture or focal lesion. Sinuses/Orbits: No acute finding. Other: The mastoids are well aerated. IMPRESSION: 1. Intraparenchymal hemorrhage in the left temporal lobe, concerning for hemorrhagic transformation of the previously noted infarct involving the insula. Mild surrounding edema with mass effect narrowing the left lateral ventricle, not significantly changed from the prior CT. No midline shift. 2. Subarachnoid hemorrhage surrounding the intraparenchymal hemorrhage, extending into the left frontal and temporal lobes, with additional subarachnoid hemorrhage in the left posterior frontal lobe and anterior  parietal lobe. 3. Possible trace subdural hemorrhage overlying the left frontal lobe. These results were called by telephone at the time of interpretation on 01/27/2021 at 4:59 pm to provider [facility] , who verbally acknowledged these results. Electronically Signed   By: Merilyn Baba M.D.   On: 01/27/2021 17:02   CT HEAD WO CONTRAST (5MM)  Result Date: 01/25/2021 CLINICAL DATA:  Neuro deficit, acute, stroke suspected.  Follow-up. EXAM: CT HEAD WITHOUT CONTRAST TECHNIQUE: Contiguous axial images were obtained from the base of the skull through the vertex without intravenous contrast. COMPARISON:  MRI yesterday.  CT studies 01/23/2021. FINDINGS: Brain: No abnormality affects the brainstem or cerebellum. The right cerebral hemisphere is normal. There is swelling in the left basal ganglia and to a lesser extent in the inferior insula consistent with infarctions shown by MRI. No evidence of hemorrhagic transformation. No new insult is seen. No hydrocephalus or extra-axial  collection. Vascular: No abnormal vascular finding. Skull: Negative Sinuses/Orbits: Clear/normal Other: None IMPRESSION: Low-density and swelling in the left basal ganglia and the inferior insula consistent with the areas of acute infarction shown by MRI. No evidence of hemorrhagic transformation by CT or of midline shift. Electronically Signed   By: Nelson Chimes M.D.   On: 01/25/2021 12:40   MR BRAIN WO CONTRAST  Addendum Date: 01/24/2021   ADDENDUM REPORT: 01/24/2021 17:38 ADDENDUM: Findings in impressions 2 and 4 called to Dr. Reeves Forth by telephone at approximately 5:30 p.m. on 01/24/2021. Electronically Signed   By: Kellie Simmering D.O.   On: 01/24/2021 17:38   Result Date: 01/24/2021 CLINICAL DATA:  Provided history: Stroke, follow-up. EXAM: MRI HEAD WITHOUT CONTRAST TECHNIQUE: Multiplanar, multiecho pulse sequences of the brain and surrounding structures were obtained without intravenous contrast. COMPARISON:  CT angiogram head/neck and  non-contrast head CT 01/23/2021. FINDINGS: Brain: Cerebral volume is normal. Acute infarcts involving much of the left caudate body and left putamen. Small acute infarcts are also present within the anterior limb of internal capsule, left insula/subinsular white matter as well as cortex of the superior left temporal lobe. SWI signal loss at site of the acute infarcts within the left basal ganglia, likely reflecting petechial hemorrhage (Heidelberg 1b, HI 2- confluent petechiae, no mass effect). Mild petechial hemorrhage is also present along the left sylvian fissure. 2-3 mm acute cortical infarct within the left occipital lobe (series 2, image 30). Small-to-moderate volume acute subarachnoid hemorrhage along the left cerebral hemisphere, not significantly increased in volume as compared to the postprocedural head CT yesterday, but with interval redistribution. No evidence of an intracranial mass. No midline shift. Vascular: Maintained flow voids within the proximal large arterial vessels. Skull and upper cervical spine: No focal suspicious marrow lesion. Visualized orbits show no acute finding. Bilateral lens replacements. Trace mucosal thickening within the bilateral ethmoid, sphenoid and right maxillary sinuses. Sinuses/Orbits: Maintained flow voids within the proximal large arterial vessels. 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 Susceptibility-weighted signal loss at site of the acute infarcts within the left basal ganglia, likely reflecting petechial hemorrhage (Heidelberg 1b, HI2- confluent petechiae, no mass effect). Consider a non-contrast head CT for CT characterization of this hemorrhage, and for a more direct comparison with the postprocedural head CT performed yesterday. Mild petechial hemorrhage along the left sylvian fissure. 2-3 mm acute cortical  infarct within the left occipital lobe (left PCA territory). Small-to-moderate volume acute subarachnoid hemorrhage along the left cerebral hemisphere, not significantly changed in volume, but with interval redistribution. Electronically Signed: By: Kellie Simmering D.O. On: 01/24/2021 17:21   IR IVC FILTER PLMT / S&I Burke Keels GUID/MOD SED  Result Date: 01/28/2021 INDICATION: Intracranial hemorrhage.  Unable to anticoagulate. EXAM: 1. ULTRASOUND GUIDANCE FOR VASCULAR ACCESS 2. IVC CATHETERIZATION AND VENOGRAM 3. IVC FILTER INSERTION MEDICATIONS: None. ANESTHESIA/SEDATION: Fentanyl 50 mcg IV; Versed 2 mg IV Sedation Time: 65 minutes; The patient was continuously monitored during the procedure by the interventional radiology nurse under my direct supervision. CONTRAST:  66m OMNIPAQUE IOHEXOL 300 MG/ML  SOLN FLUOROSCOPY TIME:  1 minutes 0 seconds (11 mGy) COMPLICATIONS: None immediate. PROCEDURE: Informed written consent was obtained from the the patient and/or patient's representative following explanation of the procedure, risks, benefits and alternatives. A time out was performed prior to the initiation of the procedure. Maximal barrier sterile technique utilized including caps, mask, sterile  gowns, sterile gloves, large sterile drape, hand hygiene, and Betadine prep. Under sterile condition and local anesthesia, right common femoral venous access was performed with ultrasound. An ultrasound image was saved and sent to PACS. Over a guidewire, the IVC filter delivery sheath and inner dilator were advanced into the IVC just above the IVC bifurcation. Contrast injection was performed for an IVC venogram. Through the delivery sheath, a retrievable Denali IVC filter was deployed below the level of the renal veins and above the IVC bifurcation. Limited post deployment venacavagram was performed. The delivery sheath was removed and hemostasis was obtained with manual compression. A dressing was placed. The patient tolerated  the procedure well without immediate post procedural complication. FINDINGS: 1. The IVC is patent. No evidence of thrombus, stenosis, or occlusion. No variant venous anatomy. 2. Successful placement of the IVC filter below the level of the renal veins. IMPRESSION: Successful placement of a retrievable, infrarenal IVC filter as above. PLAN: IVC filters can cause complications when left in place for extended periods of time. If medically appropriate, recommend discontinuing filter prior to discharge. Please re-evaluate the patient for filter discontinuation when they are seen in follow up, and refer patient back to Interventional Radiology for removal. Michaelle Birks, MD [facility] Radiology Electronically Signed   By: Michaelle Birks M.D.   On: 01/28/2021 21:12   IR CT Head Ltd  Result Date: 01/28/2021 INDICATION: New onset global aphasia and right-sided hemiplegia. 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 EXAM: 1. EMERGENT LARGE VESSEL OCCLUSION THROMBOLYSIS (anterior CIRCULATION) COMPARISON:  CT angiogram of the head and neck of January 23, 2021. MEDICATIONS: Ancef 2 g IV antibiotic was administered within 1 hour of the procedure. ANESTHESIA/SEDATION: General anesthesia. CONTRAST:  Omnipaque 300 approximately 65 mL. FLUOROSCOPY TIME:  Fluoroscopy Time: 15 minutes 42 seconds (792.8 mGy). COMPLICATIONS: None immediate. TECHNIQUE: Following a full explanation of the procedure along with the potential associated complications, an informed witnessed consent was obtained. The risks of intracranial hemorrhage of 10%, worsening neurological deficit, ventilator dependency, death and inability to revascularize were all reviewed in detail with the patient's spouse. The patient was then put under general anesthesia by the Department of Anesthesiology at [facility]. The right groin was prepped and  draped in the usual sterile fashion. Thereafter using modified Seldinger technique, transfemoral access into the right common femoral artery was obtained without difficulty. Over an 0.035 inch guidewire an 8 French Pinnacle sheath was inserted. Through this, and also over an 0.035 inch guidewire a 5 [place] JB 1 catheter was advanced to the aortic arch region and selectively positioned in the left common carotid artery. FINDINGS: The left common carotid arteriogram demonstrates the left external carotid artery and its major branches to be widely patent. The left internal carotid artery at the bulb to the cranial skull base is widely patent. The petrous, the cavernous and the supraclinoid segments are widely patent. A left posterior communicating artery is seen opacifying the left posterior cerebral artery distribution. Left anterior cerebral artery opacifies into the capillary and venous phases. The left middle cerebral artery demonstrates an early bifurcation with wide patency of the superior division. The inferior division demonstrates a proximal complete occlusion with a large area of hypoperfusion of the left parietal cortical and subcortical area. PROCEDURE: The diagnostic JB 1 catheter in the left common carotid artery was then exchanged over an 0.035 inch 300 cm Rosen exchange guidewire for  an 087 95 cm balloon guide catheter which had been prepped with 50% contrast and 50% heparinized saline infusion. The guide catheter was advanced to the distal 1/3 of the left internal carotid artery. The guidewire was removed. Good aspiration obtained from the hub of the balloon guide catheter. A gentle control arteriogram performed through the balloon guide catheter demonstrated no change in the extracranial or intracranial circulations. Over an 0.014 inch standard Synchro micro guidewire with a moderate J configuration, an 021 160 microcatheter inside of an 055 132 cm Zoom aspiration catheter combination was advanced to  the supraclinoid left ICA. Using a torque device, the micro guidewire was then gently advanced without any difficulty into the M3 region of the inferior division of the left middle cerebral artery followed by the microcatheter. The guidewire was removed. Good aspiration obtained from the hub of the microcatheter. Gentle contrast injection through the microcatheter demonstrated safe positioning of the tip of the microcatheter, which was connected to continuous heparinized saline infusion. At this time the Zoom aspiration catheter was advanced and positioned just proximal to the occluded left MCA inferior division. A 3 mm x 40 mm Solitaire X retrieval device was then advanced to the distal end of the microcatheter. The O ring on the delivery microcatheter was loosened. With slight forward gentle traction with the right hand on the delivery micro guidewire with left hand the retrieval device was then advanced. With constant aspiration applied the hub of the balloon guide catheter proximally with proximal flow arrest, and with a Penumbra aspiration device through the Zoom catheter for approximately 3 minutes, the combination of the retrieval device, the microcatheter and the Zoom aspiration catheter were retrieved and removed. Long segment of clot was seen entangled in the retrieval device. Following reversal of flow arrest, a control arteriogram performed through the balloon guide catheter in the left internal carotid artery now demonstrated complete revascularization of the inferior division. A TICI 3 MCA revascularization was achieved. The left posterior communicating artery and the left anterior cerebral artery remained widely patent. Moderate spasm in the proximal left inferior division responded to 3 aliquots of 25 mcg of nitroglycerin. A final control arteriogram performed through the balloon guide in the left common carotid artery continued to demonstrate wide patency of the left internal carotid extra cranially  and intracranially. No evidence of intraluminal filling defects or of occlusion was noted. Balloon guide was removed. The 8 French Pinnacle sheath was removed with hemostasis in the right groin obtained with a 6 French Angio-Seal closure device, and manual compression with a quick clot for 15 minutes. Distal pulses remained palpable in all feet unchanged. A CT of the brain obtained demonstrated no evidence of mass effect or midline shift. There was mild hyperattenuation in the subarachnoid space in the left perisylvian area. Trial of extubation was unsuccessful as the patient was unresponsive. Patient was, therefore, left intubated and transferred to the neuro ICU for post revascularization care. IMPRESSION: Status post endovascular complete revascularization of occluded inferior division of the left middle cerebral artery with 1 pass with a 3 mm x 40 mm Solitaire X retrieval device, and contact aspiration achieving a TICI 3 revascularization. PLAN: Follow-up as per referring MD. Electronically Signed   By: Luanne Bras M.D.   On: 01/28/2021 08:42   DG Abd Portable 1V  Result Date: 01/28/2021 CLINICAL DATA:  Evaluate for IVC filter. EXAM: PORTABLE ABDOMEN - 1 VIEW COMPARISON:  Chest XR, 04/04/2012 FINDINGS: The bowel gas pattern is normal. No radio-opaque calculi  or other significant radiographic abnormality are seen. IMPRESSION: 1. Nonobstructed bowel gas pattern. 2. No pre-existing IVC filter. Electronically Signed   By: Michaelle Birks M.D.   On: 01/28/2021 09:37   ECHO TEE  Result Date: 01/28/2021    TRANSESOPHOGEAL ECHO REPORT   Patient Name:   [name] Date of Exam: 01/28/2021 Medical Rec #:  [id number]       Height:       66.0 in Accession #:    [id number]      Weight:       190.5 lb Date of Birth:  Aug 16, 1970      BSA:          1.959 m Patient Age:    62 years        BP:           101/66 mmHg Patient Gender: F               HR:           88 bpm. Exam Location:  Inpatient Procedure:  Cardiac Doppler, Transesophageal Echo, 3D Echo, Color Doppler and            Saline Contrast Bubble Study Indications:     PFO  History:         Patient has prior history of Echocardiogram examinations, most                  recent 01/24/2021.  Sonographer:     Bernadene Person RDCS Referring Phys:  [id number] Altamease Oiler [place] Diagnosing Phys: Lyman Bishop MD PROCEDURE: After discussion of the risks and benefits of a TEE, an informed consent was obtained from the patient. The transesophogeal probe was passed without difficulty through the esophogus of the patient. Local oropharyngeal anesthetic was provided with Cetacaine. Sedation performed by different physician. The patient was monitored while under deep sedation. Anesthestetic sedation was provided intravenously by Anesthesiology: 278.37m of Propofol. The patient's vital signs; including heart rate, blood pressure, and oxygen saturation; remained stable throughout the procedure. The patient developed no complications during the procedure. IMPRESSIONS  1. Left ventricular ejection fraction, by estimation, is 55 to 60%. The left ventricle has normal function. The left ventricle has no regional wall motion abnormalities.  2. Right ventricular systolic function is normal. The right ventricular size is normal.  3. No left atrial/left atrial appendage thrombus was detected.  4. The mitral valve is normal in structure. No evidence of mitral valve regurgitation.  5. The aortic valve is tricuspid. Aortic valve regurgitation is trivial.  6. Evidence of atrial level shunting detected by color flow Doppler. Agitated saline contrast bubble study was positive with shunting observed within 3-6 cardiac cycles suggestive of interatrial shunt. There is a small patent foramen ovale with bidirectional shunting across atrial septum. Conclusion(s)/Recommendation(s): Findings are concerning for an interatrial shunt as detailed above. Consider outpatient PFO closure after recovery  from intracerebral hemorrhage. FINDINGS  Left Ventricle: Left ventricular ejection fraction, by estimation, is 55 to 60%. The left ventricle has normal function. The left ventricle has no regional wall motion abnormalities. The left ventricular internal cavity size was normal in size. There is  no left ventricular hypertrophy. Right Ventricle: The right ventricular size is normal. No increase in right ventricular wall thickness. Right ventricular systolic function is normal. Left Atrium: Left atrial size was normal in size. No left atrial/left atrial appendage thrombus was detected. Right Atrium: Right atrial size was normal in size. Pericardium: There is  no evidence of pericardial effusion. Mitral Valve: The mitral valve is normal in structure. No evidence of mitral valve regurgitation. Tricuspid Valve: The tricuspid valve is grossly normal. Tricuspid valve regurgitation is trivial. Aortic Valve: The aortic valve is tricuspid. Aortic valve regurgitation is trivial. Pulmonic Valve: The pulmonic valve was normal in structure. Pulmonic valve regurgitation is not visualized. Aorta: The aortic root and ascending aorta are structurally normal, with no evidence of dilitation. Venous: The left upper pulmonary vein and left lower pulmonary vein are normal. IAS/Shunts: The interatrial septum is aneurysmal. Evidence of atrial level shunting detected by color flow Doppler. Agitated saline contrast was given intravenously to evaluate for intracardiac shunting. Agitated saline contrast bubble study was positive  with shunting observed within 3-6 cardiac cycles suggestive of interatrial shunt. A small patent foramen ovale is detected with bidirectional shunting across atrial septum. Lyman Bishop MD Electronically signed by Lyman Bishop MD Signature Date/Time: 01/28/2021/9:35:33 AM    Final    ECHOCARDIOGRAM COMPLETE BUBBLE STUDY  Result Date: 01/24/2021    ECHOCARDIOGRAM REPORT   Patient Name:   [name] Date of  Exam: 01/24/2021 Medical Rec #:  [id number]       Height:       66.0 in Accession #:    [id number]      Weight:       190.5 lb Date of Birth:  1971/01/23      BSA:          1.959 m Patient Age:    68 years        BP:           140/73 mmHg Patient Gender: F               HR:           101 bpm. Exam Location:  Inpatient Procedure: 2D Echo, Cardiac Doppler, Color Doppler and Saline Contrast Bubble            Study Indications:    Stroke  History:        Patient has no prior history of Echocardiogram examinations.                 Risk Factors:Hypertension, Diabetes and Dyslipidemia.  Sonographer:    Clayton Lefort RDCS (AE) Referring Phys: [place]  1. Left ventricular ejection fraction, by estimation, is 65 to 70%. The left ventricle has normal function. The left ventricle has no regional wall motion abnormalities. Left ventricular diastolic parameters are consistent with Grade I diastolic dysfunction (impaired relaxation).  2. Right ventricular systolic function is normal. The right ventricular size is normal. There is mildly elevated pulmonary artery systolic pressure.  3. Left atrial size was mildly dilated.  4. The mitral valve is normal in structure. No evidence of mitral valve regurgitation. No evidence of mitral stenosis.  5. The aortic valve is normal in structure. There is mild calcification of the aortic valve. Aortic valve regurgitation is not visualized. No aortic stenosis is present.  6. The inferior vena cava is normal in size with greater than 50% respiratory variability, suggesting right atrial pressure of 3 mmHg.  7. Agitated saline contrast bubble study was positive with shunting observed within 3-6 cardiac cycles suggestive of interatrial shunt. The bubble study was mildly positive on the first few beats but markedly positive on the 5th beat. Suspect intracardiac shunting but may also have extracardai source. Suggest TEE to further evaluate, if clinically indicated. FINDINGS   Left Ventricle:  Left ventricular ejection fraction, by estimation, is 65 to 70%. The left ventricle has normal function. The left ventricle has no regional wall motion abnormalities. The left ventricular internal cavity size was normal in size. There is  no left ventricular hypertrophy. Left ventricular diastolic parameters are consistent with Grade I diastolic dysfunction (impaired relaxation). Right Ventricle: The right ventricular size is normal. No increase in right ventricular wall thickness. Right ventricular systolic function is normal. There is mildly elevated pulmonary artery systolic pressure. The tricuspid regurgitant velocity is 3.04  m/s, and with an assumed right atrial pressure of 8 mmHg, the estimated right ventricular systolic pressure is 82.9 mmHg. Left Atrium: Left atrial size was mildly dilated. Right Atrium: Right atrial size was normal in size. Pericardium: There is no evidence of pericardial effusion. Mitral Valve: The mitral valve is normal in structure. No evidence of mitral valve regurgitation. No evidence of mitral valve stenosis. MV peak gradient, 5.3 mmHg. The mean mitral valve gradient is 3.0 mmHg. Tricuspid Valve: The tricuspid valve is normal in structure. Tricuspid valve regurgitation is mild . No evidence of tricuspid stenosis. Aortic Valve: The aortic valve is normal in structure. There is mild calcification of the aortic valve. Aortic valve regurgitation is not visualized. No aortic stenosis is present. Aortic valve mean gradient measures 5.0 mmHg. Aortic valve peak gradient measures 10.0 mmHg. Aortic valve area, by VTI measures 2.43 cm. Pulmonic Valve: The pulmonic valve was normal in structure. Pulmonic valve regurgitation is not visualized. No evidence of pulmonic stenosis. Aorta: The aortic root is normal in size and structure. Venous: The inferior vena cava is normal in size with greater than 50% respiratory variability, suggesting right atrial pressure of 3 mmHg.  IAS/Shunts: No atrial level shunt detected by color flow Doppler. Agitated saline contrast was given intravenously to evaluate for intracardiac shunting. Agitated saline contrast bubble study was positive with shunting observed within 3-6 cardiac cycles suggestive of interatrial shunt.  LEFT VENTRICLE PLAX 2D LVIDd:         4.10 cm   Diastology LVIDs:         2.40 cm   LV e' medial:    15.60 cm/s LV PW:         0.80 cm   LV E/e' medial:  5.3 LV IVS:        0.90 cm   LV e' lateral:   18.90 cm/s LVOT diam:     1.90 cm   LV E/e' lateral: 4.4 LV SV:         61 LV SV Index:   31 LVOT Area:     2.84 cm  RIGHT VENTRICLE             IVC RV Basal diam:  3.30 cm     IVC diam: 1.80 cm RV S prime:     17.70 cm/s TAPSE (M-mode): 2.7 cm LEFT ATRIUM             Index        RIGHT ATRIUM           Index LA diam:        3.60 cm 1.84 cm/m   RA Area:     14.80 cm LA Vol (A2C):   41.8 ml 21.34 ml/m  RA Volume:   34.70 ml  17.72 ml/m LA Vol (A4C):   34.4 ml 17.56 ml/m LA Biplane Vol: 38.2 ml 19.50 ml/m  AORTIC VALVE AV Area (Vmax):    2.21 cm AV Area (Vmean):  2.22 cm AV Area (VTI):     2.43 cm AV Vmax:           158.00 cm/s AV Vmean:          105.000 cm/s AV VTI:            0.251 m AV Peak Grad:      10.0 mmHg AV Mean Grad:      5.0 mmHg LVOT Vmax:         123.00 cm/s LVOT Vmean:        82.300 cm/s LVOT VTI:          0.215 m LVOT/AV VTI ratio: 0.86  AORTA Ao Root diam: 3.20 cm Ao Asc diam:  2.80 cm MITRAL VALVE                TRICUSPID VALVE MV Area (PHT): 3.83 cm     TR Peak grad:   37.0 mmHg MV Area VTI:   2.29 cm     TR Vmax:        304.00 cm/s MV Peak grad:  5.3 mmHg MV Mean grad:  3.0 mmHg     SHUNTS MV Vmax:       1.15 m/s     Systemic VTI:  0.22 m MV Vmean:      80.5 cm/s    Systemic Diam: 1.90 cm MV Decel Time: 198 msec MV E velocity: 82.30 cm/s MV A velocity: 101.00 cm/s MV E/A ratio:  0.81 Glori Bickers MD Electronically signed by Glori Bickers MD Signature Date/Time: 01/24/2021/3:24:36 PM    Final    IR  PERCUTANEOUS ART THROMBECTOMY/INFUSION INTRACRANIAL INC DIAG ANGIO  Result Date: 01/28/2021 INDICATION: New onset global aphasia and right-sided hemiplegia. 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 EXAM: 1. EMERGENT LARGE VESSEL OCCLUSION THROMBOLYSIS (anterior CIRCULATION) COMPARISON:  CT angiogram of the head and neck of January 23, 2021. MEDICATIONS: Ancef 2 g IV antibiotic was administered within 1 hour of the procedure. ANESTHESIA/SEDATION: General anesthesia. CONTRAST:  Omnipaque 300 approximately 65 mL. FLUOROSCOPY TIME:  Fluoroscopy Time: 15 minutes 42 seconds (792.8 mGy). COMPLICATIONS: None immediate. TECHNIQUE: Following a full explanation of the procedure along with the potential associated complications, an informed witnessed consent was obtained. The risks of intracranial hemorrhage of 10%, worsening neurological deficit, ventilator dependency, death and inability to revascularize were all reviewed in detail with the patient's spouse. The patient was then put under general anesthesia by the Department of Anesthesiology at [facility]. The right groin was prepped and draped in the usual sterile fashion. Thereafter using modified Seldinger technique, transfemoral access into the right common femoral artery was obtained without difficulty. Over an 0.035 inch guidewire an 8 French Pinnacle sheath was inserted. Through this, and also over an 0.035 inch guidewire a 5 [place] JB 1 catheter was advanced to the aortic arch region and selectively positioned in the left common carotid artery. FINDINGS: The left common carotid arteriogram demonstrates the left external carotid artery and its major branches to be widely patent. The left internal carotid artery at the bulb to the cranial skull base is widely patent. The petrous, the cavernous and the supraclinoid segments are widely patent. A left posterior communicating artery is  seen opacifying the left posterior cerebral artery distribution. Left anterior cerebral artery opacifies into the capillary and venous phases. The left middle cerebral artery demonstrates an early bifurcation with wide patency of the superior division. The inferior division  demonstrates a proximal complete occlusion with a large area of hypoperfusion of the left parietal cortical and subcortical area. PROCEDURE: The diagnostic JB 1 catheter in the left common carotid artery was then exchanged over an 0.035 inch 300 cm Rosen exchange guidewire for an 087 95 cm balloon guide catheter which had been prepped with 50% contrast and 50% heparinized saline infusion. The guide catheter was advanced to the distal 1/3 of the left internal carotid artery. The guidewire was removed. Good aspiration obtained from the hub of the balloon guide catheter. A gentle control arteriogram performed through the balloon guide catheter demonstrated no change in the extracranial or intracranial circulations. Over an 0.014 inch standard Synchro micro guidewire with a moderate J configuration, an 021 160 microcatheter inside of an 055 132 cm Zoom aspiration catheter combination was advanced to the supraclinoid left ICA. Using a torque device, the micro guidewire was then gently advanced without any difficulty into the M3 region of the inferior division of the left middle cerebral artery followed by the microcatheter. The guidewire was removed. Good aspiration obtained from the hub of the microcatheter. Gentle contrast injection through the microcatheter demonstrated safe positioning of the tip of the microcatheter, which was connected to continuous heparinized saline infusion. At this time the Zoom aspiration catheter was advanced and positioned just proximal to the occluded left MCA inferior division. A 3 mm x 40 mm Solitaire X retrieval device was then advanced to the distal end of the microcatheter. The O ring on the delivery microcatheter  was loosened. With slight forward gentle traction with the right hand on the delivery micro guidewire with left hand the retrieval device was then advanced. With constant aspiration applied the hub of the balloon guide catheter proximally with proximal flow arrest, and with a Penumbra aspiration device through the Zoom catheter for approximately 3 minutes, the combination of the retrieval device, the microcatheter and the Zoom aspiration catheter were retrieved and removed. Long segment of clot was seen entangled in the retrieval device. Following reversal of flow arrest, a control arteriogram performed through the balloon guide catheter in the left internal carotid artery now demonstrated complete revascularization of the inferior division. A TICI 3 MCA revascularization was achieved. The left posterior communicating artery and the left anterior cerebral artery remained widely patent. Moderate spasm in the proximal left inferior division responded to 3 aliquots of 25 mcg of nitroglycerin. A final control arteriogram performed through the balloon guide in the left common carotid artery continued to demonstrate wide patency of the left internal carotid extra cranially and intracranially. No evidence of intraluminal filling defects or of occlusion was noted. Balloon guide was removed. The 8 French Pinnacle sheath was removed with hemostasis in the right groin obtained with a 6 French Angio-Seal closure device, and manual compression with a quick clot for 15 minutes. Distal pulses remained palpable in all feet unchanged. A CT of the brain obtained demonstrated no evidence of mass effect or midline shift. There was mild hyperattenuation in the subarachnoid space in the left perisylvian area. Trial of extubation was unsuccessful as the patient was unresponsive. Patient was, therefore, left intubated and transferred to the neuro ICU for post revascularization care. IMPRESSION: Status post endovascular complete  revascularization of occluded inferior division of the left middle cerebral artery with 1 pass with a 3 mm x 40 mm Solitaire X retrieval device, and contact aspiration achieving a TICI 3 revascularization. PLAN: Follow-up as per referring MD. Electronically Signed   By: Luanne Bras  M.D.   On: 01/28/2021 08:42   CT HEAD CODE STROKE WO CONTRAST  Result Date: 01/23/2021 CLINICAL DATA:  Code stroke. Neuro deficit, acute, stroke suspected. EXAM: CT HEAD WITHOUT CONTRAST TECHNIQUE: Contiguous axial images were obtained from the base of the skull through the vertex without intravenous contrast. COMPARISON:  None. FINDINGS: Brain: No abnormality of the brainstem or cerebellum. Equivocal loss of gray-white differentiation in the left temporal lobe. No other brain parenchymal finding. Vascular: Apparent hyperdense left M1 suggest embolic disease. Skull: Normal Sinuses/Orbits: Clear/normal Other: None ASPECTS ([place] Stroke Program Early CT Score) - Ganglionic level infarction (caudate, lentiform nuclei, internal capsule, insula, M1-M3 cortex): 6 - Supraganglionic infarction (M4-M6 cortex): 3 Total score (0-10 with 10 being normal): 9 IMPRESSION: 1. Question loss of gray-white differentiation in the left temporal lobe. Otherwise normal appearance of the brain parenchyma. Apparent hyperdense left M1 suggesting embolic disease. 2. ASPECTS is 9 3. These results were communicated to Dr. Theda Sers at 3:19 pm on 01/23/2021 by text page via the [facility] messaging system. Electronically Signed   By: Nelson Chimes M.D.   On: 01/23/2021 15:19   VAS [place] LOWER EXTREMITY VENOUS (DVT)  Result Date: 01/24/2021  Lower Venous DVT Study Patient Name:  [name]  Date of Exam:   01/24/2021 Medical Rec #: [id number]        Accession #:    [id number] Date of Birth: 1970/02/26       Patient Gender: F Patient Age:   22 years Exam Location:  [facility] Procedure:      VAS [place] LOWER EXTREMITY VENOUS (DVT) Referring Phys:  Cornelius Moras XU --------------------------------------------------------------------------------  Indications: Stroke.  Risk Factors: None identified. Limitations: Poor ultrasound/tissue interface and bandages. Comparison Study: No prior studies. Performing Technologist: Oliver Hum RVT  Examination Guidelines: A complete evaluation includes B-mode imaging, spectral Doppler, color Doppler, and power Doppler as needed of all accessible portions of each vessel. Bilateral testing is considered an integral part of a complete examination. Limited examinations for reoccurring indications may be performed as noted. The reflux portion of the exam is performed with the patient in reverse Trendelenburg.  +---------+---------------+---------+-----------+----------+--------------+  RIGHT     Compressibility Phasicity Spontaneity Properties Thrombus Aging  +---------+---------------+---------+-----------+----------+--------------+  CFV       Full            Yes       Yes                                    +---------+---------------+---------+-----------+----------+--------------+  SFJ       Full                                                             +---------+---------------+---------+-----------+----------+--------------+  FV Prox   Full                                                             +---------+---------------+---------+-----------+----------+--------------+  FV Mid    Full                                                             +---------+---------------+---------+-----------+----------+--------------+  FV Distal Full                                                             +---------+---------------+---------+-----------+----------+--------------+  PFV       Full                                                             +---------+---------------+---------+-----------+----------+--------------+  POP       Partial         Yes       Yes                    Acute            +---------+---------------+---------+-----------+----------+--------------+  PTV       Full                                                             +---------+---------------+---------+-----------+----------+--------------+  PERO      Full                                                             +---------+---------------+---------+-----------+----------+--------------+  Soleal    None                                             Acute           +---------+---------------+---------+-----------+----------+--------------+  Gastroc   None                                             Acute           +---------+---------------+---------+-----------+----------+--------------+   +---------+---------------+---------+-----------+----------+--------------+  LEFT      Compressibility Phasicity Spontaneity Properties Thrombus Aging  +---------+---------------+---------+-----------+----------+--------------+  CFV       Full            Yes       Yes                                    +---------+---------------+---------+-----------+----------+--------------+  SFJ       Full                                                             +---------+---------------+---------+-----------+----------+--------------+  FV Prox   Full                                                             +---------+---------------+---------+-----------+----------+--------------+  FV Mid    Full                                                             +---------+---------------+---------+-----------+----------+--------------+  FV Distal Full                                                             +---------+---------------+---------+-----------+----------+--------------+  PFV       Full                                                             +---------+---------------+---------+-----------+----------+--------------+  POP       Full            Yes       Yes                                     +---------+---------------+---------+-----------+----------+--------------+  PTV       Full                                                             +---------+---------------+---------+-----------+----------+--------------+  PERO      Full                                                             +---------+---------------+---------+-----------+----------+--------------+  Gastroc   None                                             Acute           +---------+---------------+---------+-----------+----------+--------------+     Summary: RIGHT: - Findings consistent with acute deep vein thrombosis involving the right popliteal vein, right soleal veins, and right gastrocnemius veins. - No cystic structure found in the popliteal fossa.  LEFT: - Findings consistent with acute deep vein thrombosis involving the left gastrocnemius veins. - No cystic structure found in the popliteal fossa.  *See table(s) above for measurements and  observations. Electronically signed by Jamelle Haring on 01/24/2021 at 3:34:06 PM.    Final    CT ANGIO HEAD CODE STROKE  Result Date: 01/23/2021 CLINICAL DATA:  Acute deficit, stroke suspected. Abnormal head CT. Left hemisphere insult. EXAM: CT ANGIOGRAPHY HEAD AND NECK TECHNIQUE: Multidetector CT imaging of the head and neck was performed using the standard protocol during bolus administration of intravenous contrast. Multiplanar CT image reconstructions and MIPs were obtained to evaluate the vascular anatomy. Carotid stenosis measurements (when applicable) are obtained utilizing NASCET criteria, using the distal internal carotid diameter as the denominator. CONTRAST:  25m OMNIPAQUE IOHEXOL 350 MG/ML SOLN COMPARISON:  Head CT earlier same day FINDINGS: CTA NECK FINDINGS Aortic arch: Normal Right carotid system: Common carotid artery widely patent to the bifurcation. Calcified plaque at the carotid bifurcation but no stenosis. Cervical ICA is widely patent. Left carotid system: Common  carotid artery widely patent to the bifurcation. Calcified plaque at the carotid bifurcation and ICA bulb but no stenosis. Cervical ICA widely patent beyond that. Vertebral arteries: Both vertebral artery origins are widely patent. The left is dominant. Both vertebral arteries are patent through the cervical region to the foramen magnum. Skeleton: Scoliotic curvature which could be positional. No acute finding. Other neck: No soft tissue mass or lymphadenopathy. Upper chest: Negative Review of the MIP images confirms the above findings CTA HEAD FINDINGS Anterior circulation: Both internal carotid arteries are patent through the skull base and siphon region. On the right, the anterior and middle cerebral vessels are patent without large vessel occlusion or stenosis. On the left, the anterior cerebral artery is widely patent. There is an embolus within the M1 segment extending over a length of about 8 mm. This corresponds with the hyperdense vessel on the CT. This results in marked stenosis of the vessel but not complete occlusion. More distal MCA branch vessels do show flow. There is fetal origin of the left PCA which appears widely patent. Posterior circulation: Both vertebral arteries widely patent to the basilar. No basilar stenosis. Posterior circulation branch vessels are patent. Venous sinuses: Patent and normal. Anatomic variants: None significant. Review of the MIP images confirms the above findings IMPRESSION: Acute embolus to the left M1 segment extending over length of about 8 mm. Small amount of flow within the left MCA does opacify the more distal branch vessels. Atherosclerotic calcification at both carotid bifurcations but without measurable stenosis. Results discussed with Dr. CTheda Sersduring interpretation at 1531 hours. Electronically Signed   By: MNelson ChimesM.D.   On: 01/23/2021 15:36   CT ANGIO NECK CODE STROKE  Result Date: 01/23/2021 CLINICAL DATA:  Acute deficit, stroke suspected.  Abnormal head CT. Left hemisphere insult. EXAM: CT ANGIOGRAPHY HEAD AND NECK TECHNIQUE: Multidetector CT imaging of the head and neck was performed using the standard protocol during bolus administration of intravenous contrast. Multiplanar CT image reconstructions and MIPs were obtained to evaluate the vascular anatomy. Carotid stenosis measurements (when applicable) are obtained utilizing NASCET criteria, using the distal internal carotid diameter as the denominator. CONTRAST:  751mOMNIPAQUE IOHEXOL 350 MG/ML SOLN COMPARISON:  Head CT earlier same day FINDINGS: CTA NECK FINDINGS Aortic arch: Normal Right carotid system: Common carotid artery widely patent to the bifurcation. Calcified plaque at the carotid bifurcation but no stenosis. Cervical ICA is widely patent. Left carotid system: Common carotid artery widely patent to the bifurcation. Calcified plaque at the carotid bifurcation and ICA bulb but no stenosis. Cervical ICA widely patent beyond that. Vertebral arteries: Both vertebral artery  origins are widely patent. The left is dominant. Both vertebral arteries are patent through the cervical region to the foramen magnum. Skeleton: Scoliotic curvature which could be positional. No acute finding. Other neck: No soft tissue mass or lymphadenopathy. Upper chest: Negative Review of the MIP images confirms the above findings CTA HEAD FINDINGS Anterior circulation: Both internal carotid arteries are patent through the skull base and siphon region. On the right, the anterior and middle cerebral vessels are patent without large vessel occlusion or stenosis. On the left, the anterior cerebral artery is widely patent. There is an embolus within the M1 segment extending over a length of about 8 mm. This corresponds with the hyperdense vessel on the CT. This results in marked stenosis of the vessel but not complete occlusion. More distal MCA branch vessels do show flow. There is fetal origin of the left PCA which appears  widely patent. Posterior circulation: Both vertebral arteries widely patent to the basilar. No basilar stenosis. Posterior circulation branch vessels are patent. Venous sinuses: Patent and normal. Anatomic variants: None significant. Review of the MIP images confirms the above findings IMPRESSION: Acute embolus to the left M1 segment extending over length of about 8 mm. Small amount of flow within the left MCA does opacify the more distal branch vessels. Atherosclerotic calcification at both carotid bifurcations but without measurable stenosis. Results discussed with Dr. Theda Sers during interpretation at 1531 hours. Electronically Signed   By: Nelson Chimes M.D.   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 Patient received TN K and thrombectomy. Significant improvement in her right-sided weakness and aphasia. Follows with neurology Dr. Erlinda Hong and is to see Dr. Leonie Man in clinic. 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 Neurology suggests that they feel the stroke could be paradoxical embolization from her DVT passing through her PFO.  #2 PFO Patient will follow up with cardiology t Dr. Burt Knack for further evaluation and determination regarding PFO closure.  #3 bilateral lower extremity DVT.  Incidentally noted during work-up for possible etiology of CVA. Patient was not symptomatic from these and does not have any overt provoking events for these findings. She was noted to have these soon after hospitalization so it is less likely that these were triggered by her stroke and bedrest and more likely to have been present prior prior to this.  Plan -Patient's chart and progress during her inpatient hospitalization and  subsequently were reviewed in detail. -We discussed her previous history in detail and she does not seem to have any obvious provoking events for her bilateral lower extremity DVT. -We discussed results of the hypercoagulability work-up that she had sent out while she was admitted to the hospital which was unrevealing and was negative for antiphospholipid antibodies, factor V Leiden mutation, prothrombin gene mutation.  Normal protein C and protein S levels and Antithrombin III levels. -She is not currently on any antiplatelet therapy or anticoagulation as per neurology due to her ischemic CVA with hemorrhagic transformation. -We discussed that irrespective of her hypercoagulability work-up given the unprovoked nature of her bilateral lower extremity DVT there would be a strong consideration towards long-term anticoagulation. -Given her recent hemorrhagic transformation of the CVA we would have to await neurology clearance prior to starting her on  anticoagulation. -Depending on her CNS rebleed risk we could either start her on Eliquis 2.5 mg p.o. twice daily for a week or 2 and then proceed to 5 mg p.o. twice daily.  If the bleeding risk is limited at this time she could alternatively be started on Eliquis 5 mg p.o. twice daily without the loading dose. -Once the patient is stable on anticoagulation for 1 to 2 months if there is no indication of bleeding her IVC filter could potentially be removed.  This decision will need to be made after final word from cardiology regarding their plan for PFO closure.  If PFO closure is planned would await PFO closure prior to removal of IVC filter. -We shall follow-up with patient to review her remaining hypercoagulability work-up once results available.  #4 history of B12 deficiency -We will check parietal cell and intrinsic factor antibodies to rule out pernicious anemia Continue B12 replacement . Orders Placed This Encounter  Procedures   CBC with  Differential/Platelet    Standing Status:   Future    Number of Occurrences:   1    Standing Expiration Date:   02/16/2022   JAK2 (including V617F and Exon 12), MPL, and CALR-Next Generation Sequencing    Standing Status:   Future    Number of Occurrences:   1    Standing Expiration Date:   02/16/2022   D-dimer, quantitative    Standing Status:   Future    Number of Occurrences:   1    Standing Expiration Date:   02/16/2022   Multiple Myeloma Panel (SPEP&IFE w/QIG)    Standing Status:   Future    Number of Occurrences:   1    Standing Expiration Date:   02/16/2022   Kappa/lambda light chains    Standing Status:   Future    Number of Occurrences:   1    Standing Expiration Date:   02/16/2022   Plasminogen Activator Inhibitor1 4G/5G Polymorphism    Standing Status:   Future    Number of Occurrences:   1    Standing Expiration Date:   02/16/2022   Factor 8 assay    Standing Status:   Future    Number of Occurrences:   1    Standing Expiration Date:   02/16/2022   Vitamin B12    Standing Status:   Future    Number of Occurrences:   1    Standing Expiration Date:   02/16/2022   Intrinsic factor antibodies    Standing Status:   Future    Number of Occurrences:   1    Standing Expiration Date:   02/16/2022   Anti-parietal antibody    Standing Status:   Future    Number of Occurrences:   1    Standing Expiration Date:   02/16/2022    All of the patients questions were answered with apparent satisfaction. The patient knows to call the clinic with any problems, questions or concerns.  I spent 45 mins counseling the patient face to face. The total time spent in the appointment was 60 mins and more than 50% was on counseling and direct patient cares.    Sullivan Lone MD [place] AAHIVMS [facility] Hematology/Oncology [facility]  (Office):       [phone] (Work cell):  [phone] (Fax):           [phone]  02/16/2021 11:38 AM

## 2021-02-17 ENCOUNTER — Encounter: Payer: Self-pay | Admitting: Rehabilitation

## 2021-02-17 ENCOUNTER — Ambulatory Visit: Payer: Federal, State, Local not specified - PPO | Admitting: Rehabilitation

## 2021-02-17 ENCOUNTER — Other Ambulatory Visit: Payer: Self-pay

## 2021-02-17 DIAGNOSIS — R2689 Other abnormalities of gait and mobility: Secondary | ICD-10-CM | POA: Diagnosis not present

## 2021-02-17 DIAGNOSIS — R2681 Unsteadiness on feet: Secondary | ICD-10-CM

## 2021-02-17 DIAGNOSIS — M6281 Muscle weakness (generalized): Secondary | ICD-10-CM

## 2021-02-17 DIAGNOSIS — I639 Cerebral infarction, unspecified: Secondary | ICD-10-CM | POA: Diagnosis not present

## 2021-02-17 LAB — INTRINSIC FACTOR ANTIBODIES: Intrinsic Factor: 1 AU/mL (ref 0.0–1.1)

## 2021-02-17 LAB — ANTI-PARIETAL ANTIBODY: Parietal Cell Antibody-IgG: 1.5 Units (ref 0.0–20.0)

## 2021-02-17 LAB — KAPPA/LAMBDA LIGHT CHAINS
Kappa free light chain: 22.8 mg/L — ABNORMAL HIGH (ref 3.3–19.4)
Kappa, lambda light chain ratio: 1.59 (ref 0.26–1.65)
Lambda free light chains: 14.3 mg/L (ref 5.7–26.3)

## 2021-02-17 NOTE — Therapy (Signed)
[facility] [address] [place], [place], [address] Phone: [phone]   Fax:  [phone]  Physical Therapy Treatment  Patient Details  Name: [name] MRN: [id number] Date of Birth: July 25, 1970 Referring Provider (PT): Rosalin Hawking, MD   Encounter Date: 02/17/2021   PT End of Session - 02/17/21 1142     Visit Number 5    Number of Visits 17    Date for PT Re-Evaluation 04/03/21    Authorization Type BCBS    Authorization - Visit Number 3    Authorization - Number of Visits 50    PT Start Time 2409    PT Stop Time 1100    PT Time Calculation (min) 42 min    Equipment Utilized During Treatment Gait belt    Activity Tolerance Patient tolerated treatment well    Behavior During Therapy [facility] for tasks assessed/performed             History reviewed. No pertinent past medical history.  Past Surgical History:  Procedure Laterality Date   BUBBLE STUDY  01/28/2021   Procedure: BUBBLE STUDY;  Surgeon: Pixie Casino, MD;  Location: [place];  Service: Cardiovascular;;   IR CT HEAD LTD  01/23/2021   IR IVC FILTER PLMT / S&I Burke Keels GUID/MOD SED  01/28/2021   IR PERCUTANEOUS ART THROMBECTOMY/INFUSION INTRACRANIAL INC DIAG ANGIO  01/23/2021   RADIOLOGY WITH ANESTHESIA N/A 01/23/2021   Procedure: IR WITH ANESTHESIA;  Surgeon: Radiologist, Medication, MD;  Location: [place];  Service: Radiology;  Laterality: N/A;   TEE WITHOUT CARDIOVERSION N/A 01/28/2021   Procedure: TRANSESOPHAGEAL ECHOCARDIOGRAM (TEE);  Surgeon: Pixie Casino, MD;  Location: [facility] ENDOSCOPY;  Service: Cardiovascular;  Laterality: N/A;    There were no vitals filed for this visit.   Subjective Assessment - 02/17/21 1023     Subjective Pt reports doing well, sees neurology next week. Hoping to be cleared for work    Patient Stated Goals Get back to work    Currently in Pain? Yes    Pain Score 2     Pain Location Hip    Pain Orientation  Left    Pain Descriptors / Indicators Aching    Pain Type Acute pain    Pain Onset 1 to 4 weeks ago    Pain Frequency Intermittent    Aggravating Factors  first thing in the morning    Pain Relieving Factors injection and stretches                               [facility] Adult PT Treatment/Exercise - 02/17/21 1037       Transfers   Transfers Sit to Stand;Stand to Sit    Sit to Stand 6: Modified independent (Device/Increase time)    Five time sit to stand comments  8.18 secs    Stand to Sit 6: Modified independent (Device/Increase time)      Ambulation/Gait   Ambulation/Gait Yes    Ambulation/Gait Assistance 6: Modified independent (Device/Increase time)    Ambulation/Gait Assistance Details throughout session    Ambulation Distance (Feet) 500 Feet   plus   Assistive device None    Gait Pattern Step-through pattern;Decreased arm swing - right;Right flexed knee in stance;Left flexed knee in stance;Right hip hike    Ambulation Surface Level;Indoor    Gait velocity 4.3 ft/sec    Stairs Yes    Stairs Assistance 6: Modified independent (Device/Increase time)  Stair Management Technique No rails;Alternating pattern;Forwards    Number of Stairs 12    Height of Stairs 6      Therapeutic Activites    Therapeutic Activities Other Therapeutic Activities    Other Therapeutic Activities Performed work simulation with going from standing to half kneel>tall kneel>half kneel and stand.  Did well from R LE, however did need to use mat for support when rising from LLE.  Progressed to picking items up from ground and placing back to ground up to 5lbs. Also had her quickly transition from "station" to station picking up an item from varying locations taking them to a different location and placing somewhere else x 300'.  Discussed having her do stand>tall>half kneel>stand transfers at home with couch beside of her to address strength deficits.  Pt verbalized understanding.  Also had  her perform stairs as mentioned above for LE strengthening.  Again, recommend she do at home.      Neuro Re-ed    Neuro Re-ed Details  Resisted gait x 230'with PT providing resistant at all times but also intermittently and randomly providing lateral and posterior perturbations for more reactionary balance challenges.      Exercises   Other Exercises  Performed slow step downs from 6" step with light UE support lowering whole foot lightly before elevating back to upright extension.  Performed x 10 reps on each side.               NMR: tandem gait on balance beam x 4 laps progressing to tandem gait with lateral cone taps x 4 laps.  Stepping to targets on floor called out in random order by PT with R and L LE, including posterior and crossing midline.  No overt LOB noted.        PT Education - 02/17/21 1140     Education Details Discussed that from a mobility standpoint she is ready for work, will plan to check remaining goals next visit and D/C.    Person(s) Educated Patient    Methods Explanation    Comprehension Verbalized understanding              PT Short Term Goals - 02/17/21 1055       PT SHORT TERM GOAL #1   Title Pt will initiate HEP in order to indicate improved functional mobility and balance (Target Date: 03/04/21)    Baseline met    Time 4    Period Weeks    Status Achieved    Target Date 03/04/21      PT SHORT TERM GOAL #2   Title Pt will perform 5TSS in </=11.5 secs without UE support in order to indicate improved functional LE strength.    Baseline 8.18 secs without UE support    Time 4    Period Weeks    Status Achieved      PT SHORT TERM GOAL #3   Title Pt will improve FGA to >/=25/30 in order to indicate dec fall risk.    Time 4    Period Weeks    Status New      PT SHORT TERM GOAL #4   Title Pt will improve gait speed to >/=3.0 ft/sec in order to indicate improved community ambulation.    Baseline 4.3 ft/sec without device    Time 4     Period Weeks    Status Achieved      PT SHORT TERM GOAL #5   Title Pt will demonstrate improved hamstring and  hip flexibility and therefore state no more than 4/10 pain with transitional movements.    Time 4    Period Weeks    Status Achieved               PT Long Term Goals - 02/17/21 1054       PT LONG TERM GOAL #1   Title Pt will be IND with final HEP and walking program in order to indicate improved functional mobility and dec fall risk. (Target Date: 04/03/21)    Baseline met    Time 8    Period Weeks    Status Achieved    Target Date 04/03/21      PT LONG TERM GOAL #2   Title Pt will improve FGA to >/=28/30 in order to indicate dec fall risk and safe return to work.    Time 8    Period Weeks    Status New      PT LONG TERM GOAL #3   Title Pt will ambulate x 1000' over varying outdoor surfaces at independent level in order to indicate safe return to community and leisure activities.    Time 8    Period Weeks    Status New      PT LONG TERM GOAL #4   Title Pt will demonstrate ability to perform transitional movements (floor to seating surface, sit<>stand, supine<>sit) with no more than 2/10 pain in order to indicate improved flexibility.    Baseline performed today without increase in pain    Time 8    Period Weeks    Status Achieved      PT LONG TERM GOAL #5   Title Patient will improve Physical FS Primary Measure (in FOTO) to 77 in order to indicate improved self reported function.    Time 8    Period Weeks    Status New                   Plan - 02/17/21 1142     Clinical Impression Statement Skilled session focused on performing more work type skills as she is likely going to be released to return to work when she sees Dr. Leonie Man next week.  Feel that we can wrap up therapy on Monday checking her remaining goals.  Pt in agreement and verbalized understanding.    Personal Factors and Comorbidities Age;Comorbidity 3+    Comorbidities see above     Examination-Activity Limitations Locomotion Level;Sit;Squat;Stairs;Stand;Transfers    Examination-Participation Restrictions Community Activity;Driving;Occupation;Shop    Stability/Clinical Decision Making Evolving/Moderate complexity    Rehab Potential Good    PT Frequency 2x / week    PT Duration 8 weeks   may be able to D/c before this   PT Treatment/Interventions ADLs/Self Care Home Management;Aquatic Therapy;Gait training;Stair training;Functional mobility training;Therapeutic activities;Therapeutic exercise;Balance training;Neuromuscular re-education;Patient/family education;Passive range of motion;Vestibular    PT Next Visit Plan Probably won't need a whole session to check remaining goals.  Send to Cameron Sprang to do D/C    Consulted and Agree with Plan of Care Patient             Patient will benefit from skilled therapeutic intervention in order to improve the following deficits and impairments:  Abnormal gait, Decreased activity tolerance, Decreased balance, Decreased endurance, Decreased mobility, Decreased range of motion, Decreased strength, Difficulty walking, Impaired perceived functional ability, Impaired flexibility, Postural dysfunction  Visit Diagnosis: Unsteadiness on feet  Muscle weakness (generalized)  Other abnormalities of gait and mobility  Problem List Patient Active Problem List   Diagnosis Date Noted   DVT, bilateral lower limbs ([place]) s/p IVC filter placement 01/29/2021   PFO (patent foramen ovale) 01/29/2021   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   Stroke ([place]) 01/23/2021   left MCA scattered infarct s/p TNK, likely secondary b/l DVTs in the setting of PFO 01/23/2021   OVERWEIGHT/OBESITY 06/19/2008   VITAMIN B12 DEFICIENCY 05/05/2007   HYPERCHOLESTEROLEMIA 05/05/2007   IRRITABLE BOWEL SYNDROME, HX OF 05/05/2007    Denice Bors, PT 02/17/2021, 11:48 AM  [facility] [address] [place], [place], [address] Phone: [phone]   Fax:  [phone]  Name: ARVILLA SALADA MRN: [id number] Date of Birth: [birth year]

## 2021-02-18 ENCOUNTER — Encounter: Payer: Self-pay | Admitting: Neurology

## 2021-02-18 LAB — FACTOR 8 ASSAY: Coagulation Factor VIII: 211 % — ABNORMAL HIGH (ref 56–140)

## 2021-02-22 ENCOUNTER — Encounter: Payer: Self-pay | Admitting: Physical Therapy

## 2021-02-22 ENCOUNTER — Ambulatory Visit: Payer: Federal, State, Local not specified - PPO | Admitting: Physical Therapy

## 2021-02-22 ENCOUNTER — Other Ambulatory Visit: Payer: Self-pay

## 2021-02-22 ENCOUNTER — Ambulatory Visit: Payer: Federal, State, Local not specified - PPO | Admitting: Cardiovascular Disease

## 2021-02-22 ENCOUNTER — Encounter: Payer: Self-pay | Admitting: Cardiovascular Disease

## 2021-02-22 VITALS — BP 114/72 | HR 82 | Ht 63.0 in | Wt 182.0 lb

## 2021-02-22 DIAGNOSIS — I824Z3 Acute embolism and thrombosis of unspecified deep veins of distal lower extremity, bilateral: Secondary | ICD-10-CM | POA: Diagnosis not present

## 2021-02-22 DIAGNOSIS — I639 Cerebral infarction, unspecified: Secondary | ICD-10-CM | POA: Diagnosis not present

## 2021-02-22 DIAGNOSIS — D6859 Other primary thrombophilia: Secondary | ICD-10-CM

## 2021-02-22 DIAGNOSIS — M6281 Muscle weakness (generalized): Secondary | ICD-10-CM | POA: Diagnosis not present

## 2021-02-22 DIAGNOSIS — R2689 Other abnormalities of gait and mobility: Secondary | ICD-10-CM

## 2021-02-22 DIAGNOSIS — Q2112 Patent foramen ovale: Secondary | ICD-10-CM

## 2021-02-22 DIAGNOSIS — R2681 Unsteadiness on feet: Secondary | ICD-10-CM | POA: Diagnosis not present

## 2021-02-22 LAB — MULTIPLE MYELOMA PANEL, SERUM
Albumin SerPl Elph-Mcnc: 3.8 g/dL (ref 2.9–4.4)
Albumin/Glob SerPl: 1 (ref 0.7–1.7)
Alpha 1: 0.3 g/dL (ref 0.0–0.4)
Alpha2 Glob SerPl Elph-Mcnc: 1 g/dL (ref 0.4–1.0)
B-Globulin SerPl Elph-Mcnc: 1.5 g/dL — ABNORMAL HIGH (ref 0.7–1.3)
Gamma Glob SerPl Elph-Mcnc: 1.3 g/dL (ref 0.4–1.8)
Globulin, Total: 4 g/dL — ABNORMAL HIGH (ref 2.2–3.9)
IgA: 273 mg/dL (ref 87–352)
IgG (Immunoglobin G), Serum: 1236 mg/dL (ref 586–1602)
IgM (Immunoglobulin M), Srm: 89 mg/dL (ref 26–217)
Total Protein ELP: 7.8 g/dL (ref 6.0–8.5)

## 2021-02-22 NOTE — Progress Notes (Signed)
Cardiology Office Note:    Date:  02/22/2021   ID:  [name], DOB May 28, 1970, MRN [id number]  PCP:  Gail Blamer, MD   [facility] Providers Cardiologist:  None     Referring MD: Gail Blamer, MD   Chief Complaint  Patient presents with   PFO    History of Present Illness:    [name] is a 51 y.o. female referred for evaluation of PFO closure.   The patient was hospitalized in December 2022 with a left MCA stroke treated with TNKase and thrombectomy.  Unfortunately the patient experienced some hemorrhagic transformation and anticoagulation/antiplatelet therapy was contraindicated.  She was found to have bilateral DVT and a PFO, with a clinical scenario highly suggestive of paradoxical embolus.  There were no clear inciting factors for DVT.  Patient had no history of clotting events, prior stroke, myocardial infarction, or bleeding.  An echo but with bubble study showed evidence of right to left intracardiac shunting.  She ultimately underwent transesophageal echo that showed a PFO with both color flow and agitated saline crossing the interatrial septum at the level of the PFO.  The patient has subsequently been seen by Dr. Candise Martinez for hematology evaluation.  His work-up is currently in progress.  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  She informs me that she likely will require lifelong anticoagulation.  From a stroke perspective, she has just completed physical therapy and she is feeling back to her baseline.  She is doing quite well.  Of note, an IVC filter was placed during her hospitalization.  She has not been started on any anticoagulant therapy yet.  She sees Dr. Pearlean Martinez in follow-up in a few days.  Past Medical History:  Diagnosis Date   Hyperlipidemia    Hypertension    Stroke [facility])     Past Surgical History:  Procedure Laterality Date   BUBBLE STUDY  01/28/2021   Procedure: BUBBLE STUDY;  Surgeon: Chrystie Nose, MD;  Location: [facility]  ENDOSCOPY;  Service: Cardiovascular;;   IR CT HEAD LTD  01/23/2021   IR IVC FILTER PLMT / S&I Lenise Arena GUID/MOD SED  01/28/2021   IR PERCUTANEOUS ART THROMBECTOMY/INFUSION INTRACRANIAL INC DIAG ANGIO  01/23/2021   RADIOLOGY WITH ANESTHESIA N/A 01/23/2021   Procedure: IR WITH ANESTHESIA;  Surgeon: Radiologist, Medication, MD;  Location: [facility] OR;  Service: Radiology;  Laterality: N/A;   TEE WITHOUT CARDIOVERSION N/A 01/28/2021   Procedure: TRANSESOPHAGEAL ECHOCARDIOGRAM (TEE);  Surgeon: Chrystie Nose, MD;  Location: [facility] ENDOSCOPY;  Service: Cardiovascular;  Laterality: N/A;    Current Medications: Current Meds  Medication Sig   atorvastatin (LIPITOR) 40 MG tablet Take 1 tablet (40 mg total) by mouth daily.   gabapentin (NEURONTIN) 100 MG capsule Take 100 mg by mouth every evening.   lisinopril-hydrochlorothiazide (ZESTORETIC) 10-12.5 MG tablet Take 1 tablet by mouth every evening.   metFORMIN (GLUCOPHAGE-XR) 500 MG 24 hr tablet Take 500 mg by mouth daily with supper.   triamcinolone cream (KENALOG) 0.1 % Apply 1 application topically 2 (two) times daily as needed.     Allergies:   Patient has no known allergies.   Social History   Socioeconomic History   Marital status: Married    Spouse name: Not on file   Number of children: Not on file   Years of education: Not on file   Highest education level: Not on file  Occupational History   Not on file  Tobacco Use   Smoking status:  Never   Smokeless tobacco: Never  Vaping Use   Vaping Use: Not on file  Substance and Sexual Activity   Alcohol use: Not on file   Drug use: Not on file   Sexual activity: Not on file  Other Topics Concern   Not on file  Social History Narrative   Not on file   Social Determinants of Health   Financial Resource Strain: Not on file  Food Insecurity: Not on file  Transportation Needs: Not on file  Physical Activity: Not on file  Stress: Not on file  Social Connections: Not on file     Family  History: The patient's family history includes Atrial fibrillation in her mother; Heart attack in her mother; Heart disease in her father; Stroke in her maternal grandfather.  ROS:   Please see the history of present illness.    All other systems reviewed and are negative.  EKGs/Labs/Other Studies Reviewed:    The following studies were reviewed today: Echo 01/24/2021: 1. Left ventricular ejection fraction, by estimation, is 65 to 70%. The  left ventricle has normal function. The left ventricle has no regional  wall motion abnormalities. Left ventricular diastolic parameters are  consistent with Grade I diastolic  dysfunction (impaired relaxation).   2. Right ventricular systolic function is normal. The right ventricular  size is normal. There is mildly elevated pulmonary artery systolic  pressure.   3. Left atrial size was mildly dilated.   4. The mitral valve is normal in structure. No evidence of mitral valve  regurgitation. No evidence of mitral stenosis.   5. The aortic valve is normal in structure. There is mild calcification  of the aortic valve. Aortic valve regurgitation is not visualized. No  aortic stenosis is present.   6. The inferior vena cava is normal in size with greater than 50%  respiratory variability, suggesting right atrial pressure of 3 mmHg.   7. Agitated saline contrast bubble study was positive with shunting  observed within 3-6 cardiac cycles suggestive of interatrial shunt. The  bubble study was mildly positive on the first few beats but markedly  positive on the 5th beat. Suspect  intracardiac shunting but may also have extracardai source. Suggest TEE to  further evaluate, if clinically indicated.   TEE: 1. Left ventricular ejection fraction, by estimation, is 55 to 60%. The  left ventricle has normal function. The left ventricle has no regional  wall motion abnormalities.   2. Right ventricular systolic function is normal. The right ventricular   size is normal.   3. No left atrial/left atrial appendage thrombus was detected.   4. The mitral valve is normal in structure. No evidence of mitral valve  regurgitation.   5. The aortic valve is tricuspid. Aortic valve regurgitation is trivial.   6. Evidence of atrial level shunting detected by color flow Doppler.  Agitated saline contrast bubble study was positive with shunting observed  within 3-6 cardiac cycles suggestive of interatrial shunt. There is a  small patent foramen ovale with  bidirectional shunting across atrial septum.   Conclusion(s)/Recommendation(s): Findings are concerning for an  interatrial shunt as detailed above. Consider outpatient PFO closure after  recovery from intracerebral hemorrhage.   EKG:  EKG is ordered today.  The ekg ordered today demonstrates normal sinus rhythm 82 bpm, within normal limits.  Recent Labs: 01/23/2021: ALT 26 01/24/2021: TSH 0.354 01/29/2021: BUN 13; Creatinine, Ser 0.66; Potassium 3.6; Sodium 134 02/16/2021: Hemoglobin 12.4; Platelets 349  Recent Lipid Panel  Component Value Date/Time   CHOL 129 01/24/2021 0509   TRIG 114 01/24/2021 0509   TRIG 112 01/24/2021 0509   HDL 33 (L) 01/24/2021 0509   CHOLHDL 3.9 01/24/2021 0509   VLDL 23 01/24/2021 0509   LDLCALC 73 01/24/2021 0509   LDLDIRECT 154.0 12/28/2009 0925     Risk Assessment/Calculations:           Physical Exam:    VS:  BP 114/72    Pulse 82    Ht 5\' 3"  (1.6 m)    Wt 182 lb (82.6 kg)    SpO2 96%    BMI 32.24 kg/m     Wt Readings from Last 3 Encounters:  02/22/21 182 lb (82.6 kg)  02/16/21 184 lb 12.8 oz (83.8 kg)  01/28/21 190 lb 7.6 oz (86.4 kg)     GEN:  Well nourished, well developed in no acute distress HEENT: Normal NECK: No JVD; No carotid bruits LYMPHATICS: No lymphadenopathy CARDIAC: RRR, no murmurs, rubs, gallops RESPIRATORY:  Clear to auscultation without rales, wheezing or rhonchi  ABDOMEN: Soft, non-tender,  non-distended MUSCULOSKELETAL:  No edema; No deformity  SKIN: Warm and dry NEUROLOGIC:  Alert and oriented x 3 PSYCHIATRIC:  Normal affect   ASSESSMENT:    1. PFO (patent foramen ovale)   2. Hypercoagulable state (HCC)   3. Acute deep vein thrombosis (DVT) of distal vein of both lower extremities (HCC)    PLAN:    In order of problems listed above:  I personally reviewed the patient's TEE images.  She has a moderate sized PFO with color flow across the defect and positive bubble study indicating at least a moderate size shunt.  She is found to have an embolic stroke in the context of acute bilateral DVT.  This clinical scenario is highly suggestive of paradoxical embolus in the presence of a patent foramen ovale.  We discussed clinical issues around PFO treatment today.  I reviewed the PFO closure procedure with the patient and her husband.  In the setting of a probable hypercoagulable state, it sounds like the patient will require lifelong anticoagulation.  If this is the case, I am inclined to avoid transcatheter PFO closure.  The patient's risk of venous thromboembolism would be minimized with oral anticoagulation.  In addition, there is some concern about an intracardiac device that could be a thrombogenic surface in a patient with a hypercoagulable disorder.  I will confer with both Dr. Pearlean BrownieSethi and Dr. Candise CheKale to get their input as well.  The patient's PFO anatomy is favorable for transcatheter closure, but the presence of a hypercoagulable disorder makes me favor oral anticoagulation rather than device closure.  I would be happy to see the patient back in the future if her circumstances change.      Medication Adjustments/Labs and Tests Ordered: Current medicines are reviewed at length with the patient today.  Concerns regarding medicines are outlined above.  Orders Placed This Encounter  Procedures   EKG 12-Lead   No orders of the defined types were placed in this encounter.   Patient  Instructions  Medication Instructions:  NONE *If you need a refill on your cardiac medications before your next appointment, please call your pharmacy*   Lab Work: NONE If you have labs (blood work) drawn today and your tests are completely normal, you will receive your results only by: MyChart Message (if you have MyChart) OR A paper copy in the mail If you have any lab test that is abnormal or  we need to change your treatment, we will call you to review the results.   Testing/Procedures: NONE   Follow-Up: At [facility], you and your health needs are our priority.  As part of our continuing mission to provide you with exceptional heart care, we have created designated Provider Care Teams.  These Care Teams include your primary Cardiologist (physician) and Advanced Practice Providers (APPs -  Physician Assistants and Nurse Practitioners) who all work together to provide you with the care you need, when you need it.  Your next appointment:    Call as needed      Signed, Tonny BollmanMichael Laural Eiland, MD  02/22/2021 2:23 PM    [facility]

## 2021-02-22 NOTE — Patient Instructions (Signed)
Medication Instructions:  NONE *If you need a refill on your cardiac medications before your next appointment, please call your pharmacy*   Lab Work: NONE If you have labs (blood work) drawn today and your tests are completely normal, you will receive your results only by: MyChart Message (if you have MyChart) OR A paper copy in the mail If you have any lab test that is abnormal or we need to change your treatment, we will call you to review the results.   Testing/Procedures: NONE   Follow-Up: At [facility], you and your health needs are our priority.  As part of our continuing mission to provide you with exceptional heart care, we have created designated Provider Care Teams.  These Care Teams include your primary Cardiologist (physician) and Advanced Practice Providers (APPs -  Physician Assistants and Nurse Practitioners) who all work together to provide you with the care you need, when you need it.  Your next appointment:    Call as needed

## 2021-02-22 NOTE — Therapy (Signed)
[facility] [address] [place], [place], [address] Phone: [phone]   Fax:  [phone]  Physical Therapy Treatment  Patient Details  Name: [name] MRN: [id number] Date of Birth: 06-03-1970 Referring Provider (PT): Rosalin Hawking, MD   Encounter Date: 02/22/2021   PT End of Session - 02/22/21 1133     Visit Number 6    Number of Visits 17    Date for PT Re-Evaluation 04/03/21    Authorization Type BC[employer]    Authorization - Visit Number 5    Authorization - Number of Visits 50    PT Start Time 1100    PT Stop Time 1130    PT Time Calculation (min) 30 min    Equipment Utilized During Treatment Gait belt    Activity Tolerance Patient tolerated treatment well    Behavior During Therapy [facility] for tasks assessed/performed             History reviewed. No pertinent past medical history.  Past Surgical History:  Procedure Laterality Date   BUBBLE STUDY  01/28/2021   Procedure: BUBBLE STUDY;  Surgeon: Pixie Casino, MD;  Location: [facility];  Service: Cardiovascular;;   IR CT HEAD LTD  01/23/2021   IR IVC FILTER PLMT / S&I Burke Keels GUID/MOD SED  01/28/2021   IR PERCUTANEOUS ART THROMBECTOMY/INFUSION INTRACRANIAL INC DIAG ANGIO  01/23/2021   RADIOLOGY WITH ANESTHESIA N/A 01/23/2021   Procedure: IR WITH ANESTHESIA;  Surgeon: Radiologist, Medication, MD;  Location: [facility];  Service: Radiology;  Laterality: N/A;   TEE WITHOUT CARDIOVERSION N/A 01/28/2021   Procedure: TRANSESOPHAGEAL ECHOCARDIOGRAM (TEE);  Surgeon: Pixie Casino, MD;  Location: [facility] ENDOSCOPY;  Service: Cardiovascular;  Laterality: N/A;    There were no vitals filed for this visit.   Subjective Assessment - 02/22/21 1102     Subjective Pt did a lot of walking over the weekend. Went to the mall and walked 3 levels including stairs.    Patient is accompained by: Family member    Pertinent History HTN, DM II, HLD    Patient Stated Goals Get  back to work    Currently in Pain? No/denies    Pain Onset 1 to 4 weeks ago                [facility] PT Assessment - 02/22/21 0001       Functional Gait  Assessment   Gait assessed  Yes    Gait Level Surface Walks 20 ft in less than 5.5 sec, no assistive devices, good speed, no evidence for imbalance, normal gait pattern, deviates no more than 6 in outside of the 12 in walkway width.    Change in Gait Speed Able to smoothly change walking speed without loss of balance or gait deviation. Deviate no more than 6 in outside of the 12 in walkway width.    Gait with Horizontal Head Turns Performs head turns smoothly with no change in gait. Deviates no more than 6 in outside 12 in walkway width    Gait with Vertical Head Turns Performs head turns with no change in gait. Deviates no more than 6 in outside 12 in walkway width.    Gait and Pivot Turn Pivot turns safely within 3 sec and stops quickly with no loss of balance.    Step Over Obstacle Is able to step over 2 stacked shoe boxes taped together (9 in total height) without changing gait speed. No evidence of imbalance.  Gait with Narrow Base of Support Is able to ambulate for 10 steps heel to toe with no staggering.    Gait with Eyes Closed Walks 20 ft, no assistive devices, good speed, no evidence of imbalance, normal gait pattern, deviates no more than 6 in outside 12 in walkway width. Ambulates 20 ft in less than 7 sec.    Ambulating Backwards Walks 20 ft, no assistive devices, good speed, no evidence for imbalance, normal gait    Steps Alternating feet, no rail.    Total Score 30                                    PT Education - 02/22/21 1132     Education Details Discussed goals checked and plan for continuing fitness. Pt walks 30 min most days. Encouraged pt to weight train and gave pt online options for fitness program    Person(s) Educated Patient    Methods Explanation    Comprehension Verbalized  understanding              PT Short Term Goals - 02/17/21 1055       PT SHORT TERM GOAL #1   Title Pt will initiate HEP in order to indicate improved functional mobility and balance (Target Date: 03/04/21)    Baseline met    Time 4    Period Weeks    Status Achieved    Target Date 03/04/21      PT SHORT TERM GOAL #2   Title Pt will perform 5TSS in </=11.5 secs without UE support in order to indicate improved functional LE strength.    Baseline 8.18 secs without UE support    Time 4    Period Weeks    Status Achieved      PT SHORT TERM GOAL #3   Title Pt will improve FGA to >/=25/30 in order to indicate dec fall risk.    Time 4    Period Weeks    Status New      PT SHORT TERM GOAL #4   Title Pt will improve gait speed to >/=3.0 ft/sec in order to indicate improved community ambulation.    Baseline 4.3 ft/sec without device    Time 4    Period Weeks    Status Achieved      PT SHORT TERM GOAL #5   Title Pt will demonstrate improved hamstring and hip flexibility and therefore state no more than 4/10 pain with transitional movements.    Time 4    Period Weeks    Status Achieved               PT Long Term Goals - 02/22/21 1112       PT LONG TERM GOAL #1   Title Pt will be IND with final HEP and walking program in order to indicate improved functional mobility and dec fall risk. (Target Date: 04/03/21)    Baseline met    Time 8    Period Weeks    Status Achieved    Target Date 04/03/21      PT LONG TERM GOAL #2   Title Pt will improve FGA to >/=28/30 in order to indicate dec fall risk and safe return to work.    Baseline FGA 30/30    Time 8    Period Weeks    Status Achieved      PT LONG TERM GOAL #  3   Title Pt will ambulate x 1000' over varying outdoor surfaces at independent level in order to indicate safe return to community and leisure activities.    Baseline Met.    Time 8    Period Weeks    Status Achieved      PT LONG TERM GOAL #4   Title Pt  will demonstrate ability to perform transitional movements (floor to seating surface, sit<>stand, supine<>sit) with no more than 2/10 pain in order to indicate improved flexibility.    Baseline performed today without increase in pain    Time 8    Period Weeks    Status Achieved      PT LONG TERM GOAL #5   Title Patient will improve Physical FS Primary Measure (in FOTO) to 77 in order to indicate improved self reported function.    Baseline Foto score 82    Time 8    Period Weeks    Status Achieved                   Plan - 02/22/21 1135     Clinical Impression Statement Pt met all LTGs progressing with functional strength and balance and was ready to d/c today.    Personal Factors and Comorbidities Age;Comorbidity 3+    Comorbidities see above    Examination-Activity Limitations Locomotion Level;Sit;Squat;Stairs;Stand;Transfers    Examination-Participation Restrictions Community Activity;Driving;Occupation;Shop    Stability/Clinical Decision Making Evolving/Moderate complexity    Rehab Potential Good    PT Frequency 2x / week    PT Duration 8 weeks   may be able to D/c before this   PT Treatment/Interventions ADLs/Self Care Home Management;Aquatic Therapy;Gait training;Stair training;Functional mobility training;Therapeutic activities;Therapeutic exercise;Balance training;Neuromuscular re-education;Patient/family education;Passive range of motion;Vestibular    PT Next Visit Plan send d/c to MD.    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 [place], [place], [address] Phone: [phone]   Fax:  [phone]  Name: [name] MRN: [id number] Date of Birth: Jun 07, 1970

## 2021-02-23 LAB — JAK2 (INCLUDING V617F AND EXON 12), MPL,& CALR-NEXT GEN SEQ

## 2021-02-24 ENCOUNTER — Encounter: Payer: Self-pay | Admitting: Neurology

## 2021-02-24 ENCOUNTER — Ambulatory Visit: Payer: Federal, State, Local not specified - PPO | Admitting: Rehabilitation

## 2021-02-24 ENCOUNTER — Other Ambulatory Visit: Payer: Self-pay

## 2021-02-24 ENCOUNTER — Ambulatory Visit: Payer: Federal, State, Local not specified - PPO | Admitting: Neurology

## 2021-02-24 ENCOUNTER — Other Ambulatory Visit: Payer: Self-pay | Admitting: Neurology

## 2021-02-24 VITALS — BP 136/83 | HR 83 | Ht 63.0 in | Wt 180.0 lb

## 2021-02-24 DIAGNOSIS — I6602 Occlusion and stenosis of left middle cerebral artery: Secondary | ICD-10-CM

## 2021-02-24 DIAGNOSIS — I749 Embolism and thrombosis of unspecified artery: Secondary | ICD-10-CM | POA: Diagnosis not present

## 2021-02-24 DIAGNOSIS — I639 Cerebral infarction, unspecified: Secondary | ICD-10-CM | POA: Diagnosis not present

## 2021-02-24 DIAGNOSIS — I82431 Acute embolism and thrombosis of right popliteal vein: Secondary | ICD-10-CM

## 2021-02-24 MED ORDER — ASPIRIN EC 81 MG PO TBEC: 81.0000 mg | DELAYED_RELEASE_TABLET | Freq: Every day | ORAL | 11 refills | Status: DC

## 2021-02-24 NOTE — Patient Instructions (Signed)
I had a long d/w patient about his recent stroke, risk for recurrent stroke/TIAs, personally independently reviewed imaging studies and stroke evaluation results and answered questions.Start aspirin 81 mg daily  for secondary stroke prevention and maintain strict control of hypertension with blood pressure goal below 130/90, diabetes with hemoglobin A1c goal below 6.5% and lipids with LDL cholesterol goal below 70 mg/dL. I also advised the patient to eat a healthy diet with plenty of whole grains, cereals, fruits and vegetables, exercise regularly and maintain ideal body weight .check CT scan of the head without contrast and if the blood is isodense on the CT will switch aspirin to Eliquis for her acute DVT.  Check transcranial Doppler bubble study to gauge size of the PFO more accurately to decide on endovascular PFO closure.  She will likely need repeat Factor VIII levels to be drawn in 3 months as elevated levels may be due to acute phase reaction.  She may return to work part-time initially for a few weeks and then full-time and we will fill out FMLA paperwork upon her request.  Followup in the future with me in 3 to 4 months or call earlier if necessary. Stroke Prevention Some medical conditions and behaviors can lead to a higher chance of having a stroke. You can help prevent a stroke by eating healthy, exercising, not smoking, and managing any medical conditions you have. Stroke is a leading cause of functional impairment. Primary prevention is particularly important because a majority of strokes are first-time events. Stroke changes the lives of not only those who experience a stroke but also their family and other caregivers. How can this condition affect me? A stroke is a medical emergency and should be treated right away. A stroke can lead to brain damage and can sometimes be life-threatening. If a person gets medical treatment right away, there is a better chance of surviving and recovering from a  stroke. What can increase my risk? The following medical conditions may increase your risk of a stroke: Cardiovascular disease. High blood pressure (hypertension). Diabetes. High cholesterol. Sickle cell disease. Blood clotting disorders (hypercoagulable state). Obesity. Sleep disorders (obstructive sleep apnea). Other risk factors include: Being older than age 55. Having a history of blood clots, stroke, or mini-stroke (transient ischemic attack, TIA). Genetic factors, such as race, ethnicity, or a family history of stroke. Smoking cigarettes or using other tobacco products. Taking birth control pills, especially if you also use tobacco. Heavy use of alcohol or drugs, especially cocaine and methamphetamine. Physical inactivity. What actions can I take to prevent this? Manage your health conditions High cholesterol levels. Eating a healthy diet is important for preventing high cholesterol. If cholesterol cannot be managed through diet alone, you may need to take medicines. Take any prescribed medicines to control your cholesterol as told by your health care provider. Hypertension. To reduce your risk of stroke, try to keep your blood pressure below 130/80. Eating a healthy diet and exercising regularly are important for controlling blood pressure. If these steps are not enough to manage your blood pressure, you may need to take medicines. Take any prescribed medicines to control hypertension as told by your health care provider. Ask your health care provider if you should monitor your blood pressure at home. Have your blood pressure checked every year, even if your blood pressure is normal. Blood pressure increases with age and some medical conditions. Diabetes. Eating a healthy diet and exercising regularly are important parts of managing your blood sugar (glucose). If your  blood sugar cannot be managed through diet and exercise, you may need to take medicines. Take any prescribed  medicines to control your diabetes as told by your health care provider. Get evaluated for obstructive sleep apnea. Talk to your health care provider about getting a sleep evaluation if you snore a lot or have excessive sleepiness. Make sure that any other medical conditions you have, such as atrial fibrillation or atherosclerosis, are managed. Nutrition Follow instructions from your health care provider about what to eat or drink to help manage your health condition. These instructions may include: Reducing your daily calorie intake. Limiting how much salt (sodium) you use to 1,500 milligrams (mg) each day. Using only healthy fats for cooking, such as olive oil, canola oil, or sunflower oil. Eating healthy foods. You can do this by: Choosing foods that are high in fiber, such as whole grains, and fresh fruits and vegetables. Eating at least 5 servings of fruits and vegetables a day. Try to fill one-half of your plate with fruits and vegetables at each meal. Choosing lean protein foods, such as lean cuts of meat, poultry without skin, fish, tofu, beans, and nuts. Eating low-fat dairy products. Avoiding foods that are high in sodium. This can help lower blood pressure. Avoiding foods that have saturated fat, trans fat, and cholesterol. This can help prevent high cholesterol. Avoiding processed and prepared foods. Counting your daily carbohydrate intake.  Lifestyle If you drink alcohol: Limit how much you have to: 0-1 drink a day for women who are not pregnant. 0-2 drinks a day for men. Know how much alcohol is in your drink. In the [place] one drink equals one 12 oz bottle of beer ( ), one 5 oz glass of wine ( ), or one 1 oz glass of hard liquor (95mL). Do not use any products that contain nicotine or tobacco. These products include cigarettes, chewing tobacco, and vaping devices, such as e-cigarettes. If you need help quitting, ask your health care provider. Avoid secondhand  smoke. Do not use drugs. Activity  Try to stay at a healthy weight. Get at least 30 minutes of exercise on most days, such as: Fast walking. Biking. Swimming. Medicines Take over-the-counter and prescription medicines only as told by your health care provider. Aspirin or blood thinners (antiplatelets or anticoagulants) may be recommended to reduce your risk of forming blood clots that can lead to stroke. Avoid taking birth control pills. Talk to your health care provider about the risks of taking birth control pills if: You are over 86 years old. You smoke. You get very bad headaches. You have had a blood clot. Where to find more information American Stroke Association: www.strokeassociation.org Get help right away if: You or a loved one has any symptoms of a stroke. "BE FAST" is an easy way to remember the main warning signs of a stroke: B - Balance. Signs are dizziness, sudden trouble walking, or loss of balance. E - Eyes. Signs are trouble seeing or a sudden change in vision. F - Face. Signs are sudden weakness or numbness of the face, or the face or eyelid drooping on one side. A - Arms. Signs are weakness or numbness in an arm. This happens suddenly and usually on one side of the body. S - Speech. Signs are sudden trouble speaking, slurred speech, or trouble understanding what people say. T - Time. Time to call emergency services. Write down what time symptoms started. You or a loved one has other signs of a stroke, such as: A  sudden, severe headache with no known cause. Nausea or vomiting. Seizure. These symptoms may represent a serious problem that is an emergency. Do not wait to see if the symptoms will go away. Get medical help right away. Call your local emergency services (911 in the [place] Do not drive yourself to the hospital. Summary You can help to prevent a stroke by eating healthy, exercising, not smoking, limiting alcohol intake, and managing any medical conditions  you may have. Do not use any products that contain nicotine or tobacco. These include cigarettes, chewing tobacco, and vaping devices, such as e-cigarettes. If you need help quitting, ask your health care provider. Remember "BE FAST" for warning signs of a stroke. Get help right away if you or a loved one has any of these signs. This information is not intended to replace advice given to you by your health care provider. Make sure you discuss any questions you have with your health care provider. Document Revised: 08/19/2019 Document Reviewed: 08/19/2019 Elsevier Patient Education  2022 [employer]

## 2021-02-24 NOTE — Progress Notes (Signed)
[facility] [place]. [place] [address] [phone]       OFFICE CONSULT NOTE  [name] Date of Birth:  11/10/1970 Medical Record Number:  [id number]   Referring MD: Rosalin Hawking  Reason for Referral: Stroke  HPI: [name] is a 51 year old Caucasian lady seen today for initial office consultation visit.  History is obtained from the patient and her husband as well as review of electronic medical records and opossum reviewed available pertinent imaging films in PACS.  She is a 51 year old Caucasian lady with past medical history of uncontrolled diabetes, hypertension and hyperlipidemia who presented on 01/23/2021 with sudden onset of aphasia, left gaze preference and right hemiplegia with NIH stroke scale of 18.  She was given IV TNK and CT angiogram showed left M1 thrombus and she was taken for emergent thrombectomy and underwent successful TICI 3 revascularization of inferior division of the left MCA.  She was admitted to the ICU with follow-up CT scan showed hemorrhagic transformation of the left insula and frontal region.  2D echo showed normal ejection fraction.  TEE showed a small PFO but no other cardiac source of embolism.  LDL cholesterol was 73 mg percent.  Hemoglobin A1c was elevated at 11.1.  Lower extremity venous Doppler showed acute DVT involving popliteal, soleal and gastrocnemius veins.  Patient could not be anticoagulated due to intracerebral hemorrhage and underwent an IVC filter placement.  Initial hypercoagulable panel labs were negative but subsequently she saw hematologist Dr. Irene Limbo who ordered factor VIII levels which was found to be elevated at 211 percentage units but it is unclear if this is acute phase reactant.  There is a family history of DVTs in her maternal aunt as well as her grandfather died of stroke in his 27s.  Patient had no history of recent prolonged travel via airline or road or any prior history of DVTs or pulmonary  embolism.  She was not started on any antiplatelet or anticoagulants due to hemorrhagic transformation of her stroke.  Patient actually has done quite well and her aphasia has resolved.  She is finished outpatient speech therapy.  Her physical strength has also improved back to baseline.  Her stamina has not she gets tired easily.  She is currently not working and wants paperwork to be done for [facility] but she feels she is ready to return to work part-time for a few weeks.  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  She has no prior history of strokes, TIAs, migraines seizures or any other neurological problems.  She was seen by Dr. Burt Knack earlier this week who has deferred decision about endovascular PFO closure pending final decision whether she needs long-term anticoagulation or not.  ROS:   14 system review of systems is positive for aphasia, speech difficulties, weakness, DVT, bruising all other systems negative  PMH:  Past Medical History:  Diagnosis Date   Hyperlipidemia    Hypertension    Stroke [facility])     Social History:  Social History   Socioeconomic History   Marital status: Married    Spouse name: Not on file   Number of children: Not on file   Years of education: Not on file   Highest education level: Not on file  Occupational History   Not on file  Tobacco Use   Smoking status: Never   Smokeless tobacco: Never  Vaping Use   Vaping Use: Not on file  Substance and Sexual Activity  Alcohol use: Not on file   Drug use: Not on file   Sexual activity: Not on file  Other Topics Concern   Not on file  Social History Narrative   Not on file   Social Determinants of Health   Financial Resource Strain: Not on file  Food Insecurity: Not on file  Transportation Needs: Not on file  Physical Activity: Not on file  Stress: Not on file  Social Connections: Not on file  Intimate Partner Violence: Not on file    Medications:   Current Outpatient Medications on  File Prior to Visit  Medication Sig Dispense Refill   atorvastatin (LIPITOR) 40 MG tablet Take 1 tablet (40 mg total) by mouth daily. 30 tablet 0   gabapentin (NEURONTIN) 100 MG capsule Take 100 mg by mouth every evening.     lisinopril-hydrochlorothiazide (ZESTORETIC) 10-12.5 MG tablet Take 1 tablet by mouth every evening.     metFORMIN (GLUCOPHAGE-XR) 500 MG 24 hr tablet Take 500 mg by mouth daily with supper.     triamcinolone cream (KENALOG) 0.1 % Apply 1 application topically 2 (two) times daily as needed.     No current facility-administered medications on file prior to visit.    Allergies:  No Known Allergies  Physical Exam General: well developed, well nourished middle-aged Caucasian lady, seated, in no evident distress Head: head normocephalic and atraumatic.   Neck: supple with no carotid or supraclavicular bruits Cardiovascular: regular rate and rhythm, no murmurs Musculoskeletal: no deformity Skin:  no rash/petichiae Vascular:  Normal pulses all extremities  Neurologic Exam Mental Status: Awake and fully alert. Oriented to place and time. Recent and remote memory intact. Attention span, concentration and fund of knowledge appropriate. Mood and affect appropriate.  Cranial Nerves: Fundoscopic exam reveals sharp disc margins. Pupils equal, briskly reactive to light. Extraocular movements full without nystagmus. Visual fields full to confrontation. Hearing intact. Facial sensation intact.  Mild right lower facial asymmetry when she smiles., tongue, palate moves normally and symmetrically.  Motor: Normal bulk and tone. Normal strength in all tested extremity muscles. Sensory.: intact to touch , pinprick , position and vibratory sensation.  Coordination: Rapid alternating movements normal in all extremities. Finger-to-nose and heel-to-shin performed accurately bilaterally. Gait and Station: Arises from chair without difficulty. Stance is normal. Gait demonstrates normal stride  length and balance . Able to heel, toe and tandem walk without difficulty.  Reflexes: 1+ and symmetric. Toes downgoing.   NIHSS  1 Modified Rankin  2   ASSESSMENT: 51 year old Caucasian lady with embolic left MCA infarct and December 2022 due to left inferior division MCA occlusion treated with IV thrombolysis with TNKase followed by successful mechanical thrombectomy but complicated by hemorrhagic transformation.  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  Subsequent hypercoagulable work-up has shown mildly elevated factor VIII levels which are of unclear significance.  Family history of stroke in father in the 63s and are not having DVT makes hereditary thrombophilia a  possibility     PLAN: I had a long d/w patient about his recent stroke, risk for recurrent stroke/TIAs, personally independently reviewed imaging studies and stroke evaluation results and answered questions.Start aspirin 81 mg daily  for secondary stroke prevention and maintain strict control of hypertension with blood pressure goal below 130/90, diabetes with hemoglobin A1c goal below 6.5% and lipids with LDL cholesterol goal below 70 mg/dL. I also advised the patient to  eat a healthy diet with plenty of whole grains, cereals, fruits and vegetables, exercise regularly and maintain ideal body weight .check CT scan of the head without contrast and if the blood is isodense on the CT will switch aspirin to Eliquis for her acute DVT.  Check transcranial Doppler bubble study to gauge size of the PFO more accurately to decide on endovascular PFO closure.  She will likely need repeat Factor VIII levels to be drawn in 3 months as elevated levels may be due to acute phase reaction.  She may return to work part-time initially for a few weeks and then full-time and we will fill out FMLA paperwork upon her request.  Followup in the future with me  in 3 to 4 months or call earlier if necessary.  Greater than 50% time during this 60-minute consultation visit was spent on counseling and coordination of care about her embolic stroke, paradoxical embolism, DVT and discussion about hypercoagulability and risk benefit of anticoagulation and endovascular PFO closure and answering questions. Antony Contras, MD Note: This document was prepared with digital dictation and possible smart phrase technology. Any transcriptional errors that result from this process are unintentional.

## 2021-02-25 ENCOUNTER — Telehealth: Payer: Self-pay | Admitting: Neurology

## 2021-02-25 NOTE — Telephone Encounter (Signed)
For the Carotid no auth req- sent a message  to Lupita Leash she will reach out to the patient to schedule.   For the CT head no auth req order sent to GI, they will reach out to the patient to schedule.

## 2021-03-01 ENCOUNTER — Ambulatory Visit: Payer: Federal, State, Local not specified - PPO | Admitting: Physical Therapy

## 2021-03-02 ENCOUNTER — Inpatient Hospital Stay (HOSPITAL_BASED_OUTPATIENT_CLINIC_OR_DEPARTMENT_OTHER): Payer: Federal, State, Local not specified - PPO | Admitting: Hematology

## 2021-03-02 ENCOUNTER — Encounter: Payer: Self-pay | Admitting: Hematology

## 2021-03-02 DIAGNOSIS — I082 Rheumatic disorders of both aortic and tricuspid valves: Secondary | ICD-10-CM | POA: Diagnosis not present

## 2021-03-02 DIAGNOSIS — Z79899 Other long term (current) drug therapy: Secondary | ICD-10-CM | POA: Diagnosis not present

## 2021-03-02 DIAGNOSIS — R609 Edema, unspecified: Secondary | ICD-10-CM | POA: Diagnosis not present

## 2021-03-02 DIAGNOSIS — Z86718 Personal history of other venous thrombosis and embolism: Secondary | ICD-10-CM | POA: Diagnosis not present

## 2021-03-02 DIAGNOSIS — I10 Essential (primary) hypertension: Secondary | ICD-10-CM | POA: Diagnosis not present

## 2021-03-02 DIAGNOSIS — D6859 Other primary thrombophilia: Secondary | ICD-10-CM

## 2021-03-02 DIAGNOSIS — E785 Hyperlipidemia, unspecified: Secondary | ICD-10-CM | POA: Diagnosis not present

## 2021-03-02 DIAGNOSIS — I824Z3 Acute embolism and thrombosis of unspecified deep veins of distal lower extremity, bilateral: Secondary | ICD-10-CM | POA: Diagnosis not present

## 2021-03-02 DIAGNOSIS — Z8673 Personal history of transient ischemic attack (TIA), and cerebral infarction without residual deficits: Secondary | ICD-10-CM | POA: Diagnosis not present

## 2021-03-02 DIAGNOSIS — E1136 Type 2 diabetes mellitus with diabetic cataract: Secondary | ICD-10-CM | POA: Diagnosis not present

## 2021-03-02 DIAGNOSIS — I629 Nontraumatic intracranial hemorrhage, unspecified: Secondary | ICD-10-CM | POA: Diagnosis not present

## 2021-03-02 DIAGNOSIS — R233 Spontaneous ecchymoses: Secondary | ICD-10-CM | POA: Diagnosis not present

## 2021-03-02 DIAGNOSIS — Z8679 Personal history of other diseases of the circulatory system: Secondary | ICD-10-CM | POA: Diagnosis not present

## 2021-03-02 DIAGNOSIS — I82431 Acute embolism and thrombosis of right popliteal vein: Secondary | ICD-10-CM | POA: Diagnosis not present

## 2021-03-02 DIAGNOSIS — Z8774 Personal history of (corrected) congenital malformations of heart and circulatory system: Secondary | ICD-10-CM | POA: Diagnosis not present

## 2021-03-03 ENCOUNTER — Ambulatory Visit: Payer: Federal, State, Local not specified - PPO | Admitting: Rehabilitation

## 2021-03-03 ENCOUNTER — Telehealth: Payer: Self-pay | Admitting: Hematology

## 2021-03-03 NOTE — Telephone Encounter (Signed)
Scheduled follow-up appointment per 1/31 los. Patient is aware. °

## 2021-03-04 ENCOUNTER — Other Ambulatory Visit: Payer: Self-pay

## 2021-03-04 ENCOUNTER — Ambulatory Visit (HOSPITAL_COMMUNITY)
Admission: RE | Admit: 2021-03-04 | Discharge: 2021-03-04 | Disposition: A | Discharge status: Home / Self Care | Payer: Federal, State, Local not specified - PPO | Source: Ambulatory Visit | Attending: Neurology | Admitting: Neurology

## 2021-03-04 DIAGNOSIS — I639 Cerebral infarction, unspecified: Secondary | ICD-10-CM | POA: Diagnosis not present

## 2021-03-04 NOTE — Progress Notes (Signed)
I have personally communicated the results with the patient at the time of the study.  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  Since she has an indication for anticoagulation for now we can wait on making any decision about PFO closure as long as she is on anticoagulation.

## 2021-03-04 NOTE — Progress Notes (Signed)
VASCULAR LAB    TCD with bubbles has been performed.  See CV proc for preliminary results.   Delsie Amador, RVT 03/04/2021, 2:32 PM

## 2021-03-08 ENCOUNTER — Ambulatory Visit: Payer: Federal, State, Local not specified - PPO | Admitting: Physical Therapy

## 2021-03-08 NOTE — Progress Notes (Signed)
Marland Kitchen   HEMATOLOGY/ONCOLOGY PHONE VISIT NOTE  Date of Service: .03/02/2021  Patient Care Team: Shirline Frees, MD as PCP - General (Family Medicine)  CHIEF COMPLAINTS/PURPOSE OF CONSULTATION:  Presence of IVC filter B/l DVT ?Paradoxical Embolism causing CVA  HISTORY OF PRESENTING ILLNESS:   [name] is a wonderful 51 y.o. female who has been referred to [place] by Dr Rosalin Hawking and Dr .Kenton Kingfisher, Gwyndolyn Saxon, MD for evaluation and management of unprovoked bilateral deep venous thrombosis.  Patient is here with her husband and most of the information was obtained from them and from information in her medical chart. Patient has a history of hypertension, diabetes, dyslipidemia and presented to the emergency room on 01/23/2021 with acute onset of aphasia and right-sided weakness. 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  Received TNKase and underwent successful thrombectomy. Work-up of her CVA included a TEE which showed a small PFO with bilateral shunting. Patient also had bilateral lower extremity venous duplex ultrasounds on 01/24/2021 which showed acute DVT of the right popliteal vein, right soleal veins and right gastrocnemius vein.  Acute DVT involving the left gastrocnemius vein.  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  Patient had an IVC filter placed due to inability to anticoagulate for her DVT.  Patient was discharged without any antiplatelet therapy or anticoagulation.  Her neurological symptoms have resolved to a significant extent.  No current headaches.  Her embolic CVA was thought to be possibly paradoxical embolus through the PFO from her DVT. She notes no significant lower extremity pain or leg swelling.  Prior to her CVA she notes no long distance travel.  No significant immobility. No previous history of VTE. No family history of VTE. Denies any hormonal exposure or hormone  replacement therapy. No recent COVID-19 infection or vaccination. No other overt provoking factors for her DVT noted.  Patient has follow-up with cardiology to determine the need for PFO closure. She has follow-up with Dr. Antony Contras for continued evaluation and management of her CVA with hemorrhagic transformation.  INTERVAL HISTORY  I connected with Lamarr Lulas on .03/02/2021 at  9:20 AM EST by telephone visit and verified that I am speaking with the correct person using two identifiers.   I discussed the limitations, risks, security and privacy concerns of performing an evaluation and management service by telemedicine and the availability of in-person appointments. I also discussed with the patient that there may be a patient responsible charge related to this service. The patient expressed understanding and agreed to proceed.   Other persons participating in the visit and their role in the encounter: none   Patients location: Home Providers location: [place]  Chief Complaint: Review of hypercoagulability labs.  Patient notes no new symptoms since her last clinic visit.  Most of her focal neurological deficits have resolved.  She has been seen by neurology and has been started on aspirin.  She has been scheduled for repeat CT of the head and transcranial Doppler ultrasound prior to consideration of starting Eliquis for anticoagulation. Her hypercoagulability work-up was unrevealing other than an elevated factor VIII level which we discussed could be reactive in the context of using TNK and with her stroke.  It would be appropriate to repeat these levels in about 3 months.  Irrespective of the results we discussed that since her lower extremity DVTs are considered unprovoked there would be a  role for chronic anticoagulation.    MEDICAL HISTORY:  Hypertension Uncontrolled diabetes Dyslipidemia Stroke [left MCA scattered infarcts status post TNK] with  hemorrhagic transformation of ischemic stroke PFO DVT bilateral lower extremities status post IVC filter placement History of migraine headaches  SURGICAL HISTORY: Past Surgical History:  Procedure Laterality Date   BUBBLE STUDY  01/28/2021   Procedure: BUBBLE STUDY;  Surgeon: Pixie Casino, MD;  Location: [place];  Service: Cardiovascular;;   IR CT HEAD LTD  01/23/2021   IR IVC FILTER PLMT / S&I Burke Keels GUID/MOD SED  01/28/2021   IR PERCUTANEOUS ART THROMBECTOMY/INFUSION INTRACRANIAL INC DIAG ANGIO  01/23/2021   RADIOLOGY WITH ANESTHESIA N/A 01/23/2021   Procedure: IR WITH ANESTHESIA;  Surgeon: Radiologist, Medication, MD;  Location: [place];  Service: Radiology;  Laterality: N/A;   TEE WITHOUT CARDIOVERSION N/A 01/28/2021   Procedure: TRANSESOPHAGEAL ECHOCARDIOGRAM (TEE);  Surgeon: Pixie Casino, MD;  Location: [facility];  Service: Cardiovascular;  Laterality: N/A;    SOCIAL HISTORY: Social History   Socioeconomic History   Marital status: Married    Spouse name: Not on file   Number of children: Not on file   Years of education: Not on file   Highest education level: Not on file  Occupational History   Not on file  Tobacco Use   Smoking status: Never   Smokeless tobacco: Never  Vaping Use   Vaping Use: Not on file  Substance and Sexual Activity   Alcohol use: Not on file   Drug use: Not on file   Sexual activity: Not on file  Other Topics Concern   Not on file  Social History Narrative   Not on file   Social Determinants of Health   Financial Resource Strain: Not on file  Food Insecurity: Not on file  Transportation Needs: Not on file  Physical Activity: Not on file  Stress: Not on file  Social Connections: Not on file  Intimate Partner Violence: Not on file    FAMILY HISTORY: Family History  Problem Relation Age of Onset   Heart attack Mother    Atrial fibrillation Mother    Heart disease Father    Stroke Maternal Grandfather     ALLERGIES:   has No Known Allergies.  MEDICATIONS:  Current Outpatient Medications  Medication Sig Dispense Refill   aspirin EC 81 MG tablet Take 1 tablet (81 mg total) by mouth daily. Swallow whole. 30 tablet 11   atorvastatin (LIPITOR) 40 MG tablet Take 1 tablet (40 mg total) by mouth daily. 30 tablet 0   gabapentin (NEURONTIN) 100 MG capsule Take 100 mg by mouth every evening.     lisinopril-hydrochlorothiazide (ZESTORETIC) 10-12.5 MG tablet Take 1 tablet by mouth every evening.     metFORMIN (GLUCOPHAGE-XR) 500 MG 24 hr tablet Take 500 mg by mouth daily with supper.     triamcinolone cream (KENALOG) 0.1 % Apply 1 application topically 2 (two) times daily as needed.     No current facility-administered medications for this visit.    REVIEW OF SYSTEMS:   No acute new focal neurological deficits.  No chest pain or shortness of breath.    PHYSICAL EXAMINATION: Telemedicine visit  LABORATORY DATA:  I have reviewed the data as listed  . CBC Latest Ref Rng & Units 02/16/2021 01/29/2021 01/28/2021  WBC 4.0 - 10.5 K/uL 9.9 12.5(H) 10.5  Hemoglobin 12.0 - 15.0 g/dL 12.4 13.0 12.5  Hematocrit 36.0 - 46.0 % 36.9 36.8 37.0  Platelets 150 - 400  K/uL 349 184 179    . CMP Latest Ref Rng & Units 01/29/2021 01/28/2021 01/27/2021  Glucose 70 - 99 mg/dL 107(H) 118(H) 153(H)  BUN 6 - 20 mg/dL 13 12 10   Creatinine 0.44 - 1.00 mg/dL 0.66 0.59 0.57  Sodium 135 - 145 mmol/L 134(L) 133(L) 136  Potassium 3.5 - 5.1 mmol/L 3.6 4.5 3.3(L)  Chloride 98 - 111 mmol/L 99 101 101  CO2 22 - 32 mmol/L 23 21(L) 23  Calcium 8.9 - 10.3 mg/dL 9.5 9.3 9.3  Total Protein 6.5 - 8.1 g/dL - - -  Total Bilirubin 0.3 - 1.2 mg/dL - - -  Alkaline Phos 38 - 126 U/L - - -  AST 15 - 41 U/L - - -  ALT 0 - 44 U/L - - -   Phosphatidylserine antibodies Order: [id number] Collected 01/28/2021 03:12    0 Result Notes     Component Ref Range & Units 4 wk ago  Phosphatydalserine, IgG 0 - 30 Units 9   Comment: (NOTE)  Performed At:  [facility]  [place], [place] [id number]  Rush Farmer MD PF:[id number]   Phosphatydalserine, IgM 0 - 30 Units <10   Phosphatydalserine, IgA 0 - 19 APS Units 1      View Full Report       Result Care Coordination   Patient Communication   Add Comments   Seen Back to Top       Cardiolipin antibodies, IgG, IgM, IgA Order: [id number] Collected 01/24/2021 11:15    0 Result Notes     Component Ref Range & Units 1 mo ago  Anticardiolipin IgG 0 - 14 GPL U/mL <9   Comment: (NOTE)                           Negative:              <15                           Indeterminate:     15 - 20                           Low-Med Positive: >20 - 80                           High Positive:         >80   Anticardiolipin IgM 0 - 12 MPL U/mL <9   Comment: (NOTE)                           Negative:              <13                           Indeterminate:     13 - 20                           Low-Med Positive: >20 - 80                           High Positive:         >80   Anticardiolipin IgA 0 - 11 APL  U/mL <9   Comment: (NOTE)                           Negative:              <12                           Indeterminate:     12 - 20                           Low-Med Positive: >20 - 80                           High Positive:         >80  Performed At: [facility]  [place], [place] [id number]  Rush Farmer MD TZ:[id number]      View Full Report       Result Care Coordination   Patient Communication   Add Comments   Seen Back to Top       Prothrombin gene mutation Order: [id number] Collected 01/24/2021 11:15    0 Result Notes    Component 1 mo ago  Recommendations-PTGENE: Comment   Comment: (NOTE)  Result: c.*97G>A - Not Detected  This result is not associated with an increased risk for venous  thromboembolism. See Additional Clinical Information and  Comments.  Additional Clinical Information:  Venous thromboembolism is  a multifactorial disease influenced by  genetic, environmental, and circumstantial risk factors. The c.*97G>A  variant in the F2 gene is a genetic risk factor for venous  thromboembolism. Heterozygous carriers have a 2- to 4-fold increased  risk for venous thromboembolism. Homozygotes for the c.*97G>A variant  are rare. The annual risk of VTE in homozygotes has been reported to  be 1.1%/year. Individuals who carry both a c.*97G>A variant in the  F2 gene and a c.1601G>A (p. Arg534Gln) variant in the F5 gene  (commonly referred to as Factor V Leiden) have an approximately 20-  fold increased risk for venous thromboembolism. Risks are likely to  be even higher in more complex genotype combinations involving the  F2 c.*97G>A variant and Factor V Leiden (PMID: 65993570). Additional  risk factors include but are not limited to: deficiency of protein C,  protein S, or antithrombin III, age, female sex, personal or family  history of deep vein thromboembolism, smoking, surgery, prolonged  immobilization, malignant neoplasm, tamoxifen treatment, raloxifene  treatment, oral contraceptive use, hormone replacement therapy, and  pregnancy. Management of thrombotic risk and thrombotic events should  follow established guidelines and fit the clinical circumstance. This  result cannot predict the occurrence or recurrence of a thrombotic  event.  Comments:  Genetic counseling is recommended to discuss the potential clinical  implications of positive results, as well as recommendations for  testing family members.  Genetic Coordinators are available for health care providers to  discuss  results at [phone]).  Test Details:  Variant analyzed: c.*97G>A, previously referred to as G20210A  Methods/Limitations:  DNA analysis of the F2 gene (NM_000506.5) was performed by PCR  amplification followed by restriction enzyme analysis. The diagnostic  sensitivity is >99%. Results must be combined with  clinical  information for the most accurate interpretation. Molecular-based  testing is highly accurate, but as in any laboratory test, diagnostic  errors may occur. False positive or false negative  results may occur  for reasons that include genetic variants, blood transfusions, bone  marrow transplantation, somatic or tissue-specific mosaicism,  mislabeled samples, or erroneous representation of family  relationships.  This test was developed and its performance characteristics determined  by [facility]. It has not been cleared or approved by the Food and Drug  Administration.  References:  [facility], [facility] [place] [facility]; [facility] Professional  Practice and Guidelines Committee. Addendum: [place] consensus statement on factor V Leiden mutation  testing. Genet Med. 2021 Mar 5. doi: 10.1038/s41436-021-01108-x.  PMID: 02542706.  Kristopher Oppenheim. Prothrombin Thrombophilia. 2006 Jul 25  [Updated 2021 Feb 4]. In: Tarri Glenn, Ardinger HH, Pagon RA, et al.,  editors. GeneReviews(R) [Internet]. [address] ([place]): [place]  [place], ; 1993-2021. Available from:  https://www.cook-brown.com/  Terrilee Files, Carla Drape, Marin Shutter CS;  [facility] Laboratory Quality Assurance Committee. Venous thromboembolism  laboratory testing (factor V Leiden and factor II c.*97G>A),  2018 update: a technical standard of the [place] ([facility]). Genet Med. 2018 Dec;20(12):1489-1498.  doi: 23.7628/B15176-160-7371-G. Epub 2018 Oct 5. PMID: 62694854.  Ruben Reason, PhD, [facility]  Jane Canary, PhD  Earlean Polka, PhD, [facility]  Fannie Knee, PhD, [facility]  Threasa Alpha, PhD, [facility]  W Gailen Shelter, PhD, [facility]  Lubertha South, PhD, [facility]  Alfredo Bach, PhD, [facility]  Performed At: [facility]  [address] [place], [place] [id number]  Katina Degree MDPhD FG:[id number]      View Full  Report       Result Care Coordination   Patient Communication   Add Comments   Seen Back to Top       Factor 5 leiden Order: [id number] Collected 01/24/2021 11:15    0 Result Notes    Component 1 mo ago  Recommendations-F5LEID: Comment   Comment: (NOTE)  Result: c.1601G>A (p.Arg534Gln) - Not Detected  This result is not associated with an increased risk for venous  thromboembolism. See Additional Clinical Information and  Comments.  Additional Clinical Information:  Venous thromboembolism is a multifactorial disease influenced by  genetic, environmental, and circumstantial risk factors. The  c.1601G>A (p. Arg534Gln) variant in the F5 gene, commonly referred to  as Factor V Leiden, is a genetic risk factor for venous  thromboembolism. Heterozygous carriers of this variant have a 6- to 8-  fold increased risk for venous thromboembolism. Individuals  homozygous for this variant (ie, with a copy of the variant on each  chromosome) have an approximately 80-fold increased risk for venous  thromboembolism. Individuals who carry both a c.*97G>A variant in the  F2 gene and Factor V Leiden have an approximately 20-fold increased  risk for venous thromboembolism. Risks are likely to be even higher  in more complex genotype combinations involving the F2 c.*97G>A  variant and Factor V Leiden (PMID: 75102585). Additional risk factors  include but are not limited to: deficiency of protein C, protein S,  or antithrombin III, age, female sex, personal or family history of  deep vein thromboembolism, smoking, surgery, prolonged  immobilization, malignant neoplasm, tamoxifen treatment, raloxifene  treatment, oral contraceptive use, hormone replacement therapy, and  pregnancy. Management of thrombotic risk and thrombotic events should  follow established guidelines and fit the clinical circumstance. This  result cannot predict the occurrence or recurrence of a thrombotic  event.  Comment:   Genetic counseling is recommended to discuss the potential clinical  implications of positive results, as well as recommendations for  testing family members.  Genetic Coordinators are available for health care providers to  discuss results at [phone]).  Test Details:  Variant Analyzed: c.1601G>A (p. Arg534Gln), referred to as Factor V  Leiden  Methods/Limitations:  DNA analysis of the F5 gene (NM_000130.5) was performed by PCR  amplification followed by restriction enzyme analysis. The  diagnostic sensitivity is >99%. Results must be combined with  clinical information for the most accurate interpretation. Molecular-  based testing is highly accurate, but as in any laboratory test,  diagnostic errors may occur. False positive or false negative results  may occur for reasons that include genetic variants, blood  transfusions, bone marrow transplantation, somatic or tissue-specific  mosaicism, mislabeled samples, or erroneous representation of family  relationships.  This test was developed and its performance characteristics  determined by [facility]. It has not been cleared or approved by the  Food and Drug Administration.  References:  [facility], [facility] [place] [facility]; [facility]  Professional Practice and Guidelines Committee. Addendum: [place] consensus statement on factor V Leiden  mutation testing. Genet Med. 2021 Mar 5. doi: 44.0347/Q25956-387-  01108-x. PMID: 56433295.  Kristopher Oppenheim. Factor V Leiden Thrombophilia. 1999 May 14  (Updated 2018 Jan 4). In: Tarri Glenn, Ardinger HH, Pagon RA, et al.,  editors. GeneReviews(R) (Internet). [address] ([place]): [place] of  [place], [place]; 1993-2021. Available from:  MortgageHole.tn  Terrilee Files, Carla Drape, Marin Shutter CS;  [facility] Laboratory Quality Assurance Committee. Venous thromboembolism  laboratory testing (factor V Leiden  and factor II c.*97G>A), 2018  update: a technical standard of the [place] ([facility]). Genet Med. 2018 Dec;20(12):1489-1498.  doi: 18.8416/S06301-601-0932-T. Epub 2018 Oct 5. PMID: 55732202.  Ruben Reason, PhD, [facility]  Jane Canary, PhD  Earlean Polka, PhD, [facility]  Fannie Knee, PhD, [facility]  Threasa Alpha, PhD, [facility]  W Gailen Shelter, PhD, [facility]  Lubertha South, PhD, [facility]  Alfredo Bach, PhD, [facility]  Performed At: [facility]  [address] [place], [place] [id number]  Katina Degree MDPhD SE:[id number]      View Full Report       Result Care Coordination   Patient Communication   Add Comments   Seen Back to Top       Beta-2-glycoprotein i abs, IgG/M/A Order: [id number] Collected 01/24/2021 11:15    0 Result Notes     Component Ref Range & Units 1 mo ago  Beta-2 Glyco I IgG 0 - 20 GPI IgG units <9   Comment: (NOTE)  The reference interval reflects a 3SD or 99th percentile interval,  which is thought to represent a potentially clinically significant  result in accordance with the International Consensus Statement on  the classification criteria for definitive antiphospholipid  syndrome (APS). J Thromb Haem 2006;4:295-306.   Beta-2-Glycoprotein I IgM 0 - 32 GPI IgM units <9   Comment: (NOTE)  The reference interval reflects a 3SD or 99th percentile interval,  which is thought to represent a potentially clinically significant  result in accordance with the International Consensus Statement on  the classification criteria for definitive antiphospholipid  syndrome (APS). J Thromb Haem 2006;4:295-306.  Performed At: [facility]  [place], [place] [id number]  Rush Farmer MD JK:[id number]   Beta-2-Glycoprotein I IgA 0 - 25 GPI IgA units <9   Comment: (NOTE)  The reference interval reflects a 3SD or 99th percentile interval,  which is thought to represent a potentially clinically significant  result in  accordance with the International Consensus Statement on  the classification criteria for definitive antiphospholipid  syndrome (APS). J Thromb Haem 2006;4:295-306.      View Full Report       Result Care Coordination   Patient Communication   Add Comments   Seen Back to Top       Lupus anticoagulant panel Order: [id number] Collected 01/24/2021 11:15    Status: Edited Result - FINAL    0 Result Notes     Component Ref Range & Units 1 mo ago  PTT Lupus Anticoagulant 0.0 - 51.9 sec 29.4   DRVVT 0.0 - 47.0 sec 33.4   Lupus Anticoag Interp  Comment: VC   Comment: (NOTE)  No lupus anticoagulant was detected.  Performed At: [facility]  [place], [place] [id number]  Rush Farmer MD WN:[id number]      View Full Report     VC=Value has a corrected status       Result Care Coordination   Patient Communication   Add Comments   Seen Back to Top       Protein S, total Order: [id number] Collected 01/24/2021 11:15    0 Result Notes     Component Ref Range & Units 1 mo ago  Protein S Ag, Total 60 - 150 % 90   Comment: (NOTE)  This test was developed and its performance characteristics  determined by [facility]. It has not been cleared or approved  by the Food and Drug Administration.  Performed At: [facility]  [place], [place] [id number]  Rush Farmer MD IR:[id number]      View Full Report       Result Care Coordination   Patient Communication   Add Comments   Seen Back to Top       Protein S activity Order: [id number] Collected 01/24/2021 11:15    0 Result Notes     Component Ref Range & Units 1 mo ago  Protein S Activity 63 - 140 % 72   Comment: (NOTE)  Protein S activity may be falsely increased (masking an abnormal, low  result) in patients receiving direct Xa inhibitor (e.g.,  rivaroxaban, apixaban, edoxaban) or a direct thrombin inhibitor  (e.g., dabigatran) anticoagulant treatment due to  assay interference  by these drugs.  Performed At: [facility]  [place], [place] [id number]  Rush Farmer MD UR:[id number]      View Full Report       Result Care Coordination   Patient Communication   Add Comments   Seen Back to Top        Contains abnormal data Protein C, total Order: [id number] Collected 01/24/2021 11:15    0 Result Notes     Component Ref Range & Units 1 mo ago  Protein C, Total 60 - 150 % 151 High    Comment: (NOTE)  Performed At: [facility]  [place], [place] [id number]  Rush Farmer MD [id number]      View Full Report       Result Care Coordination   Patient Communication   Add Comments   Seen Back to Top       Protein C activity Order: [id number] Collected 01/24/2021 11:15    0 Result Notes     Component Ref Range & Units 1 mo  ago  Protein C Activity 73 - 180 % 148   Comment: (NOTE)  Performed At: [facility]  [place], [place] [id number]  Rush Farmer MD AG:[id number]      View Full Report       Result Care Coordination   Patient Communication   Add Comments   Seen Back to Top       Antithrombin III Order: [id number] Collected 01/24/2021 11:15    0 Result Notes     Component Ref Range & Units 1 mo ago  AntiThromb III Func 75 - 120 % 104   Comment: Performed at [facility], [place] [address], [place], [place] [address]      RADIOGRAPHIC STUDIES: I have personally reviewed the radiological images as listed and agreed with the findings in the report. VAS [place] TRANSCRANIAL DOPPLER W BUBBLES  Result Date: 03/05/2021  Transcranial Doppler with Bubble Patient Name:  MONYA KOZAKIEWICZ  Date of Exam:   03/04/2021 Medical Rec #: [id number]        Accession #:    [id number] Date of Birth: [birth year]       Patient Gender: F Patient Age:   73 years Exam Location:  [facility] Procedure:      VAS [place] TRANSCRANIAL DOPPLER W BUBBLES Referring  Phys: PRAMOD SETHI --------------------------------------------------------------------------------  Indications: Stroke. History: DVT, IVC filter, PFO by TEE, Diabetes, HTN, HLD. Comparison Study: No prior study Performing Technologist: Sharion Dove RVS  Examination Guidelines: A complete evaluation includes B-mode imaging, spectral Doppler, color Doppler, and power Doppler as needed of all accessible portions of each vessel. Bilateral testing is considered an integral part of a complete examination. Limited examinations for reoccurring indications may be performed as noted.  Summary:  A vascular evaluation was performed. The right middle cerebral artery was studied. An IV was inserted into the patient's right forearm. Verbal informed consent was obtained.  Between 30 and 50 high intensity transient signals (HITS) observed at rest and with valsalva, indicating a Spencer Grade 3 patent foramen ovale (PFO). Positive TCD Bubble study indicative of a small ( Spencer grade 3 ) right to left shunt *See table(s) above for TCD measurements and observations.  Diagnosing physician: Antony Contras MD Electronically signed by Antony Contras MD on 03/05/2021 at 12:59:42 PM.    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 Patient received TN K and thrombectomy. Significant improvement in her right-sided weakness and aphasia. Follows with neurology Dr. Erlinda Hong and is to see Dr. Leonie Man in clinic. 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 Neurology suggests that they feel the stroke could be paradoxical embolization from her DVT passing through her PFO.  #2 PFO Patient will follow up with cardiology t Dr. Burt Knack for further evaluation and determination regarding  PFO closure.  #3 bilateral lower extremity DVT.  Incidentally noted during work-up for possible etiology of CVA. Patient was not symptomatic from these and does not have any overt provoking events for these findings. She was noted to have these soon after hospitalization so it is less likely that these were triggered by her stroke and bedrest and more likely to have been present prior prior to this.  Plan -Reviewed patient is remaining hypercoagulability testing labs  including her JAK2 MPN panel which was unrevealing, multiple myeloma panel unremarkable with no monoclonal protein. 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 -We discussed that irrespective of her factor VIII levels that would be a role for chronic long-term anticoagulation given her lower extremity DVTs were unprovoked. -She currently has been started on aspirin for CVA prophylaxis per Dr. Leonie Man her neurologist. -Given her recent hemorrhagic transformation of the CVA we would have to await neurology clearance prior to starting her on anticoagulation. -Depending on her CNS rebleed risk we could either start her on Eliquis 2.5 mg p.o. twice daily for a week or 2 and then proceed to 5 mg p.o. twice daily.  If the bleeding risk is limited at this time she could alternatively be started on Eliquis 5 mg p.o. twice daily without the loading dose. -We will defer the timing of starting Eliquis to her neurologist. -Once the patient is stable on anticoagulation for 1 to 2 months if there is no indication of bleeding her IVC filter could potentially be removed.  This decision will need to be made after final word from cardiology regarding their plan for PFO closure.  If PFO closure is planned would await PFO closure prior to removal of IVC filter. -We shall follow-up with patient to review her remaining hypercoagulability work-up once results available.  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 No orders of the defined types were placed in this encounter.   All of the patients questions were answered with apparent satisfaction. The patient knows to call the clinic with any problems, questions or concerns.    Sullivan Lone MD [place] AAHIVMS [facility] Hematology/Oncology Physician [facility]

## 2021-03-09 NOTE — Progress Notes (Signed)
These results are communicated to the patient at the time of doing the study by myself personally

## 2021-03-10 ENCOUNTER — Ambulatory Visit
Admission: RE | Admit: 2021-03-10 | Discharge: 2021-03-10 | Disposition: A | Discharge status: Home / Self Care | Payer: Federal, State, Local not specified - PPO | Source: Ambulatory Visit | Attending: Neurology | Admitting: Neurology

## 2021-03-10 ENCOUNTER — Ambulatory Visit: Payer: Federal, State, Local not specified - PPO | Admitting: Rehabilitation

## 2021-03-10 DIAGNOSIS — I639 Cerebral infarction, unspecified: Secondary | ICD-10-CM | POA: Diagnosis not present

## 2021-03-15 ENCOUNTER — Telehealth: Payer: Self-pay

## 2021-03-15 ENCOUNTER — Other Ambulatory Visit: Payer: Self-pay | Admitting: Neurology

## 2021-03-15 ENCOUNTER — Ambulatory Visit: Payer: Federal, State, Local not specified - PPO | Admitting: Physical Therapy

## 2021-03-15 MED ORDER — APIXABAN 5 MG PO TABS
5.0000 mg | ORAL_TABLET | Freq: Two times a day (BID) | ORAL | 1 refills | Status: DC
Start: 2021-03-15 — End: 2021-06-21

## 2021-03-15 NOTE — Telephone Encounter (Signed)
-----   Message from Windell Norfolk, MD sent at 03/15/2021  4:24 PM EST ----- Please call and inform patient that head CT shows resolution of left insular hemorrhage. Per Dr. Pearlean Brownie last note, we will switch you from Aspirin to Eliquis.  Windell Norfolk, MD

## 2021-03-15 NOTE — Telephone Encounter (Signed)
I called patient. I discussed this with her. She understands that she has been switched from aspirin to eliquis and this RX has been sent to [facility]. Pt verbalized understanding of results. Pt had no questions at this time but was encouraged to call back if questions arise.

## 2021-03-17 ENCOUNTER — Ambulatory Visit: Payer: Federal, State, Local not specified - PPO | Admitting: Rehabilitation

## 2021-03-18 NOTE — Telephone Encounter (Signed)
yes

## 2021-03-22 ENCOUNTER — Ambulatory Visit: Payer: Federal, State, Local not specified - PPO | Admitting: Physical Therapy

## 2021-03-24 ENCOUNTER — Ambulatory Visit: Payer: Federal, State, Local not specified - PPO | Admitting: Rehabilitation

## 2021-03-29 ENCOUNTER — Ambulatory Visit: Payer: Federal, State, Local not specified - PPO | Admitting: Physical Therapy

## 2021-04-19 DIAGNOSIS — Z0000 Encounter for general adult medical examination without abnormal findings: Secondary | ICD-10-CM | POA: Diagnosis not present

## 2021-04-19 DIAGNOSIS — E559 Vitamin D deficiency, unspecified: Secondary | ICD-10-CM | POA: Diagnosis not present

## 2021-04-19 DIAGNOSIS — I679 Cerebrovascular disease, unspecified: Secondary | ICD-10-CM | POA: Diagnosis not present

## 2021-04-19 DIAGNOSIS — I10 Essential (primary) hypertension: Secondary | ICD-10-CM | POA: Diagnosis not present

## 2021-04-19 DIAGNOSIS — Z23 Encounter for immunization: Secondary | ICD-10-CM | POA: Diagnosis not present

## 2021-04-19 DIAGNOSIS — E7800 Pure hypercholesterolemia, unspecified: Secondary | ICD-10-CM | POA: Diagnosis not present

## 2021-04-19 DIAGNOSIS — E119 Type 2 diabetes mellitus without complications: Secondary | ICD-10-CM | POA: Diagnosis not present

## 2021-05-10 ENCOUNTER — Other Ambulatory Visit: Payer: Self-pay

## 2021-05-10 NOTE — Patient Outreach (Signed)
[employer] Customer service manager [facility]) Care Management ? ?05/10/2021 ? ?[name] ?10/08/1970 ?[phone] ? ? ?First telephone outreach attempt to obtain mRS. No answer. Left message for returned call. ? ?Vanice Sarah ?[facility]-Care Management Assistant ?[phone] ? ?

## 2021-05-13 ENCOUNTER — Other Ambulatory Visit: Payer: Self-pay

## 2021-05-13 NOTE — Patient Outreach (Signed)
[employer] Customer service manager [facility]) Care Management ? ?05/13/2021 ? ?[name] ?Mar 26, 1970 ?[phone] ? ? ?Second telephone outreach attempt to obtain mRS. No answer. Left message for returned call. ? ?Vanice Sarah ?[facility]-Care Management Assistant ?[phone] ? ?

## 2021-05-14 ENCOUNTER — Other Ambulatory Visit: Payer: Self-pay

## 2021-05-14 NOTE — Patient Outreach (Signed)
[employer] Customer service manager [facility]) Care Management ? ?05/14/2021 ? ?[name] ?08/13/1970 ?[id number] ? ? ?3 outreach attempts were completed to obtain mRs. mRs could not be obtained because patient never returned my calls. mRs=7 ?  ? ?[name] ?Care Management Assistant ?[phone] ? ?

## 2021-05-18 DIAGNOSIS — E1165 Type 2 diabetes mellitus with hyperglycemia: Secondary | ICD-10-CM | POA: Diagnosis not present

## 2021-05-18 DIAGNOSIS — I639 Cerebral infarction, unspecified: Secondary | ICD-10-CM | POA: Diagnosis not present

## 2021-05-18 DIAGNOSIS — I10 Essential (primary) hypertension: Secondary | ICD-10-CM | POA: Diagnosis not present

## 2021-05-18 DIAGNOSIS — E7800 Pure hypercholesterolemia, unspecified: Secondary | ICD-10-CM | POA: Diagnosis not present

## 2021-06-15 ENCOUNTER — Telehealth: Payer: Self-pay | Admitting: Neurology

## 2021-06-15 NOTE — Telephone Encounter (Signed)
LVM and sent mychart msg informing pt of r/s needed for 6/27 appt- Dr. Sethi out. ?

## 2021-06-17 ENCOUNTER — Other Ambulatory Visit: Payer: Self-pay | Admitting: Neurology

## 2021-06-21 ENCOUNTER — Telehealth: Payer: Self-pay | Admitting: Neurology

## 2021-06-21 NOTE — Telephone Encounter (Signed)
Pt called in requesting a refill of her ELIQUIS 5 MG TABS tablet to be sent in to the [facility] on [address]

## 2021-06-21 NOTE — Telephone Encounter (Signed)
Pt's medication has been sent to the pharmacy. I left a VM on pt's phone (as per DPR) to inform her.

## 2021-06-21 NOTE — Telephone Encounter (Signed)
Pt called back, message relayed from [place], CMA this is FYI no call back requested

## 2021-06-21 NOTE — Telephone Encounter (Signed)
Rx refilled.

## 2021-06-30 ENCOUNTER — Inpatient Hospital Stay: Payer: Federal, State, Local not specified - PPO

## 2021-06-30 ENCOUNTER — Inpatient Hospital Stay: Payer: Federal, State, Local not specified - PPO | Attending: Hematology | Admitting: Hematology

## 2021-06-30 ENCOUNTER — Other Ambulatory Visit: Payer: Self-pay

## 2021-06-30 VITALS — BP 109/71 | HR 82 | Temp 97.7°F | Resp 16 | Wt 165.2 lb

## 2021-06-30 DIAGNOSIS — Q2112 Patent foramen ovale: Secondary | ICD-10-CM | POA: Insufficient documentation

## 2021-06-30 DIAGNOSIS — E785 Hyperlipidemia, unspecified: Secondary | ICD-10-CM | POA: Insufficient documentation

## 2021-06-30 DIAGNOSIS — I6389 Other cerebral infarction: Secondary | ICD-10-CM | POA: Diagnosis not present

## 2021-06-30 DIAGNOSIS — D6859 Other primary thrombophilia: Secondary | ICD-10-CM

## 2021-06-30 DIAGNOSIS — I10 Essential (primary) hypertension: Secondary | ICD-10-CM | POA: Diagnosis not present

## 2021-06-30 DIAGNOSIS — Z823 Family history of stroke: Secondary | ICD-10-CM | POA: Diagnosis not present

## 2021-06-30 DIAGNOSIS — I824Z3 Acute embolism and thrombosis of unspecified deep veins of distal lower extremity, bilateral: Secondary | ICD-10-CM | POA: Diagnosis not present

## 2021-06-30 DIAGNOSIS — G43909 Migraine, unspecified, not intractable, without status migrainosus: Secondary | ICD-10-CM | POA: Diagnosis not present

## 2021-06-30 DIAGNOSIS — E538 Deficiency of other specified B group vitamins: Secondary | ICD-10-CM | POA: Diagnosis not present

## 2021-06-30 DIAGNOSIS — Z79899 Other long term (current) drug therapy: Secondary | ICD-10-CM | POA: Insufficient documentation

## 2021-06-30 DIAGNOSIS — I824Y3 Acute embolism and thrombosis of unspecified deep veins of proximal lower extremity, bilateral: Secondary | ICD-10-CM

## 2021-06-30 DIAGNOSIS — I82403 Acute embolism and thrombosis of unspecified deep veins of lower extremity, bilateral: Secondary | ICD-10-CM | POA: Insufficient documentation

## 2021-06-30 DIAGNOSIS — E119 Type 2 diabetes mellitus without complications: Secondary | ICD-10-CM | POA: Insufficient documentation

## 2021-06-30 DIAGNOSIS — Z8249 Family history of ischemic heart disease and other diseases of the circulatory system: Secondary | ICD-10-CM | POA: Diagnosis not present

## 2021-06-30 LAB — VITAMIN B12: Vitamin B-12: 326 pg/mL (ref 180–914)

## 2021-06-30 LAB — CMP (CANCER CENTER ONLY)
ALT: 16 U/L (ref 0–44)
AST: 14 U/L — ABNORMAL LOW (ref 15–41)
Albumin: 4.5 g/dL (ref 3.5–5.0)
Alkaline Phosphatase: 59 U/L (ref 38–126)
Anion gap: 5 (ref 5–15)
BUN: 15 mg/dL (ref 6–20)
CO2: 31 mmol/L (ref 22–32)
Calcium: 10.6 mg/dL — ABNORMAL HIGH (ref 8.9–10.3)
Chloride: 102 mmol/L (ref 98–111)
Creatinine: 0.73 mg/dL (ref 0.44–1.00)
GFR, Estimated: >60 mL/min (ref >60)
Glucose, Bld: 137 mg/dL — ABNORMAL HIGH (ref 70–99)
Potassium: 4.1 mmol/L (ref 3.5–5.1)
Sodium: 138 mmol/L (ref 135–145)
Total Bilirubin: 0.8 mg/dL (ref 0.3–1.2)
Total Protein: 7.9 g/dL (ref 6.5–8.1)

## 2021-06-30 LAB — CBC WITH DIFFERENTIAL (CANCER CENTER ONLY)
Abs Immature Granulocytes: 0.01 10*3/uL (ref 0.00–0.07)
Basophils Absolute: 0.1 10*3/uL (ref 0.0–0.1)
Basophils Relative: 1 %
Eosinophils Absolute: 0.1 10*3/uL (ref 0.0–0.5)
Eosinophils Relative: 1 %
HCT: 42.2 % (ref 36.0–46.0)
Hemoglobin: 14.1 g/dL (ref 12.0–15.0)
Immature Granulocytes: 0 %
Lymphocytes Relative: 47 %
Lymphs Abs: 3.5 10*3/uL (ref 0.7–4.0)
MCH: 30.7 pg (ref 26.0–34.0)
MCHC: 33.4 g/dL (ref 30.0–36.0)
MCV: 91.7 fL (ref 80.0–100.0)
Monocytes Absolute: 0.5 10*3/uL (ref 0.1–1.0)
Monocytes Relative: 6 %
Neutro Abs: 3.3 10*3/uL (ref 1.7–7.7)
Neutrophils Relative %: 45 %
Platelet Count: 270 10*3/uL (ref 150–400)
RBC: 4.6 MIL/uL (ref 3.87–5.11)
RDW: 12.1 % (ref 11.5–15.5)
WBC Count: 7.4 10*3/uL (ref 4.0–10.5)
nRBC: 0 % (ref 0.0–0.2)

## 2021-06-30 NOTE — Progress Notes (Signed)
[facility] Kitchen   HEMATOLOGY/ONCOLOGY CLINIC NOTE  Date of Service: 06/30/2021  Patient Care Team: Shirline Frees, MD as PCP - General (Family Medicine)  CHIEF COMPLAINTS/PURPOSE OF CONSULTATION:  Presence of IVC filter B/l DVT ?Paradoxical Embolism causing CVA  HISTORY OF PRESENTING ILLNESS:  Please see previous note for details on initial presentation  INTERVAL HISTORY [name] is a 51 y.o. female here for continued evaluation and management of unprovoked bilateral deep venous thrombosis. She presents today accompanied by her husband. She reports She is doing well with no new symptoms or concerns.  She reports having hiccups.   Cardiology has determined no need for PFO closure at this time.  We discussed the potential necessity to have IVC filter removed because she is tolerating Eliquis. We further discussed referral to interventional radiology which she was agreeable to.  No unexpected weight loss. No other new or acute focal symptoms.  Labs done today were discussed in detail. CBC WNL. CMP unremarkable. Factor 8 shows 136% B12 at 326 MM panel shows no detectable M Protein spike.  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 History of migraine headaches  SURGICAL HISTORY: Past Surgical History:  Procedure Laterality Date   BUBBLE STUDY  01/28/2021   Procedure: BUBBLE STUDY;  Surgeon: Pixie Casino, MD;  Location: [place];  Service: Cardiovascular;;   IR CT HEAD LTD  01/23/2021   IR IVC FILTER PLMT / S&I Burke Keels GUID/MOD SED  01/28/2021   IR PERCUTANEOUS ART THROMBECTOMY/INFUSION INTRACRANIAL INC DIAG ANGIO  01/23/2021   RADIOLOGY WITH ANESTHESIA N/A 01/23/2021   Procedure: IR WITH ANESTHESIA;  Surgeon: Radiologist, Medication, MD;  Location: [place];  Service: Radiology;   Laterality: N/A;   TEE WITHOUT CARDIOVERSION N/A 01/28/2021   Procedure: TRANSESOPHAGEAL ECHOCARDIOGRAM (TEE);  Surgeon: Pixie Casino, MD;  Location: [facility];  Service: Cardiovascular;  Laterality: N/A;    SOCIAL HISTORY: Social History   Socioeconomic History   Marital status: Married    Spouse name: Not on file   Number of children: Not on file   Years of education: Not on file   Highest education level: Not on file  Occupational History   Not on file  Tobacco Use   Smoking status: Never   Smokeless tobacco: Never  Vaping Use   Vaping Use: Not on file  Substance and Sexual Activity   Alcohol use: Not on file   Drug use: Not on file   Sexual activity: Not on file  Other Topics Concern   Not on file  Social History Narrative   Not on file   Social Determinants of Health   Financial Resource Strain: Not on file  Food Insecurity: Not on file  Transportation Needs: Not on file  Physical Activity: Not on file  Stress: Not on file  Social Connections: Not on file  Intimate Partner Violence: Not on file    FAMILY HISTORY: Family History  Problem Relation Age of Onset   Heart attack Mother    Atrial fibrillation Mother    Heart disease Father    Stroke Maternal Grandfather     ALLERGIES:  has No Known Allergies.  MEDICATIONS:  Current Outpatient Medications  Medication Sig Dispense Refill   atorvastatin (LIPITOR) 40 MG tablet Take 1 tablet (40 mg total) by mouth daily. 30 tablet 0   ELIQUIS 5 MG TABS tablet TAKE  1 TABLET(5 MG) BY MOUTH TWICE DAILY 60 tablet 1   gabapentin (NEURONTIN) 100 MG capsule Take 100 mg by mouth every evening.     lisinopril-hydrochlorothiazide (ZESTORETIC) 10-12.5 MG tablet Take 1 tablet by mouth every evening.     metFORMIN (GLUCOPHAGE-XR) 500 MG 24 hr tablet Take 500 mg by mouth daily with supper.     triamcinolone cream (KENALOG) 0.1 % Apply 1 application topically 2 (two) times daily as needed.     No current  facility-administered medications for this visit.    REVIEW OF SYSTEMS:   No acute new focal neurological deficits.  No chest pain or shortness of breath.    PHYSICAL EXAMINATION: Vitals:   06/30/21 1025  BP: 109/71  Pulse: 82  Resp: 16  Temp: 97.7 F (36.5 C)  SpO2: 98%    NAD GENERAL:alert, in no acute distress and comfortable SKIN: no acute rashes, no significant lesions EYES: conjunctiva are pink and non-injected, sclera anicteric NECK: supple, no JVD LYMPH:  no palpable lymphadenopathy in the cervical, axillary or inguinal regions LUNGS: clear to auscultation b/l with normal respiratory effort HEART: regular rate & rhythm ABDOMEN:  normoactive bowel sounds , non tender, not distended. Extremity: no pedal edema PSYCH: alert & oriented x 3 with fluent speech NEURO: no focal motor/sensory deficits  LABORATORY DATA:  I have reviewed the data as listed  .    Latest Ref Rng & Units 06/30/2021   10:03 AM 02/16/2021   12:28 PM 01/29/2021    2:27 AM  CBC  WBC 4.0 - 10.5 K/uL 7.4   9.9   12.5    Hemoglobin 12.0 - 15.0 g/dL 14.1   12.4   13.0    Hematocrit 36.0 - 46.0 % 42.2   36.9   36.8    Platelets 150 - 400 K/uL 270   349   184      .    Latest Ref Rng & Units 06/30/2021   10:03 AM 01/29/2021    2:27 AM 01/28/2021    3:12 AM  CMP  Glucose 70 - 99 mg/dL 137   107   118    BUN 6 - 20 mg/dL 15   13   12     Creatinine 0.44 - 1.00 mg/dL 0.73   0.66   0.59    Sodium 135 - 145 mmol/L 138   134   133    Potassium 3.5 - 5.1 mmol/L 4.1   3.6   4.5    Chloride 98 - 111 mmol/L 102   99   101    CO2 22 - 32 mmol/L 31   23   21     Calcium 8.9 - 10.3 mg/dL 10.6   9.5   9.3    Total Protein 6.5 - 8.1 g/dL 7.9      Total Bilirubin 0.3 - 1.2 mg/dL 0.8      Alkaline Phos 38 - 126 U/L 59      AST 15 - 41 U/L 14      ALT 0 - 44 U/L 16       Phosphatidylserine antibodies Order: [id number] Collected 01/28/2021 03:12    0 Result Notes     Component Ref Range & Units 4 wk ago   Phosphatydalserine, IgG 0 - 30 Units 9   Comment: (NOTE)  Performed At: [facility]  [place], [place] [id number]  Rush Farmer MD [id number]   Phosphatydalserine, IgM 0 - 30 Units <10  Phosphatydalserine, IgA 0 - 19 APS Units 1      View Full Report       Result Care Coordination   Patient Communication   Add Comments   Seen Back to Top       Cardiolipin antibodies, IgG, IgM, IgA Order: [id number] Collected 01/24/2021 11:15    0 Result Notes     Component Ref Range & Units 1 mo ago  Anticardiolipin IgG 0 - 14 GPL U/mL <9   Comment: (NOTE)                           Negative:              <15                           Indeterminate:     15 - 20                           Low-Med Positive: >20 - 80                           High Positive:         >80   Anticardiolipin IgM 0 - 12 MPL U/mL <9   Comment: (NOTE)                           Negative:              <13                           Indeterminate:     13 - 20                           Low-Med Positive: >20 - 80                           High Positive:         >80   Anticardiolipin IgA 0 - 11 APL U/mL <9   Comment: (NOTE)                           Negative:              <12                           Indeterminate:     12 - 20                           Low-Med Positive: >20 - 80                           High Positive:         >80  Performed At: [facility]  [address] [place], [place] [id number]  Rush Farmer MD [id number]      View Full Report       Result Care Coordination   Patient Communication   Add Comments   Seen Back to Top       Prothrombin gene  mutation Order: [id number] Collected 01/24/2021 11:15    0 Result Notes    Component 1 mo ago  Recommendations-PTGENE: Comment   Comment: (NOTE)  Result: c.*97G>A - Not Detected  This result is not associated with an increased risk for venous  thromboembolism. See Additional Clinical Information  and  Comments.  Additional Clinical Information:  Venous thromboembolism is a multifactorial disease influenced by  genetic, environmental, and circumstantial risk factors. The c.*97G>A  variant in the F2 gene is a genetic risk factor for venous  thromboembolism. Heterozygous carriers have a 2- to 4-fold increased  risk for venous thromboembolism. Homozygotes for the c.*97G>A variant  are rare. The annual risk of VTE in homozygotes has been reported to  be 1.1%/year. Individuals who carry both a c.*97G>A variant in the  F2 gene and a c.1601G>A (p. Arg534Gln) variant in the F5 gene  (commonly referred to as Factor V Leiden) have an approximately 20-  fold increased risk for venous thromboembolism. Risks are likely to  be even higher in more complex genotype combinations involving the  F2 c.*97G>A variant and Factor V Leiden (PMID: [id number]). Additional  risk factors include but are not limited to: deficiency of protein C,  protein S, or antithrombin III, age, female sex, personal or family  history of deep vein thromboembolism, smoking, surgery, prolonged  immobilization, malignant neoplasm, tamoxifen treatment, raloxifene  treatment, oral contraceptive use, hormone replacement therapy, and  pregnancy. Management of thrombotic risk and thrombotic events should  follow established guidelines and fit the clinical circumstance. This  result cannot predict the occurrence or recurrence of a thrombotic  event.  Comments:  Genetic counseling is recommended to discuss the potential clinical  implications of positive results, as well as recommendations for  testing family members.  Genetic Coordinators are available for health care providers to  discuss  results at [phone]).  Test Details:  Variant analyzed: c.*97G>A, previously referred to as G20210A  Methods/Limitations:  DNA analysis of the F2 gene (NM_000506.5) was performed by PCR  amplification followed by restriction enzyme  analysis. The diagnostic  sensitivity is >99%. Results must be combined with clinical  information for the most accurate interpretation. Molecular-based  testing is highly accurate, but as in any laboratory test, diagnostic  errors may occur. False positive or false negative results may occur  for reasons that include genetic variants, blood transfusions, bone  marrow transplantation, somatic or tissue-specific mosaicism,  mislabeled samples, or erroneous representation of family  relationships.  This test was developed and its performance characteristics determined  by [facility]. It has not been cleared or approved by the Food and Drug  Administration.  References:  Jamse Belfast [facility], [facility]; [facility] Professional  Practice and Guidelines Committee. Addendum: [place] consensus statement on factor V Leiden mutation  testing. Genet Med. 2021 Mar 5. doi: 10.1038/s41436-021-01108-x.  PMID: [id number].  Kristopher Oppenheim. Prothrombin Thrombophilia. 2006 Jul 25  [Updated 2021 Feb 4]. In: Tarri Glenn, Ardinger HH, Pagon RA, et al.,  editors. GeneReviews(R) [Internet]. [address] ([place]): [place], [place]; 1993-2021. Available from:  [email or web address]-brown.com/  Terrilee Files, Carla Drape, Marin Shutter CS;  [facility] Laboratory Quality Assurance Committee. Venous thromboembolism  laboratory testing (factor V Leiden and factor II c.*97G>A),  2018 update: a technical standard of the [place] ([facility]). Genet Med. 2018 Dec;20(12):1489-1498.  doi: J397249. Epub 2018 Oct 5. PMID: [id number].  Melissa  Nestor Ramp, PhD, [facility]  Jane Canary, PhD  Earlean Polka, PhD, [facility]  Fannie Knee, PhD, [facility]  Threasa Alpha, PhD, [facility]  W Gailen Shelter, PhD, [facility]  Lubertha South, PhD, [facility]  Alfredo Bach, PhD, [facility]  Performed At: [facility]  [address] [place], [place] [id number]  Katina Degree MDPhD [id number]      View Full Report       Result Care Coordination   Patient Communication   Add Comments   Seen Back to Top       Factor 5 leiden Order: [id number] Collected 01/24/2021 11:15    0 Result Notes    Component 1 mo ago  Recommendations-F5LEID: Comment   Comment: (NOTE)  Result: c.1601G>A (p.Arg534Gln) - Not Detected  This result is not associated with an increased risk for venous  thromboembolism. See Additional Clinical Information and  Comments.  Additional Clinical Information:  Venous thromboembolism is a multifactorial disease influenced by  genetic, environmental, and circumstantial risk factors. The  c.1601G>A (p. Arg534Gln) variant in the F5 gene, commonly referred to  as Factor V Leiden, is a genetic risk factor for venous  thromboembolism. Heterozygous carriers of this variant have a 6- to 8-  fold increased risk for venous thromboembolism. Individuals  homozygous for this variant (ie, with a copy of the variant on each  chromosome) have an approximately 80-fold increased risk for venous  thromboembolism. Individuals who carry both a c.*97G>A variant in the  F2 gene and Factor V Leiden have an approximately 20-fold increased  risk for venous thromboembolism. Risks are likely to be even higher  in more complex genotype combinations involving the F2 c.*97G>A  variant and Factor V Leiden (PMID: [id number]). Additional risk factors  include but are not limited to: deficiency of protein C, protein S,  or antithrombin III, age, female sex, personal or family history of  deep vein thromboembolism, smoking, surgery, prolonged  immobilization, malignant neoplasm, tamoxifen treatment, raloxifene  treatment, oral contraceptive use, hormone replacement therapy, and  pregnancy. Management of thrombotic risk and thrombotic events should  follow established guidelines and fit the clinical circumstance. This  result cannot  predict the occurrence or recurrence of a thrombotic  event.  Comment:  Genetic counseling is recommended to discuss the potential clinical  implications of positive results, as well as recommendations for  testing family members.  Genetic Coordinators are available for health care providers to  discuss results at [phone]).  Test Details:  Variant Analyzed: c.1601G>A (p. Arg534Gln), referred to as Factor V  Leiden  Methods/Limitations:  DNA analysis of the F5 gene (NM_000130.5) was performed by PCR  amplification followed by restriction enzyme analysis. The  diagnostic sensitivity is >99%. Results must be combined with  clinical information for the most accurate interpretation. Molecular-  based testing is highly accurate, but as in any laboratory test,  diagnostic errors may occur. False positive or false negative results  may occur for reasons that include genetic variants, blood  transfusions, bone marrow transplantation, somatic or tissue-specific  mosaicism, mislabeled samples, or erroneous representation of family  relationships.  This test was developed and its performance characteristics  determined by [facility]. It has not been cleared or approved by the  Food and Drug Administration.  References:  Jamse Belfast [facility], [facility]; [facility]  Professional Practice and Guidelines Committee. Addendum: [place] consensus statement on factor V Leiden  mutation testing. Genet Med. 2021 Mar  5. doi: 46.2703/J00938-182-  01108-x. PMID: 99371696.  Cherrie Gauze. Factor V Leiden Thrombophilia. 1999 May 14  (Updated 2018 Jan 4). In: Bufford Buttner, Ardinger HH, Pagon RA, et al.,  editors. GeneReviews(R) (Internet). [address] ([place]): [place] of  [place], [place]; 7893-8101. Available from:  [email or web address]-mcgee.org/  Nita Sickle, Blanca Friend, Alvie Heidelberg CS;  [facility] Laboratory Quality Assurance  Committee. Venous thromboembolism  laboratory testing (factor V Leiden and factor II c.*97G>A), 2018  update: a technical standard of the [employer] of [employer] ([facility]). Genet Med. 2018 Dec;20(12):1489-1498.  doi: 10.1038/s41436-505-609-0283-z. Epub 2018 Oct 5. PMID: 75102585.  Ernestene Mention, PhD, [facility]  Lowella Dell, PhD  Alpha Gula, PhD, [facility]  Lenis Dickinson, PhD, [facility]  Elgie Collard, PhD, [facility]  W Harlan Stains, PhD, [facility]  Elwyn Lade, PhD, [facility]  Manya Silvas, PhD, [facility]  Performed At: [facility]  [address] [place], [place] [id number]  Maurine Simmering MDPhD [id number]      View Full Report       Result Care Coordination   Patient Communication   Add Comments   Seen Back to Top       Beta-2-glycoprotein i abs, IgG/M/A Order: [id number] Collected 01/24/2021 11:15    0 Result Notes     Component Ref Range & Units 1 mo ago  Beta-2 Glyco I IgG 0 - 20 GPI IgG units <9   Comment: (NOTE)  The reference interval reflects a 3SD or 99th percentile interval,  which is thought to represent a potentially clinically significant  result in accordance with the International Consensus Statement on  the classification criteria for definitive antiphospholipid  syndrome (APS). J Thromb Haem 2006;4:295-306.   Beta-2-Glycoprotein I IgM 0 - 32 GPI IgM units <9   Comment: (NOTE)  The reference interval reflects a 3SD or 99th percentile interval,  which is thought to represent a potentially clinically significant  result in accordance with the International Consensus Statement on  the classification criteria for definitive antiphospholipid  syndrome (APS). J Thromb Haem 2006;4:295-306.  Performed At: [facility]  [address] [place], [place] [id number]  Jolene Schimke MD [id number]   Beta-2-Glycoprotein I IgA 0 - 25 GPI IgA units <9   Comment: (NOTE)  The reference interval reflects a 3SD or 99th percentile interval,  which  is thought to represent a potentially clinically significant  result in accordance with the International Consensus Statement on  the classification criteria for definitive antiphospholipid  syndrome (APS). J Thromb Haem 2006;4:295-306.      View Full Report       Result Care Coordination   Patient Communication   Add Comments   Seen Back to Top       Lupus anticoagulant panel Order: [id number] Collected 01/24/2021 11:15    Status: Edited Result - FINAL    0 Result Notes     Component Ref Range & Units 1 mo ago  PTT Lupus Anticoagulant 0.0 - 51.9 sec 29.4   DRVVT 0.0 - 47.0 sec 33.4   Lupus Anticoag Interp  Comment: VC   Comment: (NOTE)  No lupus anticoagulant was detected.  Performed At: [facility]  [address] [place], [place] [id number]  Jolene Schimke MD [id number]      View Full Report     VC=Value has a corrected status       Result Care Coordination   Patient Communication   Add Comments  Seen Back to Top       Protein S, total Order: [id number] Collected 01/24/2021 11:15    0 Result Notes     Component Ref Range & Units 1 mo ago  Protein S Ag, Total 60 - 150 % 90   Comment: (NOTE)  This test was developed and its performance characteristics  determined by [facility]. It has not been cleared or approved  by the Food and Drug Administration.  Performed At: [facility]  [place], [place] [id number]  Rush Farmer MD [id number]      View Full Report       Result Care Coordination   Patient Communication   Add Comments   Seen Back to Top       Protein S activity Order: [id number] Collected 01/24/2021 11:15    0 Result Notes     Component Ref Range & Units 1 mo ago  Protein S Activity 63 - 140 % 72   Comment: (NOTE)  Protein S activity may be falsely increased (masking an abnormal, low  result) in patients receiving direct Xa inhibitor (e.g.,  rivaroxaban, apixaban, edoxaban) or a direct  thrombin inhibitor  (e.g., dabigatran) anticoagulant treatment due to assay interference  by these drugs.  Performed At: [facility]  [place], [place] [id number]  Rush Farmer MD [id number]      View Full Report       Result Care Coordination   Patient Communication   Add Comments   Seen Back to Top        Contains abnormal data Protein C, total Order: [id number] Collected 01/24/2021 11:15    0 Result Notes     Component Ref Range & Units 1 mo ago  Protein C, Total 60 - 150 % 151 High    Comment: (NOTE)  Performed At: [facility]  [place], [place] [id number]  Rush Farmer MD [id number]      View Full Report       Result Care Coordination   Patient Communication   Add Comments   Seen Back to Top       Protein C activity Order: [id number] Collected 01/24/2021 11:15    0 Result Notes     Component Ref Range & Units 1 mo ago  Protein C Activity 73 - 180 % 148   Comment: (NOTE)  Performed At: [facility]  [place], [place] [id number]  Rush Farmer MD [id number]      View Full Report       Result Care Coordination   Patient Communication   Add Comments   Seen Back to Top       Antithrombin III Order: [id number] Collected 01/24/2021 11:15    0 Result Notes     Component Ref Range & Units 1 mo ago  AntiThromb III Func 75 - 120 % 104   Comment: Performed at [facility], [place] [address], [place], [place] [address]      RADIOGRAPHIC STUDIES: I have personally reviewed the radiological images as listed and agreed with the findings in the report. No results found.  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 Patient received TN K and  thrombectomy. Significant improvement in her right-sided weakness and aphasia. Follows with neurology Dr. Erlinda Hong and  is to see Dr. Leonie Man in clinic. 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 Neurology suggests that they feel the stroke could be paradoxical embolization from her DVT passing through her PFO.  #2 PFO Patient will follow up with cardiology t Dr. Burt Knack for further evaluation and determination regarding PFO closure.  #3 bilateral lower extremity DVT.  Incidentally noted during work-up for possible etiology of CVA. Patient was not symptomatic from these and does not have any overt provoking events for these findings. She was noted to have these soon after hospitalization so it is less likely that these were triggered by her stroke and bedrest and more likely to have been present prior prior to this.  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 CBC WNL. CMP unremarkable. Factor 8 shows has normalized at 136% B12 at 326 MM panel shows no M Protein spike. -Continue Eliquis 5 mg p.o. twice daily. -Order ultrasound bl lower exremities in 1 week. -Refer to interventional radiologist for consideration to remove IVC filter. -Phone visit in 2 weeks.  Follow-up US bilateral lower extremities in 1 week Phone visit in 2 weeks Referral to IR for consideration of IVC filter removal  ..The total time spent in the appointment was 20 minutes* .  All of the patient's questions were answered with apparent satisfaction. The patient knows to call the clinic with any problems, questions or concerns.   Sullivan Lone MD MS AAHIVMS [facility] Hematology/Oncology Physician [facility]  .*Total Encounter Time as defined by the Centers for Medicare and Medicaid Services includes, in addition to the face-to-face  time of a patient visit (documented in the note above) non-face-to-face time: obtaining and reviewing outside history, ordering and reviewing medications, tests or procedures, care coordination (communications with other health care professionals or caregivers) and documentation in the medical record.   I, Melene Muller, am acting as scribe for Dr. Sullivan Lone, MD.  .I have reviewed the above documentation for accuracy and completeness, and I agree with the above. Brunetta Genera MD

## 2021-07-01 LAB — KAPPA/LAMBDA LIGHT CHAINS
Kappa free light chain: 24.3 mg/L — ABNORMAL HIGH (ref 3.3–19.4)
Kappa, lambda light chain ratio: 2.09 — ABNORMAL HIGH (ref 0.26–1.65)
Lambda free light chains: 11.6 mg/L (ref 5.7–26.3)

## 2021-07-01 LAB — FACTOR 8 ASSAY: Coagulation Factor VIII: 136 % (ref 56–140)

## 2021-07-02 ENCOUNTER — Telehealth: Payer: Self-pay | Admitting: Hematology

## 2021-07-02 NOTE — Telephone Encounter (Signed)
Scheduled follow-up appointment per 5/31 los. Patient is aware. 

## 2021-07-05 LAB — MULTIPLE MYELOMA PANEL, SERUM
Albumin SerPl Elph-Mcnc: 3.9 g/dL (ref 2.9–4.4)
Albumin/Glob SerPl: 1.1 (ref 0.7–1.7)
Alpha 1: 0.2 g/dL (ref 0.0–0.4)
Alpha2 Glob SerPl Elph-Mcnc: 0.8 g/dL (ref 0.4–1.0)
B-Globulin SerPl Elph-Mcnc: 1.2 g/dL (ref 0.7–1.3)
Gamma Glob SerPl Elph-Mcnc: 1.3 g/dL (ref 0.4–1.8)
Globulin, Total: 3.6 g/dL (ref 2.2–3.9)
IgA: 239 mg/dL (ref 87–352)
IgG (Immunoglobin G), Serum: 1320 mg/dL (ref 586–1602)
IgM (Immunoglobulin M), Srm: 57 mg/dL (ref 26–217)
Total Protein ELP: 7.5 g/dL (ref 6.0–8.5)

## 2021-07-07 ENCOUNTER — Ambulatory Visit (HOSPITAL_COMMUNITY)
Admission: RE | Admit: 2021-07-07 | Discharge: 2021-07-07 | Disposition: A | Discharge status: Home / Self Care | Payer: Federal, State, Local not specified - PPO | Source: Ambulatory Visit | Attending: Hematology | Admitting: Hematology

## 2021-07-07 DIAGNOSIS — I824Y3 Acute embolism and thrombosis of unspecified deep veins of proximal lower extremity, bilateral: Secondary | ICD-10-CM | POA: Diagnosis not present

## 2021-07-07 NOTE — Progress Notes (Signed)
Bilateral lower extremity venous duplex has been completed. Preliminary results can be found in CV Proc through chart review.  Results were given to [facility] at Dr. Clyda Greener office.  07/07/21 9:00 AM Olen Cordial RVT

## 2021-07-13 ENCOUNTER — Inpatient Hospital Stay: Payer: Federal, State, Local not specified - PPO | Attending: Hematology | Admitting: Hematology

## 2021-07-13 DIAGNOSIS — Z95828 Presence of other vascular implants and grafts: Secondary | ICD-10-CM

## 2021-07-13 DIAGNOSIS — D6859 Other primary thrombophilia: Secondary | ICD-10-CM | POA: Diagnosis not present

## 2021-07-13 DIAGNOSIS — Z823 Family history of stroke: Secondary | ICD-10-CM | POA: Insufficient documentation

## 2021-07-13 DIAGNOSIS — I82403 Acute embolism and thrombosis of unspecified deep veins of lower extremity, bilateral: Secondary | ICD-10-CM | POA: Insufficient documentation

## 2021-07-13 DIAGNOSIS — Q2112 Patent foramen ovale: Secondary | ICD-10-CM | POA: Insufficient documentation

## 2021-07-13 DIAGNOSIS — Z7901 Long term (current) use of anticoagulants: Secondary | ICD-10-CM | POA: Diagnosis not present

## 2021-07-13 DIAGNOSIS — Z86718 Personal history of other venous thrombosis and embolism: Secondary | ICD-10-CM | POA: Diagnosis not present

## 2021-07-13 DIAGNOSIS — Z79899 Other long term (current) drug therapy: Secondary | ICD-10-CM | POA: Diagnosis not present

## 2021-07-13 DIAGNOSIS — I82433 Acute embolism and thrombosis of popliteal vein, bilateral: Secondary | ICD-10-CM | POA: Diagnosis not present

## 2021-07-13 DIAGNOSIS — Z8249 Family history of ischemic heart disease and other diseases of the circulatory system: Secondary | ICD-10-CM | POA: Diagnosis not present

## 2021-07-13 DIAGNOSIS — I6389 Other cerebral infarction: Secondary | ICD-10-CM | POA: Insufficient documentation

## 2021-07-14 ENCOUNTER — Ambulatory Visit: Payer: Federal, State, Local not specified - PPO | Admitting: Neurology

## 2021-07-14 ENCOUNTER — Encounter: Payer: Self-pay | Admitting: Neurology

## 2021-07-14 VITALS — BP 104/61 | HR 74 | Ht 63.5 in | Wt 165.0 lb

## 2021-07-14 DIAGNOSIS — Z8673 Personal history of transient ischemic attack (TIA), and cerebral infarction without residual deficits: Secondary | ICD-10-CM | POA: Diagnosis not present

## 2021-07-14 DIAGNOSIS — I82409 Acute embolism and thrombosis of unspecified deep veins of unspecified lower extremity: Secondary | ICD-10-CM

## 2021-07-14 DIAGNOSIS — Q2112 Patent foramen ovale: Secondary | ICD-10-CM | POA: Diagnosis not present

## 2021-07-14 NOTE — Patient Instructions (Addendum)
I had a long d/w patient and her husband about her recent stroke, PFO and DVT risk for recurrent stroke/TIAs, personally independently reviewed imaging studies and stroke evaluation results and answered questions.Continue Eliquis (apixaban) due 2 history of DVT for secondary stroke prevention and maintain strict control of hypertension with blood pressure goal below 130/90, diabetes with hemoglobin A1c goal below 6.5% and lipids with LDL cholesterol goal below 70 mg/dL. I also advised the patient to eat a healthy diet with plenty of whole grains, cereals, fruits and vegetables, exercise regularly and maintain ideal body weight .patient has a small PFO and ROPE score of 5 points giving her a 34% chance that stroke is due to PFO and as long as she has a need for anticoagulation with Eliquis we will hold off on PFO closure.  Followup in the future with my nurse practitioner in 6 months or call earlier if necessary.

## 2021-07-14 NOTE — Progress Notes (Signed)
[facility] [place]. [place] [address] [phone]       OFFICE CONSULT NOTE  [name] Date of Birth:  09-17-1970 Medical Record Number:  [id number]   Referring MD: Rosalin Hawking  Reason for Referral: Stroke  HPI: Initial visit 02/24/2021: [name] is a 50 year old Caucasian lady seen today for initial office consultation visit.  History is obtained from the patient and her husband as well as review of electronic medical records and opossum reviewed available pertinent imaging films in PACS.  She is a 51 year old Caucasian lady with past medical history of uncontrolled diabetes, hypertension and hyperlipidemia who presented on 01/23/2021 with sudden onset of aphasia, left gaze preference and right hemiplegia with NIH stroke scale of 18.  She was given IV TNK and CT angiogram showed left M1 thrombus and she was taken for emergent thrombectomy and underwent successful TICI 3 revascularization of inferior division of the left MCA.  She was admitted to the ICU with follow-up CT scan showed hemorrhagic transformation of the left insula and frontal region.  2D echo showed normal ejection fraction.  TEE showed a small PFO but no other cardiac source of embolism.  LDL cholesterol was 73 mg percent.  Hemoglobin A1c was elevated at 11.1.  Lower extremity venous Doppler showed acute DVT involving popliteal, soleal and gastrocnemius veins.  Patient could not be anticoagulated due to intracerebral hemorrhage and underwent an IVC filter placement.  Initial hypercoagulable panel labs were negative but subsequently she saw hematologist Dr. Irene Limbo who ordered factor VIII levels which was found to be elevated at 211 percentage units but it is unclear if this is acute phase reactant.  There is a family history of DVTs in her maternal aunt as well as her grandfather died of stroke in his 54s.  Patient had no history of recent prolonged travel via airline or road or any prior  history of DVTs or pulmonary embolism.  She was not started on any antiplatelet or anticoagulants due to hemorrhagic transformation of her stroke.  Patient actually has done quite well and her aphasia has resolved.  She is finished outpatient speech therapy.  Her physical strength has also improved back to baseline.  Her stamina has not she gets tired easily.  She is currently not working and wants paperwork to be done for [facility] but she feels she is ready to return to work part-time for a few weeks.  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  She has no prior history of strokes, TIAs, migraines seizures or any other neurological problems.  She was seen by Dr. Burt Knack earlier this week who has deferred decision about endovascular PFO closure pending final decision whether she needs long-term anticoagulation or not. Update 07/14/2021 : She returns for follow-up after last visit 4 and half months ago.  She states she is doing well.  Speech is improved pretty well.  She has only occasional minor headaches as well as hiccups but these are transient and not bothersome.  She remains on Eliquis for her DVT.  She recently saw her hematologist Dr. Irene Limbo who recommended she stay on long-term anticoagulation.  She did undergo TCD bubble study on 03/04/2021 which was positive suggestive of a small right to left shunt.  She saw Dr. Burt Knack who recommended no PFO closure as long as she is on Eliquis plus she has a very low ROPE score of 5.  She had repeat Factor VIII levels checked which were normal  normal indicating the previously elevated levels of acute phase reactant.  She is scheduled to undergo IVC filter removal soon.  She had repeat lower extremity venous Dopplers done on 07/07/2021 which showed complete resolution of the DVT and no clot was seen. ROS:   14 system review of systems is positive for aphasia, speech difficulties, weakness, DVT, bruising all other systems negative  PMH:  Past Medical History:   Diagnosis Date   Hyperlipidemia    Hypertension    Stroke [facility])     Social History:  Social History   Socioeconomic History   Marital status: Married    Spouse name: Not on file   Number of children: Not on file   Years of education: Not on file   Highest education level: Not on file  Occupational History   Not on file  Tobacco Use   Smoking status: Never   Smokeless tobacco: Never  Vaping Use   Vaping Use: Not on file  Substance and Sexual Activity   Alcohol use: Not on file   Drug use: Not on file   Sexual activity: Not on file  Other Topics Concern   Not on file  Social History Narrative   Not on file   Social Determinants of Health   Financial Resource Strain: Not on file  Food Insecurity: Not on file  Transportation Needs: Not on file  Physical Activity: Not on file  Stress: Not on file  Social Connections: Not on file  Intimate Partner Violence: Not on file    Medications:   Current Outpatient Medications on File Prior to Visit  Medication Sig Dispense Refill   atorvastatin (LIPITOR) 40 MG tablet Take 1 tablet (40 mg total) by mouth daily. 30 tablet 0   Cholecalciferol (VITAMIN D) 50 MCG (2000 UT) tablet 1 tablet     ELIQUIS 5 MG TABS tablet TAKE 1 TABLET(5 MG) BY MOUTH TWICE DAILY 60 tablet 1   empagliflozin (JARDIANCE) 25 MG TABS tablet 1 tablet     gabapentin (NEURONTIN) 100 MG capsule Take 100 mg by mouth every evening.     insulin degludec (TRESIBA FLEXTOUCH) 100 UNIT/ML FlexTouch Pen 20u     lisinopril-hydrochlorothiazide (ZESTORETIC) 10-12.5 MG tablet Take 1 tablet by mouth every evening.     Semaglutide,0.25 or 0.5MG /DOS, (OZEMPIC, 0.25 OR 0.5 MG/DOSE,) 2 MG/1.5ML SOPN 0.5mg      triamcinolone cream (KENALOG) 0.1 % Apply 1 application topically 2 (two) times daily as needed.     No current facility-administered medications on file prior to visit.    Allergies:  No Known Allergies  Physical Exam General: well developed, well nourished  middle-aged Caucasian lady, seated, in no evident distress Head: head normocephalic and atraumatic.   Neck: supple with no carotid or supraclavicular bruits Cardiovascular: regular rate and rhythm, no murmurs Musculoskeletal: no deformity Skin:  no rash/petichiae Vascular:  Normal pulses all extremities  Neurologic Exam Mental Status: Awake and fully alert. Oriented to place and time. Recent and remote memory intact. Attention span, concentration and fund of knowledge appropriate. Mood and affect appropriate.  Cranial Nerves: Fundoscopic exam not done. Pupils equal, briskly reactive to light. Extraocular movements full without nystagmus. Visual fields full to confrontation. Hearing intact. Facial sensation intact.  Mild right lower facial asymmetry when she smiles., tongue, palate moves normally and symmetrically.  Motor: Normal bulk and tone. Normal strength in all tested extremity muscles. Sensory.: intact to touch , pinprick , position and vibratory sensation.  Coordination: Rapid alternating movements normal in all  extremities. Finger-to-nose and heel-to-shin performed accurately bilaterally. Gait and Station: Arises from chair without difficulty. Stance is normal. Gait demonstrates normal stride length and balance . Able to heel, toe and tandem walk without difficulty.  Reflexes: 1+ and symmetric. Toes downgoing.      ASSESSMENT: 51 year old Caucasian lady with embolic left MCA infarct and December 2022 due to left inferior division MCA occlusion treated with IV thrombolysis with TNKase followed by successful mechanical thrombectomy but complicated by hemorrhagic transformation.  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  Subsequent hypercoagulable work-up has shown mildly elevated factor VIII levels which are of unclear significance.  Family history of stroke in father in the 49s and are not  having DVT makes hereditary thrombophilia a  possibility     PLAN: I had a long d/w patient and her husband about her recent stroke, PFO and DVT risk for recurrent stroke/TIAs, personally independently reviewed imaging studies and stroke evaluation results and answered questions.Continue Eliquis (apixaban) due 2 history of DVT for secondary stroke prevention and maintain strict control of hypertension with blood pressure goal below 130/90, diabetes with hemoglobin A1c goal below 6.5% and lipids with LDL cholesterol goal below 70 mg/dL. I also advised the patient to eat a healthy diet with plenty of whole grains, cereals, fruits and vegetables, exercise regularly and maintain ideal body weight .patient has a small PFO and ROPE score of 5 points giving her a 34% chance that stroke is due to PFO and as long as she has a need for anticoagulation with Eliquis we will hold off on PFO closure.  Followup in the future with my nurse practitioner in 6 months or call earlier if necessary. Greater than 50% time during this 35 minute  visit was spent on counseling and coordination of care about her embolic stroke, paradoxical embolism, DVT and discussion about hypercoagulability and risk benefit of anticoagulation and endovascular PFO closure and answering questions. Antony Contras, MD Note: This document was prepared with digital dictation and possible smart phrase technology. Any transcriptional errors that result from this process are unintentional.

## 2021-07-20 NOTE — Progress Notes (Signed)
Marland Kitchen   HEMATOLOGY/ONCOLOGY CLINIC NOTE  Date of Service: 07/13/2021   Patient Care Team: Johny Blamer, MD as PCP - General (Family Medicine)  CHIEF COMPLAINTS/PURPOSE OF CONSULTATION:   Follow-up for management of bilateral DVT.  Possible paradoxical embolism causing CVA Discussion of follow-up ultrasound of lower extremities  HISTORY OF PRESENTING ILLNESS:  Please see previous note for details on initial presentation  INTERVAL HISTORY  ..I connected with Trenton Gammon on 07/13/2021 at  8:40 AM EDT by telephone visit and verified that I am speaking with the correct person using two identifiers.   I discussed the limitations, risks, security and privacy concerns of performing an evaluation and management service by telemedicine and the availability of in-person appointments. I also discussed with the patient that there may be a patient responsible charge related to this service. The patient expressed understanding and agreed to proceed.   Other persons participating in the visit and their role in the encounter: non   Patient's location: home  Provider's location: [facility]   Chief Complaint: Discussion of ultrasound of lower extremity venous duplex  Patient notes no new symptoms since her last clinic visit.  We discussed her repeat lower extremity venous duplex ultrasound done on 07/07/2021 which showed no evidence of residual DVT bilaterally.  We discussed and patient is agreeable to proceed with consideration of IVC filter removal.  She continues to tolerate her Eliquis well with no issues with bleeding.   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 History of migraine headaches  SURGICAL HISTORY: Past Surgical History:  Procedure Laterality Date   BUBBLE STUDY  01/28/2021    Procedure: BUBBLE STUDY;  Surgeon: Chrystie Nose, MD;  Location: [facility] ENDOSCOPY;  Service: Cardiovascular;;   IR CT HEAD LTD  01/23/2021   IR IVC FILTER PLMT / S&I Lenise Arena GUID/MOD SED  01/28/2021   IR PERCUTANEOUS ART THROMBECTOMY/INFUSION INTRACRANIAL INC DIAG ANGIO  01/23/2021   RADIOLOGY WITH ANESTHESIA N/A 01/23/2021   Procedure: IR WITH ANESTHESIA;  Surgeon: Radiologist, Medication, MD;  Location: [facility] OR;  Service: Radiology;  Laterality: N/A;   TEE WITHOUT CARDIOVERSION N/A 01/28/2021   Procedure: TRANSESOPHAGEAL ECHOCARDIOGRAM (TEE);  Surgeon: Chrystie Nose, MD;  Location: [facility] ENDOSCOPY;  Service: Cardiovascular;  Laterality: N/A;    SOCIAL HISTORY: Social History   Socioeconomic History   Marital status: Married    Spouse name: Not on file   Number of children: Not on file   Years of education: Not on file   Highest education level: Not on file  Occupational History   Not on file  Tobacco Use   Smoking status: Never   Smokeless tobacco: Never  Vaping Use   Vaping Use: Not on file  Substance and Sexual Activity   Alcohol use: Not on file   Drug use: Not on file   Sexual activity: Not on file  Other Topics Concern   Not on file  Social History Narrative   Not on file   Social Determinants of Health   Financial Resource Strain: Not on file  Food Insecurity: Not on file  Transportation Needs: Not on file  Physical Activity: Not on file  Stress: Not on file  Social Connections: Not on file  Intimate Partner Violence: Not on file    FAMILY HISTORY: Family History  Problem Relation Age of Onset   Heart attack Mother  Atrial fibrillation Mother    Heart disease Father    Stroke Maternal Grandfather     ALLERGIES:  has No Known Allergies.  MEDICATIONS:  Current Outpatient Medications  Medication Sig Dispense Refill   atorvastatin (LIPITOR) 40 MG tablet Take 1 tablet (40 mg total) by mouth daily. 30 tablet 0   Cholecalciferol (VITAMIN D) 50 MCG (2000 UT)  tablet 1 tablet     ELIQUIS 5 MG TABS tablet TAKE 1 TABLET(5 MG) BY MOUTH TWICE DAILY 60 tablet 1   empagliflozin (JARDIANCE) 25 MG TABS tablet 1 tablet     gabapentin (NEURONTIN) 100 MG capsule Take 100 mg by mouth every evening.     insulin degludec (TRESIBA FLEXTOUCH) 100 UNIT/ML FlexTouch Pen 20u     lisinopril-hydrochlorothiazide (ZESTORETIC) 10-12.5 MG tablet Take 1 tablet by mouth every evening.     Semaglutide,0.25 or 0.5MG /DOS, (OZEMPIC, 0.25 OR 0.5 MG/DOSE,) 2 MG/1.5ML SOPN 0.5mg      triamcinolone cream (KENALOG) 0.1 % Apply 1 application topically 2 (two) times daily as needed.     No current facility-administered medications for this visit.    REVIEW OF SYSTEMS:   No new focal neurological deficits, no chest pain or shortness of breath.  No new leg pain or swelling.  PHYSICAL EXAMINATION: Telemedicine visit  LABORATORY DATA:  I have reviewed the data as listed  .    Latest Ref Rng & Units 06/30/2021   10:03 AM 02/16/2021   12:28 PM 01/29/2021    2:27 AM  CBC  WBC 4.0 - 10.5 K/uL 7.4  9.9  12.5   Hemoglobin 12.0 - 15.0 g/dL 45.8  09.9  83.3   Hematocrit 36.0 - 46.0 % 42.2  36.9  36.8   Platelets 150 - 400 K/uL 270  349  184     .    Latest Ref Rng & Units 06/30/2021   10:03 AM 01/29/2021    2:27 AM 01/28/2021    3:12 AM  CMP  Glucose 70 - 99 mg/dL 825  053  976   BUN 6 - 20 mg/dL 15  13  12    Creatinine 0.44 - 1.00 mg/dL  7.34  1.93   Sodium 135 - 145 mmol/L 138  134  133   Potassium 3.5 - 5.1 mmol/L 4.1  3.6  4.5   Chloride 98 - 111 mmol/L 102  99  101   CO2 22 - 32 mmol/L 31  23  21    Calcium 8.9 - 10.3 mg/dL 7.90  9.5  9.3   Total Protein 6.5 - 8.1 g/dL 7.9     Total Bilirubin 0.3 - 1.2 mg/dL 0.8     Alkaline Phos 38 - 126 U/L 59     AST 15 - 41 U/L 14     ALT 0 - 44 U/L 16      Phosphatidylserine antibodies Order: Collected 01/28/2021 03:12    0 Result Notes     Component Ref Range & Units 4 wk ago  Phosphatydalserine, IgG 0 - 30  Units 9   Comment: (NOTE)  Performed At: [facility]  [address] [place], [address] [place]  [place] [id number]   Phosphatydalserine, IgM 0 - 30 Units <10   Phosphatydalserine, IgA 0 - 19 APS Units 1      View Full Report       Result Care Coordination   Patient Communication   Add Comments   Seen Back to Top  Cardiolipin antibodies, IgG, IgM, IgA Order: [id number] Collected 01/24/2021 11:15    0 Result Notes     Component Ref Range & Units 1 mo ago  Anticardiolipin IgG 0 - 14 GPL U/mL <9   Comment: (NOTE)                           Negative:              <15                           Indeterminate:     15 - 20                           Low-Med Positive: >20 - 80                           High Positive:         >80   Anticardiolipin IgM 0 - 12 MPL U/mL <9   Comment: (NOTE)                           Negative:              <13                           Indeterminate:     13 - 20                           Low-Med Positive: >20 - 80                           High Positive:         >80   Anticardiolipin IgA 0 - 11 APL U/mL <9   Comment: (NOTE)                           Negative:              <12                           Indeterminate:     12 - 20                           Low-Med Positive: >20 - 80                           High Positive:         >80  Performed At: [facility]  [address] [place], [place] [id number]  Jolene Schimke MD NW:[id number]      View Full Report       Result Care Coordination   Patient Communication   Add Comments   Seen Back to Top       Prothrombin gene mutation Order: [id number] Collected 01/24/2021 11:15    0 Result Notes    Component 1 mo ago  Recommendations-PTGENE: Comment   Comment: (NOTE)  Result: c.*97G>A - Not Detected  This result is not associated with an increased risk for venous  thromboembolism. See Additional Clinical Information and  Comments.  Additional  Clinical Information:  Venous thromboembolism is a multifactorial disease influenced by  genetic, environmental, and circumstantial risk factors. The c.*97G>A  variant in the F2 gene is a genetic risk factor for venous  thromboembolism. Heterozygous carriers have a 2- to 4-fold increased  risk for venous thromboembolism. Homozygotes for the c.*97G>A variant  are rare. The annual risk of VTE in homozygotes has been reported to  be 1.1%/year. Individuals who carry both a c.*97G>A variant in the  F2 gene and a c.1601G>A (p. Arg534Gln) variant in the F5 gene  (commonly referred to as Factor V Leiden) have an approximately 20-  fold increased risk for venous thromboembolism. Risks are likely to  be even higher in more complex genotype combinations involving the  F2 c.*97G>A variant and Factor V Leiden (PMID: 9604540933674767). Additional  risk factors include but are not limited to: deficiency of protein C,  protein S, or antithrombin III, age, female sex, personal or family  history of deep vein thromboembolism, smoking, surgery, prolonged  immobilization, malignant neoplasm, tamoxifen treatment, raloxifene  treatment, oral contraceptive use, hormone replacement therapy, and  pregnancy. Management of thrombotic risk and thrombotic events should  follow established guidelines and fit the clinical circumstance. This  result cannot predict the occurrence or recurrence of a thrombotic  event.  Comments:  Genetic counseling is recommended to discuss the potential clinical  implications of positive results, as well as recommendations for  testing family members.  Genetic Coordinators are available for health care providers to  discuss  results at [phone]  Test Details:  Variant analyzed: c.*97G>A, previously referred to as G20210A  Methods/Limitations:  DNA analysis of the F2 gene (NM_000506.5) was performed by PCR  amplification followed by restriction enzyme analysis. The diagnostic   sensitivity is >99%. Results must be combined with clinical  information for the most accurate interpretation. Molecular-based  testing is highly accurate, but as in any laboratory test, diagnostic  errors may occur. False positive or false negative results may occur  for reasons that include genetic variants, blood transfusions, bone  marrow transplantation, somatic or tissue-specific mosaicism,  mislabeled samples, or erroneous representation of family  relationships.  This test was developed and its performance characteristics determined  by [facility]. It has not been cleared or approved by the Food and Drug  Administration.  References:  Dewitt HoesBhatt S, Taylor AK, Lozano R, Grody [facility] Medical CenterWW, Valentina LucksGriffin Zambarano Memorial HospitalJH; A[facility]G Professional  Practice and Guidelines Committee. Addendum: [employer] College of  Medical Genetics consensus statement on factor V Leiden mutation  testing. Genet Med. 2021 Mar 5. doi: 10.1038/s41436-021-01108-x.  PMID: 1478295633674767.  Cherrie GauzeKujovich JL. Prothrombin Thrombophilia. 2006 Jul 25  [Updated 2021 Feb 4]. In: Bufford ButtnerAdam MP, Ardinger HH, Pagon RA, et al.,  editors. GeneReviews(R) [Internet]. [address] [place], [place]; 2130-86571993-2021. Available from:  https://www.dunlap.com/https://www.ncbi.nlm.nih.gov/books/NBK1148/  Nita SickleZhang S, Taylor AK, Blanca FriendHuang X, Luo B, Spector EB, Alvie HeidelbergFang P, Richards CS;  [facility] Laboratory Quality Assurance Committee. Venous thromboembolism  laboratory testing (factor V Leiden and factor II c.*97G>A),  2018 update: a technical standard of the [employer]an College of [employer]  Genetics and Genomics ([facility]MG). Genet Med. 2018 Dec;20(12):1489-1498.  doi: 10.1038/s41436-(614) 146-9453-z. Epub 2018 Oct 5. PMID: 8469629530297698.  Ernestene MentionMelissa A Hayden, PhD, [facility] Center[facility]AC[facility]  Lowella DellYanjun Jiang, PhD  Alpha GulaJoseph B Kearney, PhD, [facility]  Lenis DickinsonBinu Porath, PhD, [facility]  Elgie Collardoni R Prezant, PhD, [facility]  W Harlan Stainshristine Spence, PhD, [facility]  Elwyn LadeJane W Thuo, PhD, [facility]  Manya SilvasAlecia Willis, PhD, [facility]  Performed At:  [facility]  [address] [place], [place] [id number]   Maurine Simmering MDPhD AS:[id number]      View Full Report       Result Care Coordination   Patient Communication   Add Comments   Seen Back to Top       Factor 5 leiden Order: [id number] Collected 01/24/2021 11:15    0 Result Notes    Component 1 mo ago  Recommendations-F5LEID: Comment   Comment: (NOTE)  Result: c.1601G>A (p.Arg534Gln) - Not Detected  This result is not associated with an increased risk for venous  thromboembolism. See Additional Clinical Information and  Comments.  Additional Clinical Information:  Venous thromboembolism is a multifactorial disease influenced by  genetic, environmental, and circumstantial risk factors. The  c.1601G>A (p. Arg534Gln) variant in the F5 gene, commonly referred to  as Factor V Leiden, is a genetic risk factor for venous  thromboembolism. Heterozygous carriers of this variant have a 6- to 8-  fold increased risk for venous thromboembolism. Individuals  homozygous for this variant (ie, with a copy of the variant on each  chromosome) have an approximately 80-fold increased risk for venous  thromboembolism. Individuals who carry both a c.*97G>A variant in the  F2 gene and Factor V Leiden have an approximately 20-fold increased  risk for venous thromboembolism. Risks are likely to be even higher  in more complex genotype combinations involving the F2 c.*97G>A  variant and Factor V Leiden (PMID: 97353299). Additional risk factors  include but are not limited to: deficiency of protein C, protein S,  or antithrombin III, age, female sex, personal or family history of  deep vein thromboembolism, smoking, surgery, prolonged  immobilization, malignant neoplasm, tamoxifen treatment, raloxifene  treatment, oral contraceptive use, hormone replacement therapy, and  pregnancy. Management of thrombotic risk and thrombotic events should  follow established guidelines and fit the clinical circumstance. This  result cannot predict the occurrence  or recurrence of a thrombotic  event.  Comment:  Genetic counseling is recommended to discuss the potential clinical  implications of positive results, as well as recommendations for  testing family members.  Genetic Coordinators are available for health care providers to  discuss results at [phone]).  Test Details:  Variant Analyzed: c.1601G>A (p. Arg534Gln), referred to as Factor V  Leiden  Methods/Limitations:  DNA analysis of the F5 gene (NM_000130.5) was performed by PCR  amplification followed by restriction enzyme analysis. The  diagnostic sensitivity is >99%. Results must be combined with  clinical information for the most accurate interpretation. Molecular-  based testing is highly accurate, but as in any laboratory test,  diagnostic errors may occur. False positive or false negative results  may occur for reasons that include genetic variants, blood  transfusions, bone marrow transplantation, somatic or tissue-specific  mosaicism, mislabeled samples, or erroneous representation of family  relationships.  This test was developed and its performance characteristics  determined by [facility]. It has not been cleared or approved by the  Food and Drug Administration.  References:  Dewitt Hoes [facility], Valentina Lucks [facility]; ACMG  Professional Practice and Guidelines Committee. Addendum: [employer] of Medical Genetics consensus statement on factor V Leiden  mutation testing. Genet Med. 2021 Mar 5. doi: 83.4196/Q22979-892-  01108-x. PMID: 11941740.  Cherrie Gauze. Factor V Leiden Thrombophilia. 1999 May 14  (Updated 2018 Jan 4). In: Bufford Buttner, Ardinger HH, Pagon RA, et al.,  editors. GeneReviews(R) (Internet). [address] ([place]): [place]  [place], [place]; 8144-8185. Available from:  https://harris-mcgee.org/  Natividad Brood, Alvie Heidelberg CS;  [facility] Laboratory Quality Assurance Committee. Venous  thromboembolism  laboratory testing (factor V Leiden and factor II c.*97G>A), 2018  update: a technical standard of the [employer] of [employer] and Genomics ([facility]MG). Genet Med. 2018 Dec;20(12):1489-1498.  doi: 10.1038/s41436-954-201-7765-z. Epub 2018 Oct 5. PMID: 60454098.  Ernestene Mention, PhD, [facility]  Lowella Dell, PhD  Alpha Gula, PhD, [facility]  Lenis Dickinson, PhD, [facility]  Elgie Collard, PhD, [facility]  W Harlan Stains, PhD, [facility]  Elwyn Lade, PhD, [facility]  Manya Silvas, PhD, [facility]  Performed At: [facility]  [address] [place], [place] [id number]  Maurine Simmering MDPhD FA:[id number]      View Full Report       Result Care Coordination   Patient Communication   Add Comments   Seen Back to Top       Beta-2-glycoprotein i abs, IgG/M/A Order: [id number] Collected 01/24/2021 11:15    0 Result Notes     Component Ref Range & Units 1 mo ago  Beta-2 Glyco I IgG 0 - 20 GPI IgG units <9   Comment: (NOTE)  The reference interval reflects a 3SD or 99th percentile interval,  which is thought to represent a potentially clinically significant  result in accordance with the International Consensus Statement on  the classification criteria for definitive antiphospholipid  syndrome (APS). J Thromb Haem 2006;4:295-306.   Beta-2-Glycoprotein I IgM 0 - 32 GPI IgM units <9   Comment: (NOTE)  The reference interval reflects a 3SD or 99th percentile interval,  which is thought to represent a potentially clinically significant  result in accordance with the International Consensus Statement on  the classification criteria for definitive antiphospholipid  syndrome (APS). J Thromb Haem 2006;4:295-306.  Performed At: [facility]  [address] [place], [place] [address]  Jolene Schimke MD VO:[id number]   Beta-2-Glycoprotein I IgA 0 - 25 GPI IgA units <9   Comment: (NOTE)  The reference interval reflects a 3SD or 99th percentile interval,  which is thought to  represent a potentially clinically significant  result in accordance with the International Consensus Statement on  the classification criteria for definitive antiphospholipid  syndrome (APS). J Thromb Haem 2006;4:295-306.      View Full Report       Result Care Coordination   Patient Communication   Add Comments   Seen Back to Top       Lupus anticoagulant panel Order: [id number] Collected 01/24/2021 11:15    Status: Edited Result - FINAL    0 Result Notes     Component Ref Range & Units 1 mo ago  PTT Lupus Anticoagulant 0.0 - 51.9 sec 29.4   DRVVT 0.0 - 47.0 sec 33.4   Lupus Anticoag Interp  Comment: VC   Comment: (NOTE)  No lupus anticoagulant was detected.  Performed At: [facility]  [address] [place], [place] [address]  Jolene Schimke MD OA:[id number]      View Full Report     VC=Value has a corrected status       Result Care Coordination   Patient Communication   Add Comments   Seen Back to Top       Protein S, total Order: [id number] Collected 01/24/2021 11:15    0 Result Notes     Component Ref Range & Units 1 mo ago  Protein S Ag, Total 60 - 150 % 90  Comment: (NOTE)  This test was developed and its performance characteristics  determined by [facility]. It has not been cleared or approved  by the Food and Drug Administration.  Performed At: [facility]  [address] [place], [place] [id number]  Jolene Schimke MD RC:[id number]      View Full Report       Result Care Coordination   Patient Communication   Add Comments   Seen Back to Top       Protein S activity Order: [id number] Collected 01/24/2021 11:15    0 Result Notes     Component Ref Range & Units 1 mo ago  Protein S Activity 63 - 140 % 72   Comment: (NOTE)  Protein S activity may be falsely increased (masking an abnormal, low  result) in patients receiving direct Xa inhibitor (e.g.,  rivaroxaban, apixaban, edoxaban) or a direct thrombin  inhibitor  (e.g., dabigatran) anticoagulant treatment due to assay interference  by these drugs.  Performed At: [facility]  [address] [place], [place] [id number]  Jolene Schimke MD WE:[id number]      View Full Report       Result Care Coordination   Patient Communication   Add Comments   Seen Back to Top        Contains abnormal data Protein C, total Order: [id number] Collected 01/24/2021 11:15    0 Result Notes     Component Ref Range & Units 1 mo ago  Protein C, Total 60 - 150 % 151 High    Comment: (NOTE)  Performed At: [facility]  [address] [place], [place] [id number]  Jolene Schimke MD PJ:[id number]      View Full Report       Result Care Coordination   Patient Communication   Add Comments   Seen Back to Top       Protein C activity Order: [id number] Collected 01/24/2021 11:15    0 Result Notes     Component Ref Range & Units 1 mo ago  Protein C Activity 73 - 180 % 148   Comment: (NOTE)  Performed At: [facility]  [address] [place], [place] [id number]  Jolene Schimke MD JM:[id number]      View Full Report       Result Care Coordination   Patient Communication   Add Comments   Seen Back to Top       Antithrombin III Order: [id number] Collected 01/24/2021 11:15    0 Result Notes     Component Ref Range & Units 1 mo ago  AntiThromb III Func 75 - 120 % 104   Comment: Performed at [facility], [address], [place], [place] [address]      RADIOGRAPHIC STUDIES: I have personally reviewed the radiological images as listed and agreed with the findings in the report. VAS [place] LOWER EXTREMITY VENOUS (DVT)  Result Date: 07/07/2021  Lower Venous DVT Study Patient Name:  BLANCHE SCOVELL  Date of Exam:   07/07/2021 Medical Rec #: [id number]        Accession #:    [id number] Date of Birth: 04-05-1970       Patient Gender: F Patient Age:   51 years Exam Location:  [facility] Procedure:       VAS [place] LOWER EXTREMITY VENOUS (DVT) Referring Phys: Wyvonnia Lora --------------------------------------------------------------------------------  Indications: Follow up DVT.  Risk Factors: DVT. Anticoagulation: Eliquis. Comparison Study: 01/24/2021 - RIGHT:                   -  Findings consistent with acute deep vein thrombosis                   involving the right                   popliteal vein, right soleal veins, and right gastrocnemius                   veins.                   - No cystic structure found in the popliteal fossa.                    LEFT:                   - Findings consistent with acute deep vein thrombosis                   involving the left                   gastrocnemius veins.                   - No cystic structure found in the popliteal fossa. Performing Technologist: Chanda Busing RVT  Examination Guidelines: A complete evaluation includes B-mode imaging, spectral Doppler, color Doppler, and power Doppler as needed of all accessible portions of each vessel. Bilateral testing is considered an integral part of a complete examination. Limited examinations for reoccurring indications may be performed as noted. The reflux portion of the exam is performed with the patient in reverse Trendelenburg.  +---------+---------------+---------+-----------+----------+--------------+ RIGHT    CompressibilityPhasicitySpontaneityPropertiesThrombus Aging +---------+---------------+---------+-----------+----------+--------------+ CFV      Full           Yes      Yes                                 +---------+---------------+---------+-----------+----------+--------------+ SFJ      Full                                                        +---------+---------------+---------+-----------+----------+--------------+ FV Prox  Full                                                        +---------+---------------+---------+-----------+----------+--------------+ FV Mid   Full                                                         +---------+---------------+---------+-----------+----------+--------------+ FV DistalFull                                                        +---------+---------------+---------+-----------+----------+--------------+ PFV      Full                                                        +---------+---------------+---------+-----------+----------+--------------+  POP      Full           Yes      Yes                                 +---------+---------------+---------+-----------+----------+--------------+ PTV      Full                                                        +---------+---------------+---------+-----------+----------+--------------+ PERO     Full                                                        +---------+---------------+---------+-----------+----------+--------------+ Soleal   Full                                                        +---------+---------------+---------+-----------+----------+--------------+ Gastroc  Full                                                        +---------+---------------+---------+-----------+----------+--------------+   +---------+---------------+---------+-----------+----------+--------------+ LEFT     CompressibilityPhasicitySpontaneityPropertiesThrombus Aging +---------+---------------+---------+-----------+----------+--------------+ CFV      Full           Yes      Yes                                 +---------+---------------+---------+-----------+----------+--------------+ SFJ      Full                                                        +---------+---------------+---------+-----------+----------+--------------+ FV Prox  Full                                                        +---------+---------------+---------+-----------+----------+--------------+ FV Mid   Full                                                         +---------+---------------+---------+-----------+----------+--------------+ FV DistalFull                                                        +---------+---------------+---------+-----------+----------+--------------+  PFV      Full                                                        +---------+---------------+---------+-----------+----------+--------------+ POP      Full           Yes      Yes                                 +---------+---------------+---------+-----------+----------+--------------+ PTV      Full                                                        +---------+---------------+---------+-----------+----------+--------------+ PERO     Full                                                        +---------+---------------+---------+-----------+----------+--------------+ Gastroc  Full                                                        +---------+---------------+---------+-----------+----------+--------------+     Summary: RIGHT: - There is no evidence of deep vein thrombosis in the lower extremity.  - No cystic structure found in the popliteal fossa.  LEFT: - There is no evidence of deep vein thrombosis in the lower extremity.  - No cystic structure found in the popliteal fossa.  *See table(s) above for measurements and observations. Electronically signed by Coral Else MD on 07/07/2021 at 10:50:12 PM.    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 Patient received TN K and thrombectomy. Significant improvement in her right-sided weakness and aphasia. Follows with neurology Dr. Roda Shutters and is to see Dr. Pearlean Brownie in clinic. Currently not on aspirin Plavix or any blood thinners due to the hemorrhagic transformation with her  CVA. Neurology suggests that they feel the stroke could be paradoxical embolization from her DVT passing through her PFO.  #2 PFO Patient will follow up with cardiology t Dr. Excell Seltzer for further evaluation and determination regarding PFO closure.  #3 bilateral lower extremity DVT.  Incidentally noted during work-up for possible etiology of CVA. Patient was not symptomatic from these and does not have any overt provoking events for these findings. She was noted to have these soon after hospitalization so it is less likely that these were triggered by her stroke and bedrest and more likely to have been present prior prior to this.  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 She will continue her Eliquis which she is tolerating without any bleeding issues. -Patient is agreeable and we will refer her to IR for consideration of IVC filter removal at this time. -Continue long-term anticoagulation with primary care physician. Reconsult [place] as needed  Follow-up IR Referral for consideration of IVC filter removal RTC with Dr Candise Che as needed  The total time spent in the appointment was 10 minutes*.  All of the patient's questions were answered with apparent satisfaction. The patient knows to call the clinic with any problems, questions or concerns.   Wyvonnia Lora MD MS AAHIVMS [facility] Hematology/Oncology Physician [facility]  .*Total Encounter Time as defined by the Centers for Medicare and Medicaid Services includes, in addition to the face-to-face time of a patient visit (documented in the note above) non-face-to-face time: obtaining and reviewing outside history, ordering and reviewing medications, tests or procedures, care coordination (communications with other health care professionals or caregivers)  and documentation in the medical record.

## 2021-07-27 ENCOUNTER — Ambulatory Visit: Payer: Federal, State, Local not specified - PPO | Admitting: Neurology

## 2021-08-02 ENCOUNTER — Other Ambulatory Visit (HOSPITAL_COMMUNITY): Payer: Self-pay | Admitting: Physician Assistant

## 2021-08-04 ENCOUNTER — Other Ambulatory Visit: Payer: Self-pay

## 2021-08-04 ENCOUNTER — Ambulatory Visit (HOSPITAL_COMMUNITY)
Admission: RE | Admit: 2021-08-04 | Discharge: 2021-08-04 | Disposition: A | Discharge status: Home / Self Care | Payer: Federal, State, Local not specified - PPO | Source: Ambulatory Visit | Attending: Hematology | Admitting: Hematology

## 2021-08-04 ENCOUNTER — Encounter (HOSPITAL_COMMUNITY): Payer: Self-pay | Admitting: *Deleted

## 2021-08-04 DIAGNOSIS — Z794 Long term (current) use of insulin: Secondary | ICD-10-CM | POA: Insufficient documentation

## 2021-08-04 DIAGNOSIS — Z8673 Personal history of transient ischemic attack (TIA), and cerebral infarction without residual deficits: Secondary | ICD-10-CM | POA: Diagnosis not present

## 2021-08-04 DIAGNOSIS — Z7984 Long term (current) use of oral hypoglycemic drugs: Secondary | ICD-10-CM | POA: Insufficient documentation

## 2021-08-04 DIAGNOSIS — Z4589 Encounter for adjustment and management of other implanted devices: Secondary | ICD-10-CM | POA: Diagnosis not present

## 2021-08-04 DIAGNOSIS — I10 Essential (primary) hypertension: Secondary | ICD-10-CM | POA: Insufficient documentation

## 2021-08-04 DIAGNOSIS — E785 Hyperlipidemia, unspecified: Secondary | ICD-10-CM | POA: Diagnosis not present

## 2021-08-04 DIAGNOSIS — Z86718 Personal history of other venous thrombosis and embolism: Secondary | ICD-10-CM | POA: Insufficient documentation

## 2021-08-04 DIAGNOSIS — Z7985 Long-term (current) use of injectable non-insulin antidiabetic drugs: Secondary | ICD-10-CM | POA: Insufficient documentation

## 2021-08-04 DIAGNOSIS — Z452 Encounter for adjustment and management of vascular access device: Secondary | ICD-10-CM | POA: Diagnosis not present

## 2021-08-04 DIAGNOSIS — Z95828 Presence of other vascular implants and grafts: Secondary | ICD-10-CM

## 2021-08-04 DIAGNOSIS — I82433 Acute embolism and thrombosis of popliteal vein, bilateral: Secondary | ICD-10-CM

## 2021-08-04 DIAGNOSIS — D6859 Other primary thrombophilia: Secondary | ICD-10-CM

## 2021-08-04 DIAGNOSIS — E119 Type 2 diabetes mellitus without complications: Secondary | ICD-10-CM | POA: Diagnosis not present

## 2021-08-04 HISTORY — PX: IR IVC FILTER RETRIEVAL / S&I /IMG GUID/MOD SED: IMG5308

## 2021-08-04 LAB — GLUCOSE, CAPILLARY: Glucose-Capillary: 148 mg/dL — ABNORMAL HIGH (ref 70–99)

## 2021-08-04 MED ORDER — SODIUM CHLORIDE 0.9 % IV SOLN: INTRAVENOUS | Status: DC

## 2021-08-04 MED ORDER — MIDAZOLAM HCL 2 MG/2ML IJ SOLN
INTRAMUSCULAR | Status: AC
  Filled 2021-08-04: qty 2

## 2021-08-04 MED ORDER — FENTANYL CITRATE (PF) 100 MCG/2ML IJ SOLN
INTRAMUSCULAR | Status: DC | PRN
  Administered 2021-08-04: 50 ug via INTRAVENOUS
  Administered 2021-08-04: 25 ug via INTRAVENOUS

## 2021-08-04 MED ORDER — MIDAZOLAM HCL 2 MG/2ML IJ SOLN
INTRAMUSCULAR | Status: DC | PRN
  Administered 2021-08-04: 1 mg via INTRAVENOUS
  Administered 2021-08-04: .5 mg via INTRAVENOUS
  Administered 2021-08-04: 1 mg via INTRAVENOUS

## 2021-08-04 MED ORDER — LIDOCAINE HCL 1 % IJ SOLN
INTRAMUSCULAR | Status: AC
  Filled 2021-08-04: qty 20

## 2021-08-04 MED ORDER — FENTANYL CITRATE (PF) 100 MCG/2ML IJ SOLN
INTRAMUSCULAR | Status: AC
  Filled 2021-08-04: qty 2

## 2021-08-04 NOTE — H&P (Signed)
Chief Complaint: IVC Filter retrieval   Referring Physician(s): Dr. Merrily Pew  Supervising Physician: Roanna Banning  Patient Status: [facility] - Out-pt  History of Present Illness: [name] is a 51 y.o. female  with medical issues incluging HTN, uncontrolled DM2, and HLD.  She presented as a CODE STROKE 01/23/21.   She underwent M1 thrombectomy with Dr. Corliss Skains with successful removal of her clot.   Brain MRI revealed petechial hemorrhage and small SAH left cerebral hemisphere.  She was also found to have bilateral LE DVT.   She was unable to be anticoagulated due to the hemorrhage.  She underwent placement of an IVC filter on 01/28/2022 by Dr. Milford Cage.  She is now safely on anticoagulation and is here today for retrieval of her  filter.  She is NPO. No nausea/vomiting. No Fever/chills. ROS negative.  Past Medical History:  Diagnosis Date   Hyperlipidemia    Hypertension    Stroke [facility])     Past Surgical History:  Procedure Laterality Date   BUBBLE STUDY  01/28/2021   Procedure: BUBBLE STUDY;  Surgeon: Chrystie Nose, MD;  Location: [facility] ENDOSCOPY;  Service: Cardiovascular;;   IR CT HEAD LTD  01/23/2021   IR IVC FILTER PLMT / S&I Lenise Arena GUID/MOD SED  01/28/2021   IR PERCUTANEOUS ART THROMBECTOMY/INFUSION INTRACRANIAL INC DIAG ANGIO  01/23/2021   RADIOLOGY WITH ANESTHESIA N/A 01/23/2021   Procedure: IR WITH ANESTHESIA;  Surgeon: Radiologist, Medication, MD;  Location: [facility] OR;  Service: Radiology;  Laterality: N/A;   TEE WITHOUT CARDIOVERSION N/A 01/28/2021   Procedure: TRANSESOPHAGEAL ECHOCARDIOGRAM (TEE);  Surgeon: Chrystie Nose, MD;  Location: [facility] ENDOSCOPY;  Service: Cardiovascular;  Laterality: N/A;    Allergies: Patient has no known allergies.  Medications: Prior to Admission medications   Medication Sig Start Date End Date Taking? Authorizing Provider  atorvastatin (LIPITOR) 40 MG tablet Take 1 tablet (40 mg total) by mouth daily. Patient taking  differently: Take 40 mg by mouth at bedtime. 01/30/21  Yes Shafer, Ludger Nutting, NP  Cholecalciferol (VITAMIN D) 50 MCG (2000 UT) tablet Take 2,000 Units by mouth daily.   Yes [provider]  ELIQUIS 5 MG TABS tablet TAKE 1 TABLET(5 MG) BY MOUTH TWICE DAILY 06/21/21  Yes Camara, Amadou, MD  empagliflozin (JARDIANCE) 25 MG TABS tablet Take 25 mg by mouth daily.   Yes [provider]  gabapentin (NEURONTIN) 100 MG capsule Take 100 mg by mouth at bedtime. 01/21/21  Yes [provider]  insulin degludec (TRESIBA FLEXTOUCH) 100 UNIT/ML FlexTouch Pen Inject 20 Units into the skin at bedtime. 05/19/21  Yes [provider]  lisinopril-hydrochlorothiazide (ZESTORETIC) 10-12.5 MG tablet Take 1 tablet by mouth every evening. 01/19/21  Yes [provider]  Semaglutide,0.25 or 0.5MG /DOS, (OZEMPIC, 0.25 OR 0.5 MG/DOSE,) 2 MG/1.5ML SOPN Inject 0.5 mg as directed every Sunday.   Yes [provider]  triamcinolone cream (KENALOG) 0.1 % Apply 1 application  topically 2 (two) times daily as needed (eczema). 12/31/20  Yes [provider]     Family History  Problem Relation Age of Onset   Heart attack Mother    Atrial fibrillation Mother    Heart disease Father    Stroke Maternal Grandfather     Social History   Socioeconomic History   Marital status: Married    Spouse name: Not on file   Number of children: Not on file   Years of education: Not on file   Highest education level: Not on file  Occupational History   Not on file  Tobacco Use   Smoking status: Never   Smokeless tobacco: Never  Vaping Use   Vaping Use: Not on file  Substance and Sexual Activity   Alcohol use: Not on file   Drug use: Not on file   Sexual activity: Not on file  Other Topics Concern   Not on file  Social History Narrative   Not on file   Social Determinants of Health   Financial Resource Strain: Not on file  Food Insecurity: Not on file  Transportation Needs:  Not on file  Physical Activity: Not on file  Stress: Not on file  Social Connections: Not on file     Review of Systems: A 12 point ROS discussed and pertinent positives are indicated in the HPI above.  All other systems are negative.  Review of Systems  Vital Signs: BP 98/72   Pulse 83   Temp 97.8 F (36.6 C) (Temporal)   Resp 18   Ht 5\' 3"  (1.6 m)   Wt 165 lb (74.8 kg)   LMP 03/07/2012   SpO2 98%   BMI 29.23 kg/m   Physical Exam Vitals reviewed.  Constitutional:      Appearance: Normal appearance.  HENT:     Head: Normocephalic and atraumatic.  Eyes:     Extraocular Movements: Extraocular movements intact.  Cardiovascular:     Rate and Rhythm: Normal rate and regular rhythm.  Pulmonary:     Effort: Pulmonary effort is normal. No respiratory distress.     Breath sounds: Normal breath sounds.  Abdominal:     Palpations: Abdomen is soft.  Musculoskeletal:        General: Normal range of motion.     Cervical back: Normal range of motion.  Skin:    General: Skin is warm and dry.  Neurological:     General: No focal deficit present.     Mental Status: She is alert and oriented to person, place, and time.  Psychiatric:        Mood and Affect: Mood normal.        Behavior: Behavior normal.        Thought Content: Thought content normal.        Judgment: Judgment normal.     Imaging: VAS 05/05/2012 LOWER EXTREMITY VENOUS (DVT)  Result Date: 07/07/2021  Lower Venous DVT Study Patient Name:  [name]  Date of Exam:   07/07/2021 Medical Rec #: [id number]        Accession #:    [id number] Date of Birth: 04-06-1970       Patient Gender: F Patient Age:   14 years Exam Location:  [facility] Procedure:      [facility] LOWER EXTREMITY VENOUS (DVT) Referring Phys: [place] --------------------------------------------------------------------------------  Indications: Follow up DVT.  Risk Factors: DVT. Anticoagulation: Eliquis. Comparison Study: 01/24/2021 - RIGHT:                    - Findings consistent with acute deep vein thrombosis                   involving the right                   popliteal vein, right soleal veins, and right gastrocnemius                   veins.                   -  No cystic structure found in the popliteal fossa.                    LEFT:                   - Findings consistent with acute deep vein thrombosis                   involving the left                   gastrocnemius veins.                   - No cystic structure found in the popliteal fossa. Performing Technologist: Oliver Hum RVT  Examination Guidelines: A complete evaluation includes B-mode imaging, spectral Doppler, color Doppler, and power Doppler as needed of all accessible portions of each vessel. Bilateral testing is considered an integral part of a complete examination. Limited examinations for reoccurring indications may be performed as noted. The reflux portion of the exam is performed with the patient in reverse Trendelenburg.  +---------+---------------+---------+-----------+----------+--------------+ RIGHT    CompressibilityPhasicitySpontaneityPropertiesThrombus Aging +---------+---------------+---------+-----------+----------+--------------+ CFV      Full           Yes      Yes                                 +---------+---------------+---------+-----------+----------+--------------+ SFJ      Full                                                        +---------+---------------+---------+-----------+----------+--------------+ FV Prox  Full                                                        +---------+---------------+---------+-----------+----------+--------------+ FV Mid   Full                                                        +---------+---------------+---------+-----------+----------+--------------+ FV DistalFull                                                         +---------+---------------+---------+-----------+----------+--------------+ PFV      Full                                                        +---------+---------------+---------+-----------+----------+--------------+ POP      Full           Yes      Yes                                 +---------+---------------+---------+-----------+----------+--------------+  PTV      Full                                                        +---------+---------------+---------+-----------+----------+--------------+ PERO     Full                                                        +---------+---------------+---------+-----------+----------+--------------+ Soleal   Full                                                        +---------+---------------+---------+-----------+----------+--------------+ Gastroc  Full                                                        +---------+---------------+---------+-----------+----------+--------------+   +---------+---------------+---------+-----------+----------+--------------+ LEFT     CompressibilityPhasicitySpontaneityPropertiesThrombus Aging +---------+---------------+---------+-----------+----------+--------------+ CFV      Full           Yes      Yes                                 +---------+---------------+---------+-----------+----------+--------------+ SFJ      Full                                                        +---------+---------------+---------+-----------+----------+--------------+ FV Prox  Full                                                        +---------+---------------+---------+-----------+----------+--------------+ FV Mid   Full                                                        +---------+---------------+---------+-----------+----------+--------------+ FV DistalFull                                                         +---------+---------------+---------+-----------+----------+--------------+ PFV      Full                                                        +---------+---------------+---------+-----------+----------+--------------+  POP      Full           Yes      Yes                                 +---------+---------------+---------+-----------+----------+--------------+ PTV      Full                                                        +---------+---------------+---------+-----------+----------+--------------+ PERO     Full                                                        +---------+---------------+---------+-----------+----------+--------------+ Gastroc  Full                                                        +---------+---------------+---------+-----------+----------+--------------+     Summary: RIGHT: - There is no evidence of deep vein thrombosis in the lower extremity.  - No cystic structure found in the popliteal fossa.  LEFT: - There is no evidence of deep vein thrombosis in the lower extremity.  - No cystic structure found in the popliteal fossa.  *See table(s) above for measurements and observations. Electronically signed by Harold Barban MD on 07/07/2021 at 10:50:12 PM.    Final     Labs:  CBC: Recent Labs    01/28/21 0312 01/29/21 0227 02/16/21 1228 06/30/21 1003  WBC 10.5 12.5* 9.9 7.4  HGB 12.5 13.0 12.4 14.1  HCT 37.0 36.8 36.9 42.2  PLT 179 184 349 270    COAGS: Recent Labs    01/23/21 1501  INR 1.0  APTT 24    BMP: Recent Labs    01/27/21 0340 01/28/21 0312 01/29/21 0227 06/30/21 1003  NA 136 133* 134* 138  K 3.3* 4.5 3.6 4.1  CL 101 101 99 102  CO2 23 21* 23 31  GLUCOSE 153* 118* 107* 137*  BUN 10 12 13 15   CALCIUM 9.3 9.3 9.5 10.6*  CREATININE 0.57 0.59 0.66 0.73  GFRNONAA >60 >60 >60 >60    LIVER FUNCTION TESTS: Recent Labs    01/23/21 1501 06/30/21 1003  BILITOT 0.8 0.8  AST 25 14*  ALT 26 16  ALKPHOS 69 59   PROT 6.9 7.9  ALBUMIN 3.9 4.5    TUMOR MARKERS: No results for input(s): "AFPTM", "CEA", "CA199", "CHROMGRNA" in the last 8760 hours.  Assessment and Plan:  History of DVT and intracranial hemorrhage. Now back on anticoagulation. Will proceed with retrieval of IVC Filter today by Dr. Maryelizabeth Kaufmann.  Risks and benefits of IVC filter retrieval were discussed with the patient including, but not limited to bleeding, infection, contrast induced renal failure, filter fracture or migration which can lead to emergency surgery or even death.  All of the patient's questions were answered, patient is agreeable to proceed. Consent signed and in chart.   Thank you for allowing our service to participate in  [name] 's care.  Electronically Signed: Murrell Redden, PA-C   08/04/2021, 8:08 AM      I spent a total of    15 Minutes in face to face in clinical consultation, greater than 50% of which was counseling/coordinating care for IVC filter removal.

## 2021-08-04 NOTE — Procedures (Addendum)
Vascular and Interventional Radiology Procedure Note  Patient: CARISA BACKHAUS DOB: Jun 03, 1970 Medical Record Number: [id number] Note Date/Time: 08/04/21 8:36 AM   Performing Physician: Roanna Banning, MD Assistant(s): None  Diagnosis: hx of DVT. Tolerating AC  Procedure:  CENTRAL VENOGRAM INFERIOR VENA CAVA FILTER RETRIEVAL    Anesthesia: Conscious Sedation Complications: None Estimated Blood Loss: Minimal Specimens: None  Findings:  - access via the Right  IJ  vein. - Central venogram without evidence of thrombus. - successful retrieval of infrarenal IVC filter. -  - No obvious abnormality was identified at this time.  Final report to follow once all images are reviewed and compared with previous studies.  See detailed dictation with images in PACS. The patient tolerated the procedure well without incident or complication and was returned to Recovery in stable condition.    Roanna Banning, MD [facility] Radiology   Pager. [phone] Clinic. [phone]

## 2021-08-17 DIAGNOSIS — I639 Cerebral infarction, unspecified: Secondary | ICD-10-CM | POA: Diagnosis not present

## 2021-08-17 DIAGNOSIS — E1165 Type 2 diabetes mellitus with hyperglycemia: Secondary | ICD-10-CM | POA: Diagnosis not present

## 2021-08-17 DIAGNOSIS — E7800 Pure hypercholesterolemia, unspecified: Secondary | ICD-10-CM | POA: Diagnosis not present

## 2021-08-17 DIAGNOSIS — I10 Essential (primary) hypertension: Secondary | ICD-10-CM | POA: Diagnosis not present

## 2021-08-23 ENCOUNTER — Encounter: Payer: Self-pay | Admitting: Neurology

## 2021-08-23 MED ORDER — APIXABAN 5 MG PO TABS: ORAL_TABLET | ORAL | 5 refills | Status: AC

## 2021-08-23 NOTE — Telephone Encounter (Signed)
Note from 07/14/21: Continue Eliquis (apixaban) due 2 history of DVT for secondary stroke prevention and maintain strict control of hypertension with blood pressure goal below 130/90, diabetes with hemoglobin A1c goal below 6.5% and lipids with LDL cholesterol goal below 70 mg/dL.   Pending appt 01/17/22.  Refills sent to pharmacy.

## 2021-10-18 DIAGNOSIS — U071 COVID-19: Secondary | ICD-10-CM | POA: Diagnosis not present

## 2021-11-19 DIAGNOSIS — E7800 Pure hypercholesterolemia, unspecified: Secondary | ICD-10-CM | POA: Diagnosis not present

## 2021-11-19 DIAGNOSIS — I679 Cerebrovascular disease, unspecified: Secondary | ICD-10-CM | POA: Diagnosis not present

## 2021-11-19 DIAGNOSIS — I10 Essential (primary) hypertension: Secondary | ICD-10-CM | POA: Diagnosis not present

## 2021-11-19 DIAGNOSIS — E119 Type 2 diabetes mellitus without complications: Secondary | ICD-10-CM | POA: Diagnosis not present

## 2022-01-12 NOTE — Patient Instructions (Signed)
Below is our plan:  We will continue to monitor, Continue atorvastatin and Eliquis as directed.   Please make sure you are staying well hydrated. I recommend 50-60 ounces daily. Well balanced diet and regular exercise encouraged. Consistent sleep schedule with 6-8 hours recommended.   Please continue follow up with care team as directed.   Follow up with neurology as needed   You may receive a survey regarding today's visit. I encourage you to leave honest feed back as I do use this information to improve patient care. Thank you for seeing me today!

## 2022-01-12 NOTE — Progress Notes (Signed)
Chief Complaint  Patient presents with   Follow-up    Pt in room #1 with her husband. Pt here today to fr/u with her stroke.    HISTORY OF PRESENT ILLNESS:  01/17/22 ALL:  [name] is a 51 y.o. female here today for follow up for CVA. She is doing well. No physical deficits. She does feel she has some brain fog at times. 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 She continues Eliquis and atorvastatin. A1C well managed with Dr Allena Katz. CBGs and BP normal. 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 PFO closure not advised by Dr Excell Seltzer if she continued life long anticoags.    HISTORY (copied from Dr Marlis Edelson previous note)  HPI: Initial visit 02/24/2021: [name] is a 51 year old Caucasian lady seen today for initial office consultation visit.  History is obtained from the patient and her husband as well as review of electronic medical records and opossum reviewed available pertinent imaging films in PACS.  She is a 51 year old Caucasian lady with past medical history of uncontrolled diabetes, hypertension and hyperlipidemia who presented on 01/23/2021 with sudden onset of aphasia, left gaze preference and right hemiplegia with NIH stroke scale of 18.  She was given IV TNK and CT angiogram showed left M1 thrombus and she was taken for emergent thrombectomy and underwent successful TICI 3 revascularization of inferior division of the left MCA.  She was admitted to the ICU with follow-up CT scan showed hemorrhagic transformation of the left insula and frontal region.  2D echo showed normal ejection fraction.  TEE showed a small PFO but no other cardiac source of embolism.  LDL cholesterol was 73 mg percent.  Hemoglobin A1c was elevated at 11.1.  Lower extremity venous Doppler showed acute DVT involving popliteal, soleal and gastrocnemius veins.  Patient could not be anticoagulated due to intracerebral hemorrhage and underwent an  IVC filter placement.  Initial hypercoagulable panel labs were negative but subsequently she saw hematologist Dr. Candise Che who ordered factor VIII levels which was found to be elevated at 211 percentage units but it is unclear if this is acute phase reactant.  There is a family history of DVTs in her maternal aunt as well as her grandfather died of stroke in his 36s.  Patient had no history of recent prolonged travel via airline or road or any prior history of DVTs or pulmonary embolism.  She was not started on any antiplatelet or anticoagulants due to hemorrhagic transformation of her stroke.  Patient actually has done quite well and her aphasia has resolved.  She is finished outpatient speech therapy.  Her physical strength has also improved back to baseline.  Her stamina has not she gets tired easily.  She is currently not working and wants paperwork to be done for [facility] [employer] [employer] but she feels she is ready to return to work part-time for a few weeks.  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  She has no prior history of strokes, TIAs, migraines seizures or any other neurological problems.  She was seen by Dr. Excell Seltzer earlier this week who has deferred decision about endovascular PFO closure pending final decision whether she needs long-term anticoagulation or not.  Update 07/14/2021 : She returns for follow-up after last visit 4 and half months ago.  She states she is doing well.  Speech is improved pretty well.  She has only occasional minor headaches as well as hiccups but  these are transient and not bothersome.  She remains on Eliquis for her DVT.  She recently saw her hematologist Dr. Candise Che who recommended she stay on long-term anticoagulation.  She did undergo TCD bubble study on 03/04/2021 which was positive suggestive of a small right to left shunt.  She saw Dr. Excell Seltzer who recommended no PFO closure as long as she is on Eliquis plus she has a very low ROPE score of 5.  She had repeat Factor VIII levels checked  which were normal normal indicating the previously elevated levels of acute phase reactant.  She is scheduled to undergo IVC filter removal soon.  She had repeat lower extremity venous Dopplers done on 07/07/2021 which showed complete resolution of the DVT and no clot was seen.   REVIEW OF SYSTEMS: Out of a complete 14 system review of symptoms, the patient complains only of the following symptoms, cognitive deficits and all other reviewed systems are negative.   ALLERGIES: No Known Allergies   HOME MEDICATIONS: Outpatient Medications Prior to Visit  Medication Sig Dispense Refill   apixaban (ELIQUIS) 5 MG TABS tablet TAKE 1 TABLET(5 MG) BY MOUTH TWICE DAILY 60 tablet 5   atorvastatin (LIPITOR) 40 MG tablet Take 1 tablet (40 mg total) by mouth daily. (Patient taking differently: Take 40 mg by mouth at bedtime.) 30 tablet 0   empagliflozin (JARDIANCE) 25 MG TABS tablet Take 25 mg by mouth daily.     Semaglutide,0.25 or 0.5MG /DOS, (OZEMPIC, 0.25 OR 0.5 MG/DOSE,) 2 MG/1.5ML SOPN Inject 0.5 mg as directed every Sunday.     Silver (BASADROX EX) Apply 20 Units topically 1 day or 1 dose.     triamcinolone cream (KENALOG) 0.1 % Apply 1 application  topically 2 (two) times daily as needed (eczema).     Cholecalciferol (VITAMIN D) 50 MCG (2000 UT) tablet Take 2,000 Units by mouth daily.     gabapentin (NEURONTIN) 100 MG capsule Take 100 mg by mouth at bedtime.     insulin degludec (TRESIBA FLEXTOUCH) 100 UNIT/ML FlexTouch Pen Inject 20 Units into the skin at bedtime.     lisinopril-hydrochlorothiazide (ZESTORETIC) 10-12.5 MG tablet Take 1 tablet by mouth every evening.     No facility-administered medications prior to visit.     PAST MEDICAL HISTORY: Past Medical History:  Diagnosis Date   Hyperlipidemia    Hypertension    Stroke [facility])      PAST SURGICAL HISTORY: Past Surgical History:  Procedure Laterality Date   BUBBLE STUDY  01/28/2021   Procedure: BUBBLE STUDY;  Surgeon: Chrystie Nose, MD;  Location: [facility] ENDOSCOPY;  Service: Cardiovascular;;   IR CT HEAD LTD  01/23/2021   IR IVC FILTER PLMT / S&I Lenise Arena GUID/MOD SED  01/28/2021   IR IVC FILTER RETRIEVAL / S&I Lenise Arena GUID/MOD SED  08/04/2021   IR PERCUTANEOUS ART THROMBECTOMY/INFUSION INTRACRANIAL INC DIAG ANGIO  01/23/2021   RADIOLOGY WITH ANESTHESIA N/A 01/23/2021   Procedure: IR WITH ANESTHESIA;  Surgeon: Radiologist, Medication, MD;  Location: [facility] OR;  Service: Radiology;  Laterality: N/A;   TEE WITHOUT CARDIOVERSION N/A 01/28/2021   Procedure: TRANSESOPHAGEAL ECHOCARDIOGRAM (TEE);  Surgeon: Chrystie Nose, MD;  Location: [facility] ENDOSCOPY;  Service: Cardiovascular;  Laterality: N/A;     FAMILY HISTORY: Family History  Problem Relation Age of Onset   Heart attack Mother    Atrial fibrillation Mother    Heart disease Father    Stroke Maternal Grandfather      SOCIAL HISTORY: Social History   Socioeconomic  History   Marital status: Married    Spouse name: Not on file   Number of children: Not on file   Years of education: Not on file   Highest education level: Not on file  Occupational History   Not on file  Tobacco Use   Smoking status: Never   Smokeless tobacco: Never  Vaping Use   Vaping Use: Not on file  Substance and Sexual Activity   Alcohol use: Not on file   Drug use: Not on file   Sexual activity: Not on file  Other Topics Concern   Not on file  Social History Narrative   Not on file   Social Determinants of Health   Financial Resource Strain: Not on file  Food Insecurity: Not on file  Transportation Needs: Not on file  Physical Activity: Not on file  Stress: Not on file  Social Connections: Not on file  Intimate Partner Violence: Not on file     PHYSICAL EXAM  Vitals:   01/17/22 0907  BP: 112/70  Pulse: 69  Weight: 143 lb 8 oz (65.1 kg)  Height: 5\' 3"  (1.6 m)   Body mass index is 25.42 kg/m.  Generalized: Well developed, in no acute distress  Cardiology: normal  rate and rhythm, no murmur auscultated  Respiratory: clear to auscultation bilaterally    Neurological examination  Mentation: Alert oriented to time, place, history taking. Follows all commands speech and language fluent Cranial nerve II-XII: Pupils were equal round reactive to light. Extraocular movements were full, visual field were full on confrontational test. Facial sensation and strength were normal. Head turning and shoulder shrug  were normal and symmetric. Motor: The motor testing reveals 5 over 5 strength of all 4 extremities. Good symmetric motor tone is noted throughout.  Gait and station: Gait is normal.    DIAGNOSTIC DATA (LABS, IMAGING, TESTING) - I reviewed patient records, labs, notes, testing and imaging myself where available.  Lab Results  Component Value Date   WBC 7.4 06/30/2021   HGB 14.1 06/30/2021   HCT 42.2 06/30/2021   MCV 91.7 06/30/2021   PLT 270 06/30/2021      Component Value Date/Time   NA 138 06/30/2021 1003   K 4.1 06/30/2021 1003   CL 102 06/30/2021 1003   CO2 31 06/30/2021 1003   GLUCOSE 137 (H) 06/30/2021 1003   BUN 15 06/30/2021 1003   CREATININE 0.73 06/30/2021 1003   CALCIUM 10.6 (H) 06/30/2021 1003   PROT 7.9 06/30/2021 1003   ALBUMIN 4.5 06/30/2021 1003   AST 14 (L) 06/30/2021 1003   ALT 16 06/30/2021 1003   ALKPHOS 59 06/30/2021 1003   BILITOT 0.8 06/30/2021 1003   GFRNONAA >60 06/30/2021 1003   Lab Results  Component Value Date   CHOL 129 01/24/2021   HDL 33 (L) 01/24/2021   LDLCALC 73 01/24/2021   LDLDIRECT 154.0 12/28/2009   TRIG 114 01/24/2021   TRIG 112 01/24/2021   CHOLHDL 3.9 01/24/2021   Lab Results  Component Value Date   HGBA1C 11.1 (H) 01/24/2021   Lab Results  Component Value Date   VITAMINB12 326 06/30/2021   Lab Results  Component Value Date   TSH 0.354 01/24/2021        No data to display               No data to display           ASSESSMENT AND PLAN  51 y.o. year old female  has  a past medical history of Hyperlipidemia, Hypertension, and Stroke (HCC). here with    H/O: stroke  PFO (patent foramen ovale)  Deep vein thrombosis (DVT) of lower extremity, unspecified chronicity, unspecified laterality, unspecified vein (HCC)  [name] is doing well. She is followed regularly by PCP and endocrinology. She has been released from hematology care and advised to continue long term Eliquis. Dr Excell Seltzerooper does not advise PFO closure if on long term anticoag. She will continue atorvastatin and Eliquis as directed. We have discussed BP and CBG management. Regular physical and mental exercise encouraged. Healthy lifestyle habits encouraged. Signs and symptoms of stroke reviewed. Educational materials provided. She will follow up with PCP as directed. She will return to see me as needed. She verbalizes understanding and agreement with this plan.   No orders of the defined types were placed in this encounter.    No orders of the defined types were placed in this encounter.    Shawnie DapperAmy Martine Bleecker, MSN, FNP-C 01/17/2022, 9:54 AM  [facility] [address], [address] [place], [place] [address] [phone]

## 2022-01-17 ENCOUNTER — Encounter: Payer: Self-pay | Admitting: Family Medicine

## 2022-01-17 ENCOUNTER — Ambulatory Visit: Payer: Federal, State, Local not specified - PPO | Admitting: Family Medicine

## 2022-01-17 VITALS — BP 112/70 | HR 69 | Ht 63.0 in | Wt 143.5 lb

## 2022-01-17 DIAGNOSIS — I82409 Acute embolism and thrombosis of unspecified deep veins of unspecified lower extremity: Secondary | ICD-10-CM | POA: Diagnosis not present

## 2022-01-17 DIAGNOSIS — Q2112 Patent foramen ovale: Secondary | ICD-10-CM | POA: Diagnosis not present

## 2022-01-17 DIAGNOSIS — Z8673 Personal history of transient ischemic attack (TIA), and cerebral infarction without residual deficits: Secondary | ICD-10-CM

## 2022-02-17 DIAGNOSIS — E559 Vitamin D deficiency, unspecified: Secondary | ICD-10-CM | POA: Diagnosis not present

## 2022-02-17 DIAGNOSIS — I639 Cerebral infarction, unspecified: Secondary | ICD-10-CM | POA: Diagnosis not present

## 2022-02-17 DIAGNOSIS — E1165 Type 2 diabetes mellitus with hyperglycemia: Secondary | ICD-10-CM | POA: Diagnosis not present

## 2022-02-17 DIAGNOSIS — I10 Essential (primary) hypertension: Secondary | ICD-10-CM | POA: Diagnosis not present

## 2022-02-17 DIAGNOSIS — E7800 Pure hypercholesterolemia, unspecified: Secondary | ICD-10-CM | POA: Diagnosis not present

## 2022-04-26 DIAGNOSIS — I10 Essential (primary) hypertension: Secondary | ICD-10-CM | POA: Diagnosis not present

## 2022-04-26 DIAGNOSIS — E119 Type 2 diabetes mellitus without complications: Secondary | ICD-10-CM | POA: Diagnosis not present

## 2022-04-26 DIAGNOSIS — E7800 Pure hypercholesterolemia, unspecified: Secondary | ICD-10-CM | POA: Diagnosis not present

## 2022-04-26 DIAGNOSIS — Z0000 Encounter for general adult medical examination without abnormal findings: Secondary | ICD-10-CM | POA: Diagnosis not present

## 2022-05-11 DIAGNOSIS — Z1231 Encounter for screening mammogram for malignant neoplasm of breast: Secondary | ICD-10-CM | POA: Diagnosis not present

## 2022-05-11 DIAGNOSIS — Z124 Encounter for screening for malignant neoplasm of cervix: Secondary | ICD-10-CM | POA: Diagnosis not present

## 2022-05-11 DIAGNOSIS — Z01419 Encounter for gynecological examination (general) (routine) without abnormal findings: Secondary | ICD-10-CM | POA: Diagnosis not present

## 2022-05-16 ENCOUNTER — Other Ambulatory Visit: Payer: Self-pay | Admitting: Obstetrics and Gynecology

## 2022-05-16 DIAGNOSIS — R928 Other abnormal and inconclusive findings on diagnostic imaging of breast: Secondary | ICD-10-CM

## 2022-05-26 ENCOUNTER — Other Ambulatory Visit: Payer: Federal, State, Local not specified - PPO

## 2022-05-30 ENCOUNTER — Ambulatory Visit
Admission: RE | Admit: 2022-05-30 | Discharge: 2022-05-30 | Disposition: A | Discharge status: Home / Self Care | Payer: Federal, State, Local not specified - PPO | Source: Ambulatory Visit | Attending: Obstetrics and Gynecology | Admitting: Obstetrics and Gynecology

## 2022-05-30 ENCOUNTER — Ambulatory Visit: Payer: Federal, State, Local not specified - PPO

## 2022-05-30 DIAGNOSIS — R928 Other abnormal and inconclusive findings on diagnostic imaging of breast: Secondary | ICD-10-CM

## 2022-05-30 DIAGNOSIS — Z1231 Encounter for screening mammogram for malignant neoplasm of breast: Secondary | ICD-10-CM | POA: Diagnosis not present

## 2022-05-30 DIAGNOSIS — R92322 Mammographic fibroglandular density, left breast: Secondary | ICD-10-CM | POA: Diagnosis not present

## 2022-08-18 DIAGNOSIS — I10 Essential (primary) hypertension: Secondary | ICD-10-CM | POA: Diagnosis not present

## 2022-08-18 DIAGNOSIS — E7800 Pure hypercholesterolemia, unspecified: Secondary | ICD-10-CM | POA: Diagnosis not present

## 2022-08-18 DIAGNOSIS — I639 Cerebral infarction, unspecified: Secondary | ICD-10-CM | POA: Diagnosis not present

## 2022-08-18 DIAGNOSIS — E559 Vitamin D deficiency, unspecified: Secondary | ICD-10-CM | POA: Diagnosis not present

## 2022-08-18 DIAGNOSIS — E1165 Type 2 diabetes mellitus with hyperglycemia: Secondary | ICD-10-CM | POA: Diagnosis not present

## 2022-10-27 DIAGNOSIS — I10 Essential (primary) hypertension: Secondary | ICD-10-CM | POA: Diagnosis not present

## 2022-10-27 DIAGNOSIS — E7800 Pure hypercholesterolemia, unspecified: Secondary | ICD-10-CM | POA: Diagnosis not present

## 2022-10-27 DIAGNOSIS — I679 Cerebrovascular disease, unspecified: Secondary | ICD-10-CM | POA: Diagnosis not present

## 2022-10-27 DIAGNOSIS — E119 Type 2 diabetes mellitus without complications: Secondary | ICD-10-CM | POA: Diagnosis not present

## 2022-10-27 DIAGNOSIS — Z23 Encounter for immunization: Secondary | ICD-10-CM | POA: Diagnosis not present

## 2023-05-05 DIAGNOSIS — E7800 Pure hypercholesterolemia, unspecified: Secondary | ICD-10-CM | POA: Diagnosis not present

## 2023-05-05 DIAGNOSIS — E119 Type 2 diabetes mellitus without complications: Secondary | ICD-10-CM | POA: Diagnosis not present

## 2023-05-05 DIAGNOSIS — Z0000 Encounter for general adult medical examination without abnormal findings: Secondary | ICD-10-CM | POA: Diagnosis not present

## 2023-05-05 DIAGNOSIS — I679 Cerebrovascular disease, unspecified: Secondary | ICD-10-CM | POA: Diagnosis not present

## 2023-05-05 DIAGNOSIS — I10 Essential (primary) hypertension: Secondary | ICD-10-CM | POA: Diagnosis not present

## 2023-05-05 DIAGNOSIS — E559 Vitamin D deficiency, unspecified: Secondary | ICD-10-CM | POA: Diagnosis not present

## 2023-05-13 IMAGING — XA IR IVC FILTER PLMT / S&I /IMG GUID/MOD SED
3 series · 9 of 9 positions shown · non-contrast
Comparison: none

INDICATION: Intracranial hemorrhage.  Unable to anticoagulate.

[Series 1: fl (-) angio · 1 of 1 slices shown (1 of 2)]
[im 1/1]
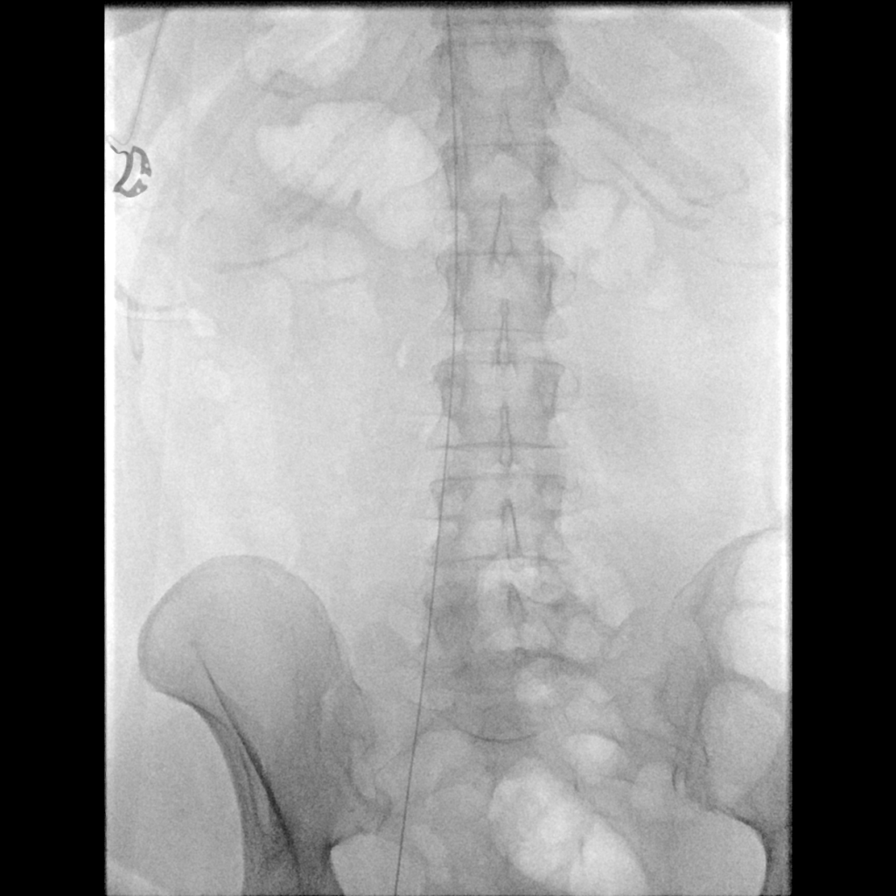

[Series 2: body 4 care · 4 of 23 frames shown]
[frame 4/23]
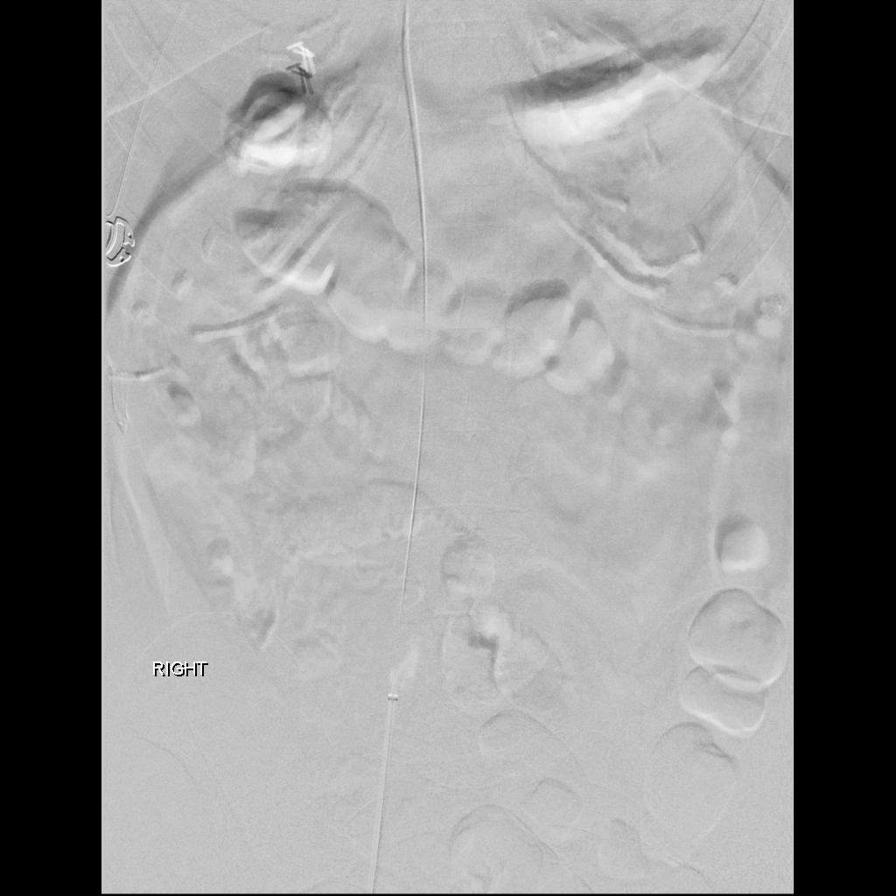
[frame 12/23]
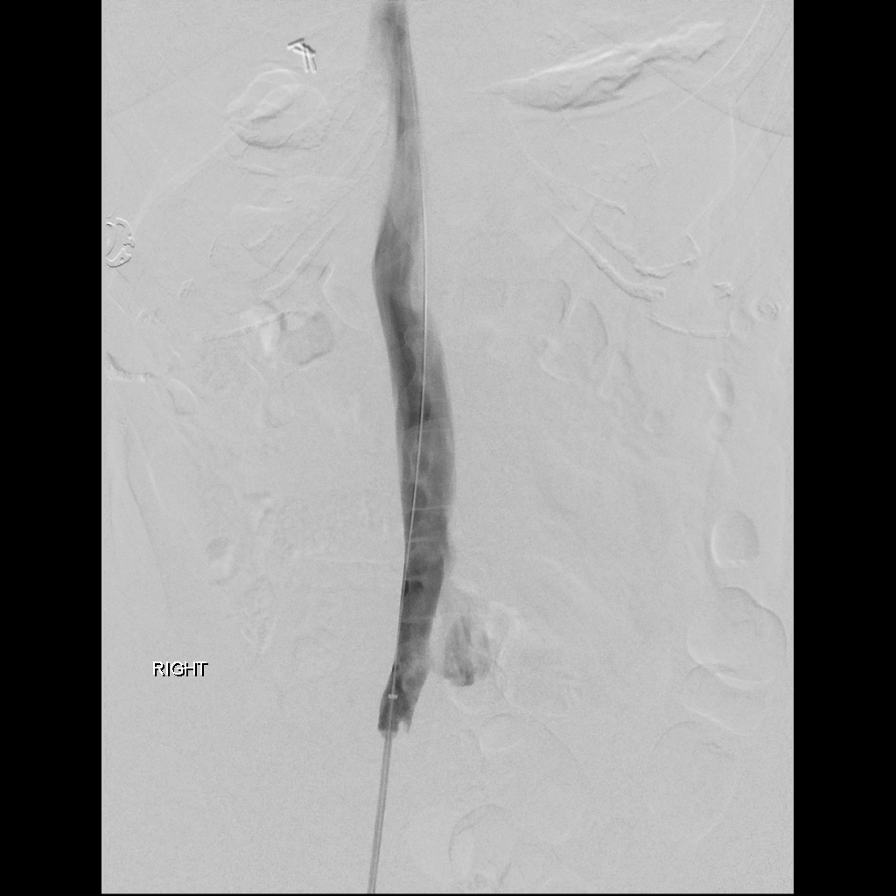
[frame 20/23]
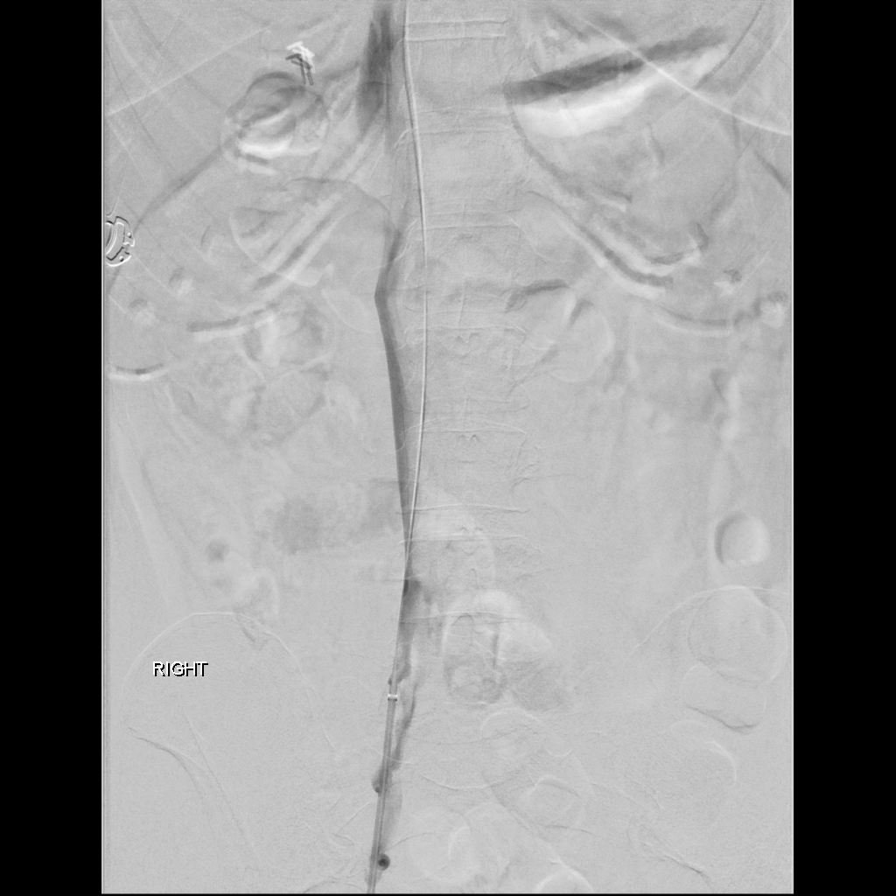
[frame 21/23]
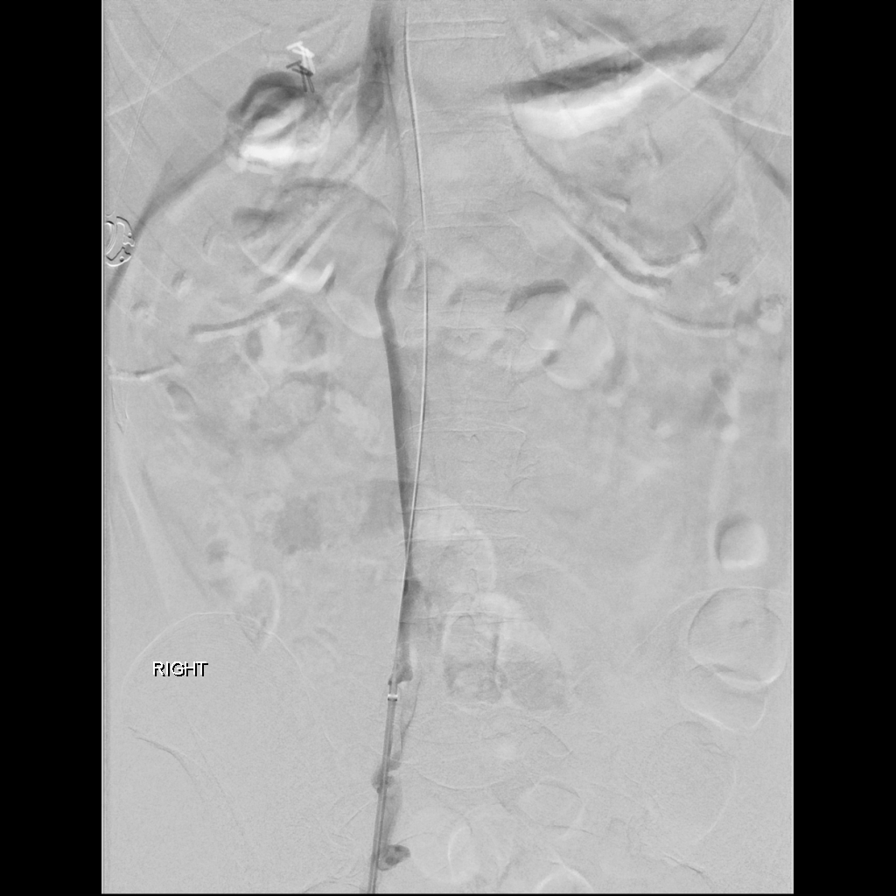

[Series 3: fl (-) angio · 4 of 12 frames shown (2 of 2)]
[frame 2/12]
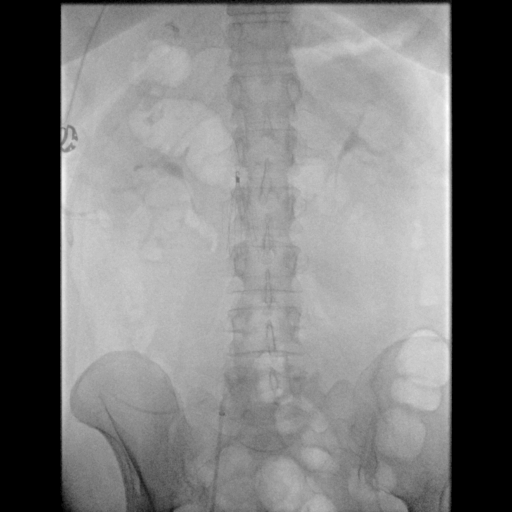
[frame 6/12]
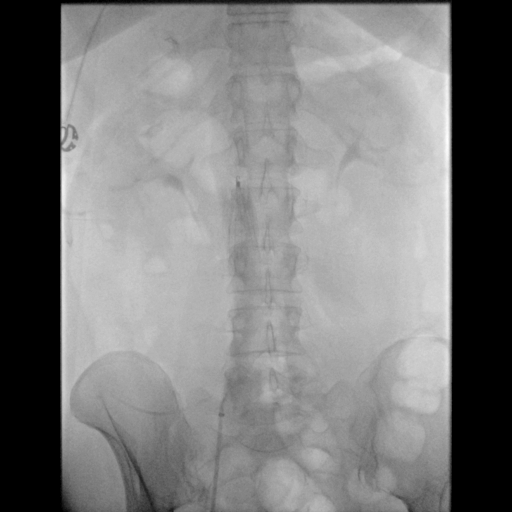
[frame 7/12]
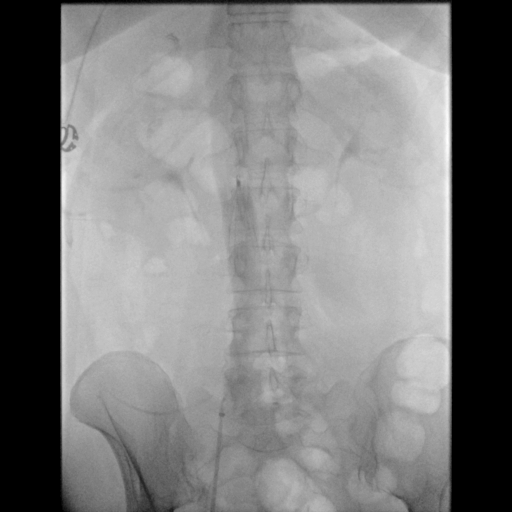
[frame 11/12]
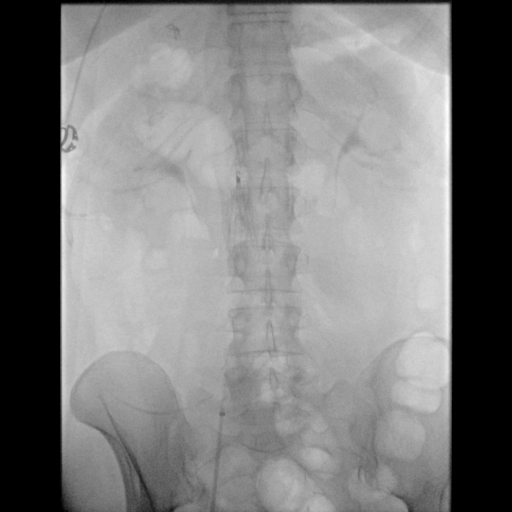

[9 of 9 positions shown; findings below may reference images not displayed]

EXAM:
1. ULTRASOUND GUIDANCE FOR VASCULAR ACCESS
2. IVC CATHETERIZATION AND VENOGRAM
3. IVC FILTER INSERTION

MEDICATIONS:
None.

ANESTHESIA/SEDATION:
Fentanyl 50 mcg IV; Versed 2 mg IV

Sedation Time: 65 minutes; The patient was continuously monitored
during the procedure by the interventional radiology nurse under my
direct supervision.

CONTRAST:  50mL OMNIPAQUE IOHEXOL 300 MG/ML  SOLN

FLUOROSCOPY TIME:  1 minutes 0 seconds (11 mGy)

COMPLICATIONS:
None immediate.

PROCEDURE:
Informed written consent was obtained from the the patient and/or
patient's representative following explanation of the procedure,
risks, benefits and alternatives. A time out was performed prior to
the initiation of the procedure. Maximal barrier sterile technique
utilized including caps, mask, sterile gowns, sterile gloves, large
sterile drape, hand hygiene, and Betadine prep.

Under sterile condition and local anesthesia, right common femoral
venous access was performed with ultrasound. An ultrasound image was
saved and sent to PACS. Over a guidewire, the IVC filter delivery
sheath and inner dilator were advanced into the IVC just above the
IVC bifurcation. Contrast injection was performed for an IVC
venogram.

Through the delivery sheath, a retrievable Denali IVC filter was
deployed below the level of the renal veins and above the IVC
bifurcation. Limited post deployment venacavagram was performed.

The delivery sheath was removed and hemostasis was obtained with
manual compression. A dressing was placed. The patient tolerated the
procedure well without immediate post procedural complication.
FINDINGS: 1. The IVC is patent. No evidence of thrombus, stenosis, or
occlusion. No variant venous anatomy.
2. Successful placement of the IVC filter below the level of the
renal veins.
IMPRESSION: Successful placement of a retrievable, infrarenal IVC filter as
above.

PLAN:
IVC filters can cause complications when left in place for extended
periods of time. If medically appropriate, recommend discontinuing
filter prior to discharge.

Please re-evaluate the patient for filter discontinuation when they
are seen in follow up, and refer patient back to Interventional
Radiology for removal.

## 2023-05-14 IMAGING — CT CT HEAD W/O CM
4 series · 16 of 47 positions shown, 18 images · non-contrast
Comparison: Two days ago

CLINICAL DATA: Follow-up hemorrhagic stroke with left-sided
headache right

EXAM:
CT HEAD WITHOUT CONTRAST
TECHNIQUE: Contiguous axial images were obtained from the base of the skull
through the vertex without intravenous contrast.

[Series 3: head wo · axial · 0.47mm/px · z∈[-143,-23]mm · 7 of 34 slices shown, 9 images]
[im 5/34  brain]
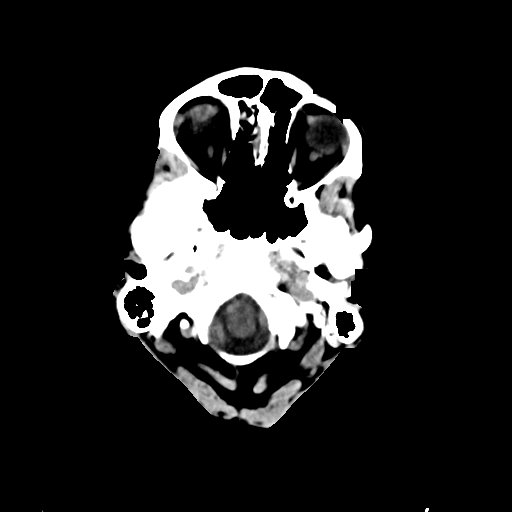
[im 5/34  bone]
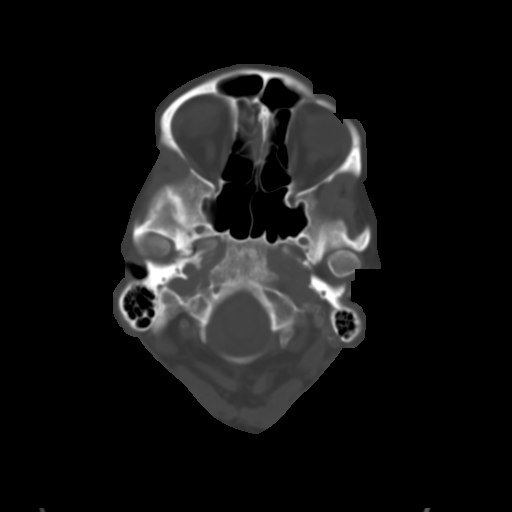
[im 9/34  brain]
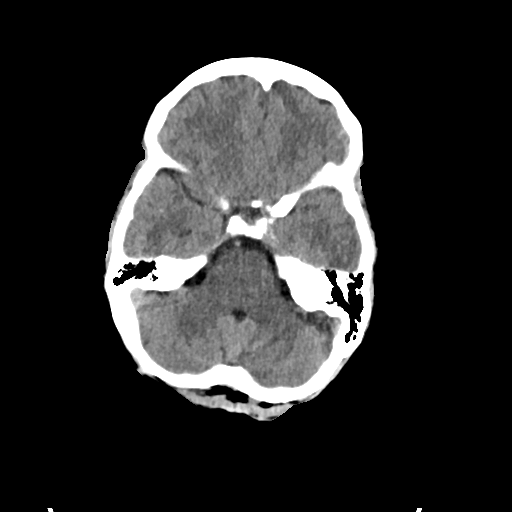
[im 13/34  brain]
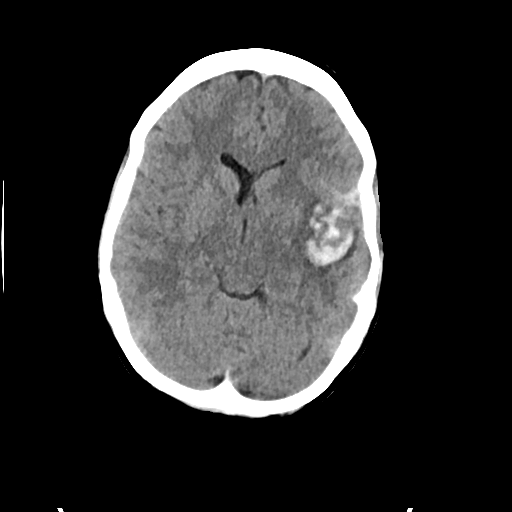
[im 17/34  brain]
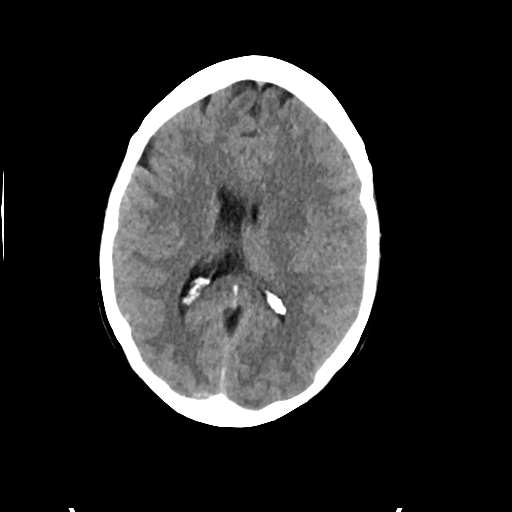
[im 21/34  brain]
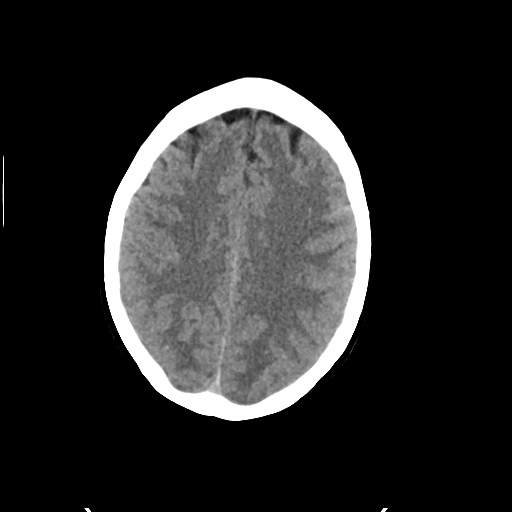
[im 21/34  bone]
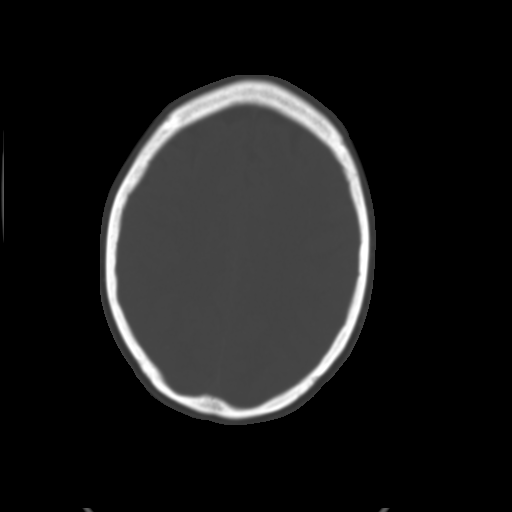
[im 25/34  brain]
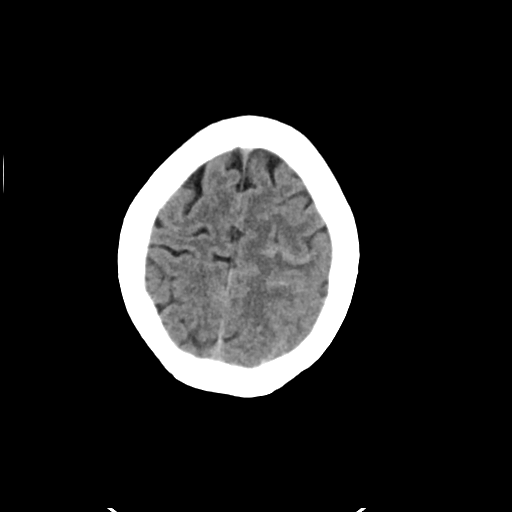
[im 29/34  brain]
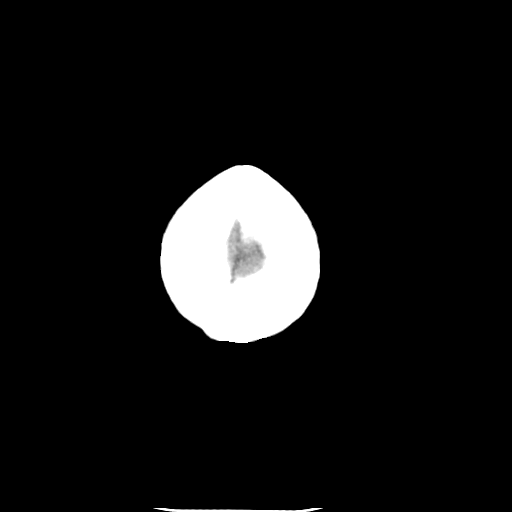

[Series 4: head bone · axial · 0.47mm/px · z∈[-147,-115]mm · 3 of 84 slices shown]
[im 9/84  bone]
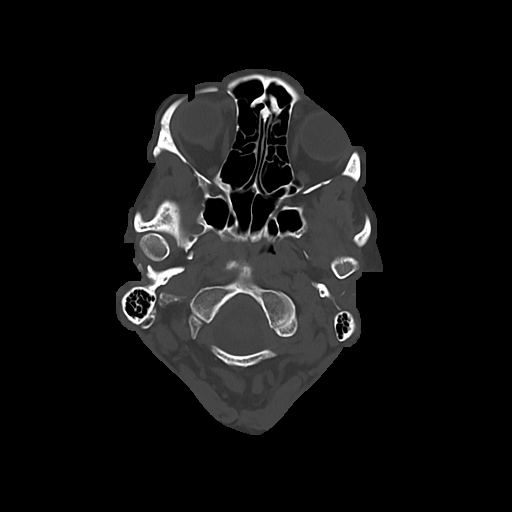
[im 17/84  bone]
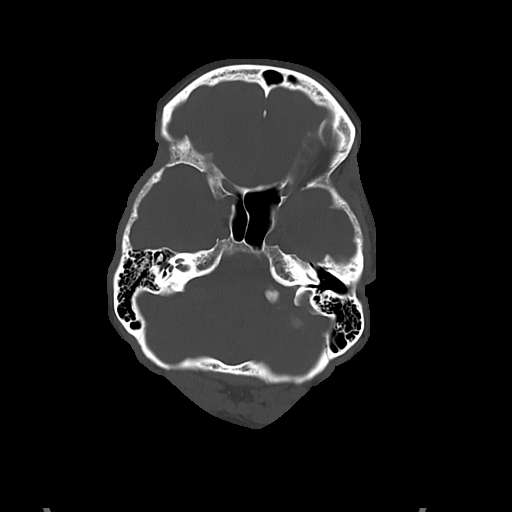
[im 25/84  bone]
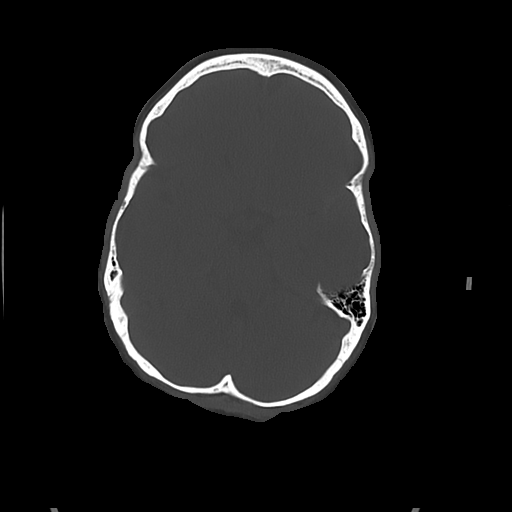

[Series 5: cor soft · coronal · 0.32mm/px · 3 of 74 slices shown]
[im 25/74  brain]
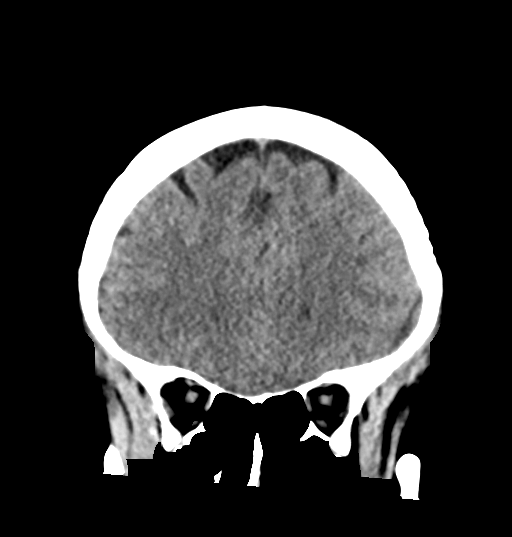
[im 33/74  brain]
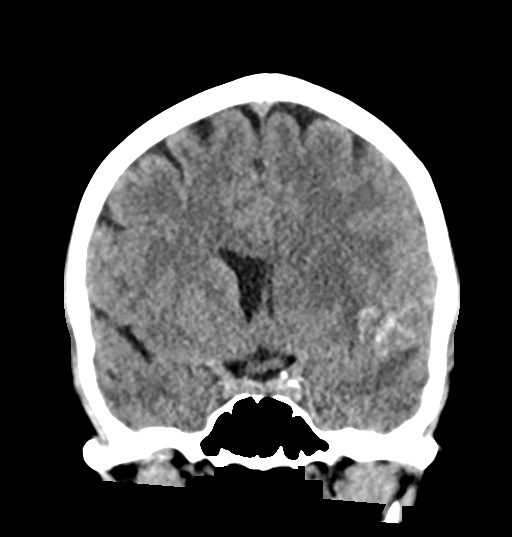
[im 41/74  brain]
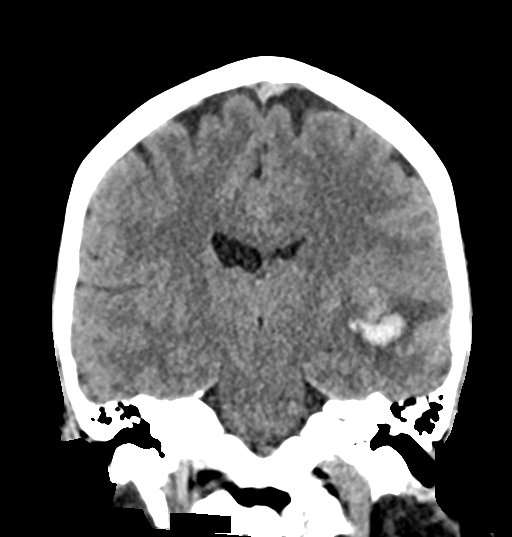

[Series 6: sag soft · sagittal · 0.34mm/px · 3 of 55 slices shown]
[im 19/55  brain]
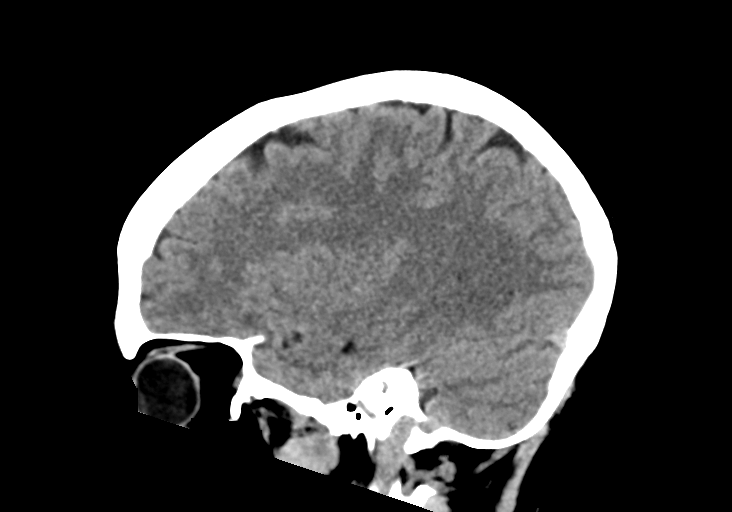
[im 28/55  brain]
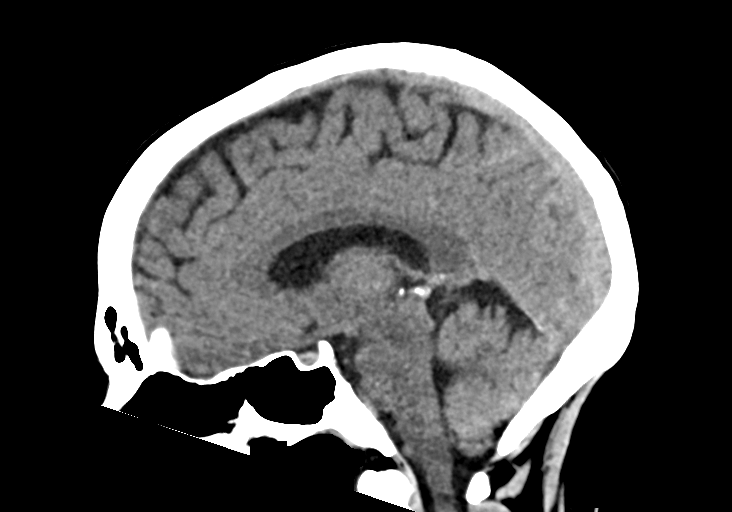
[im 37/55  brain]
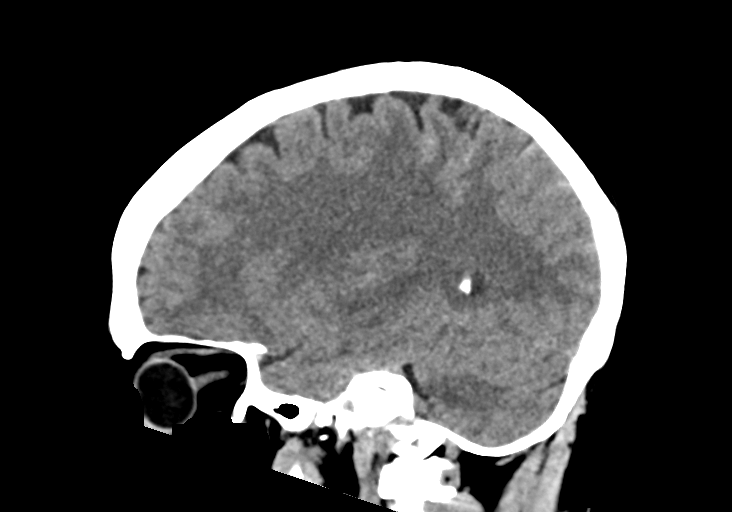

[16 of 47 positions shown; findings below may reference images not displayed]

FINDINGS: Brain: Left hemispheric infarcts with hemorrhage at the level of the
insula and local subarachnoid hemorrhage is also patchy early seen
at the left vertex. No progression of these insult since prior.
There is question of cytotoxic edema newly seen in the left parietal
lobe. No significant midline shift and no hydrocephalus.

Vascular: No hyperdense vessel or unexpected calcification.

Skull: Normal. Negative for fracture or focal lesion.

Sinuses/Orbits: No acute finding.  Bilateral cataract resection.
IMPRESSION: 1. Question acute left parietal infarction since most recent head CT
and MRI, located near mild subarachnoid hemorrhage at the left
vertex.
2. No progression of left insular hemorrhage and adjacent
infarction.

## 2023-06-06 DIAGNOSIS — Z6824 Body mass index (BMI) 24.0-24.9, adult: Secondary | ICD-10-CM | POA: Diagnosis not present

## 2023-06-06 DIAGNOSIS — Z01419 Encounter for gynecological examination (general) (routine) without abnormal findings: Secondary | ICD-10-CM | POA: Diagnosis not present

## 2023-06-06 DIAGNOSIS — Z1231 Encounter for screening mammogram for malignant neoplasm of breast: Secondary | ICD-10-CM | POA: Diagnosis not present

## 2023-06-20 ENCOUNTER — Other Ambulatory Visit

## 2023-06-20 ENCOUNTER — Other Ambulatory Visit: Payer: Self-pay | Admitting: Internal Medicine

## 2023-06-20 DIAGNOSIS — N3000 Acute cystitis without hematuria: Secondary | ICD-10-CM

## 2023-06-20 MED ORDER — SULFAMETHOXAZOLE-TRIMETHOPRIM 800-160 MG PO TABS: 1.0000 | ORAL_TABLET | Freq: Two times a day (BID) | ORAL | 0 refills | Status: AC

## 2023-06-22 LAB — URINE CULTURE
MICRO NUMBER:: 16478587
SPECIMEN QUALITY:: ADEQUATE

## 2023-11-24 DIAGNOSIS — E119 Type 2 diabetes mellitus without complications: Secondary | ICD-10-CM | POA: Diagnosis not present

## 2023-11-24 DIAGNOSIS — D519 Vitamin B12 deficiency anemia, unspecified: Secondary | ICD-10-CM | POA: Diagnosis not present

## 2023-11-24 DIAGNOSIS — E7800 Pure hypercholesterolemia, unspecified: Secondary | ICD-10-CM | POA: Diagnosis not present

## 2023-11-24 DIAGNOSIS — E559 Vitamin D deficiency, unspecified: Secondary | ICD-10-CM | POA: Diagnosis not present

## 2023-11-24 DIAGNOSIS — I10 Essential (primary) hypertension: Secondary | ICD-10-CM | POA: Diagnosis not present

## 2023-11-24 DIAGNOSIS — I679 Cerebrovascular disease, unspecified: Secondary | ICD-10-CM | POA: Diagnosis not present
